# Patient Record
Sex: Female | Born: 1935
Health system: Southern US, Community
[De-identification: ages and names within clinical notes are randomized; demographics above are authoritative.]

## PROBLEM LIST (undated history)

## (undated) DIAGNOSIS — I639 Cerebral infarction, unspecified: Secondary | ICD-10-CM

## (undated) DIAGNOSIS — Z9989 Dependence on other enabling machines and devices: Secondary | ICD-10-CM

## (undated) DIAGNOSIS — G5602 Carpal tunnel syndrome, left upper limb: Secondary | ICD-10-CM

## (undated) DIAGNOSIS — F32A Depression, unspecified: Secondary | ICD-10-CM

## (undated) DIAGNOSIS — G4733 Obstructive sleep apnea (adult) (pediatric): Secondary | ICD-10-CM

## (undated) DIAGNOSIS — Z8781 Personal history of (healed) traumatic fracture: Secondary | ICD-10-CM

## (undated) DIAGNOSIS — F329 Major depressive disorder, single episode, unspecified: Secondary | ICD-10-CM

## (undated) DIAGNOSIS — F0390 Unspecified dementia without behavioral disturbance: Secondary | ICD-10-CM

## (undated) DIAGNOSIS — Z86018 Personal history of other benign neoplasm: Secondary | ICD-10-CM

## (undated) DIAGNOSIS — Z8719 Personal history of other diseases of the digestive system: Secondary | ICD-10-CM

## (undated) DIAGNOSIS — M5090 Cervical disc disorder, unspecified, unspecified cervical region: Secondary | ICD-10-CM

## (undated) DIAGNOSIS — D3A010 Benign carcinoid tumor of the duodenum: Secondary | ICD-10-CM

## (undated) DIAGNOSIS — Z0000 Encounter for general adult medical examination without abnormal findings: Secondary | ICD-10-CM

## (undated) DIAGNOSIS — K5790 Diverticulosis of intestine, part unspecified, without perforation or abscess without bleeding: Secondary | ICD-10-CM

## (undated) DIAGNOSIS — N183 Chronic kidney disease, stage 3 unspecified: Secondary | ICD-10-CM

## (undated) DIAGNOSIS — E1140 Type 2 diabetes mellitus with diabetic neuropathy, unspecified: Secondary | ICD-10-CM

## (undated) DIAGNOSIS — Z973 Presence of spectacles and contact lenses: Secondary | ICD-10-CM

## (undated) DIAGNOSIS — E119 Type 2 diabetes mellitus without complications: Secondary | ICD-10-CM

## (undated) DIAGNOSIS — E785 Hyperlipidemia, unspecified: Secondary | ICD-10-CM

## (undated) DIAGNOSIS — R413 Other amnesia: Secondary | ICD-10-CM

## (undated) DIAGNOSIS — K219 Gastro-esophageal reflux disease without esophagitis: Secondary | ICD-10-CM

## (undated) DIAGNOSIS — F419 Anxiety disorder, unspecified: Secondary | ICD-10-CM

## (undated) DIAGNOSIS — I10 Essential (primary) hypertension: Secondary | ICD-10-CM

## (undated) DIAGNOSIS — M179 Osteoarthritis of knee, unspecified: Secondary | ICD-10-CM

## (undated) DIAGNOSIS — M1710 Unilateral primary osteoarthritis, unspecified knee: Secondary | ICD-10-CM

## (undated) DIAGNOSIS — M810 Age-related osteoporosis without current pathological fracture: Secondary | ICD-10-CM

## (undated) HISTORY — DX: Major depressive disorder, single episode, unspecified: F32.9

## (undated) HISTORY — DX: Benign carcinoid tumor of the duodenum: D3A.010

## (undated) HISTORY — DX: Other amnesia: R41.3

## (undated) HISTORY — DX: Gastro-esophageal reflux disease without esophagitis: K21.9

## (undated) HISTORY — DX: Anxiety disorder, unspecified: F41.9

## (undated) HISTORY — PX: OTHER SURGICAL HISTORY: SHX169

## (undated) HISTORY — DX: Depression, unspecified: F32.A

## (undated) HISTORY — PX: TOTAL KNEE ARTHROPLASTY: SHX125

## (undated) HISTORY — PX: REDUCTION MAMMAPLASTY: SUR839

## (undated) HISTORY — PX: ABDOMINAL HYSTERECTOMY: SHX81

## (undated) HISTORY — PX: TONSILLECTOMY: SUR1361

## (undated) HISTORY — DX: Essential (primary) hypertension: I10

## (undated) HISTORY — DX: Type 2 diabetes mellitus with diabetic neuropathy, unspecified: E11.40

## (undated) HISTORY — DX: Chronic kidney disease, stage 3 unspecified: N18.30

## (undated) HISTORY — DX: Unilateral primary osteoarthritis, unspecified knee: M17.10

## (undated) HISTORY — DX: Diverticulosis of intestine, part unspecified, without perforation or abscess without bleeding: K57.90

## (undated) HISTORY — DX: Cervical disc disorder, unspecified, unspecified cervical region: M50.90

## (undated) HISTORY — DX: Hyperlipidemia, unspecified: E78.5

## (undated) HISTORY — DX: Age-related osteoporosis without current pathological fracture: M81.0

## (undated) HISTORY — DX: Cerebral infarction, unspecified: I63.9

## (undated) HISTORY — DX: Encounter for general adult medical examination without abnormal findings: Z00.00

## (undated) HISTORY — DX: Osteoarthritis of knee, unspecified: M17.9

---
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
## 1988-12-19 HISTORY — PX: NASAL SINUS SURGERY: SHX719

## 1993-12-19 HISTORY — PX: BREAST REDUCTION SURGERY: SHX8

## 1998-04-09 ENCOUNTER — Ambulatory Visit: Admission: RE | Admit: 1998-04-09 | Discharge: 1998-04-09 | Payer: Self-pay | Admitting: Family Medicine

## 1998-10-20 ENCOUNTER — Ambulatory Visit (HOSPITAL_COMMUNITY): Admission: RE | Admit: 1998-10-20 | Discharge: 1998-10-20 | Payer: Self-pay | Admitting: Family Medicine

## 1998-10-20 ENCOUNTER — Encounter: Payer: Self-pay | Admitting: Family Medicine

## 1999-02-13 ENCOUNTER — Emergency Department (HOSPITAL_COMMUNITY): Admission: EM | Admit: 1999-02-13 | Discharge: 1999-02-13 | Payer: Self-pay | Admitting: Emergency Medicine

## 2000-05-02 ENCOUNTER — Encounter: Payer: Self-pay | Admitting: *Deleted

## 2000-05-02 ENCOUNTER — Encounter: Admission: RE | Admit: 2000-05-02 | Discharge: 2000-05-02 | Payer: Self-pay | Admitting: *Deleted

## 2000-10-12 ENCOUNTER — Encounter: Payer: Self-pay | Admitting: Family Medicine

## 2000-10-12 ENCOUNTER — Ambulatory Visit (HOSPITAL_COMMUNITY): Admission: RE | Admit: 2000-10-12 | Discharge: 2000-10-12 | Payer: Self-pay | Admitting: Family Medicine

## 2000-10-30 ENCOUNTER — Encounter: Admission: RE | Admit: 2000-10-30 | Discharge: 2000-10-30 | Payer: Self-pay | Admitting: Family Medicine

## 2000-10-30 ENCOUNTER — Encounter: Payer: Self-pay | Admitting: Family Medicine

## 2001-01-25 ENCOUNTER — Encounter: Payer: Self-pay | Admitting: Family Medicine

## 2001-05-04 ENCOUNTER — Encounter: Admission: RE | Admit: 2001-05-04 | Discharge: 2001-05-04 | Payer: Self-pay | Admitting: *Deleted

## 2001-05-04 ENCOUNTER — Encounter: Payer: Self-pay | Admitting: *Deleted

## 2001-06-04 ENCOUNTER — Other Ambulatory Visit: Admission: RE | Admit: 2001-06-04 | Discharge: 2001-06-04 | Payer: Self-pay

## 2002-02-01 ENCOUNTER — Encounter: Payer: Self-pay | Admitting: Internal Medicine

## 2002-05-10 ENCOUNTER — Encounter: Payer: Self-pay | Admitting: *Deleted

## 2002-05-10 ENCOUNTER — Encounter: Admission: RE | Admit: 2002-05-10 | Discharge: 2002-05-10 | Payer: Self-pay | Admitting: *Deleted

## 2003-05-26 ENCOUNTER — Encounter: Admission: RE | Admit: 2003-05-26 | Discharge: 2003-05-26 | Payer: Self-pay | Admitting: *Deleted

## 2003-05-26 ENCOUNTER — Encounter: Payer: Self-pay | Admitting: *Deleted

## 2003-06-10 ENCOUNTER — Encounter: Admission: RE | Admit: 2003-06-10 | Discharge: 2003-09-08 | Payer: Self-pay | Admitting: Family Medicine

## 2004-06-08 ENCOUNTER — Encounter: Admission: RE | Admit: 2004-06-08 | Discharge: 2004-06-08 | Payer: Self-pay | Admitting: Family Medicine

## 2004-10-19 ENCOUNTER — Ambulatory Visit: Payer: Self-pay | Admitting: Physical Medicine & Rehabilitation

## 2004-10-19 ENCOUNTER — Inpatient Hospital Stay (HOSPITAL_COMMUNITY): Admission: RE | Admit: 2004-10-19 | Discharge: 2004-10-22 | Payer: Self-pay | Admitting: Orthopaedic Surgery

## 2004-10-22 ENCOUNTER — Inpatient Hospital Stay
Admission: RE | Admit: 2004-10-22 | Discharge: 2004-10-27 | Payer: Self-pay | Admitting: Physical Medicine & Rehabilitation

## 2005-07-22 ENCOUNTER — Encounter: Admission: RE | Admit: 2005-07-22 | Discharge: 2005-07-22 | Payer: Self-pay | Admitting: Family Medicine

## 2005-08-05 ENCOUNTER — Encounter: Admission: RE | Admit: 2005-08-05 | Discharge: 2005-08-05 | Payer: Self-pay | Admitting: Family Medicine

## 2005-12-21 ENCOUNTER — Encounter: Admission: RE | Admit: 2005-12-21 | Discharge: 2005-12-21 | Payer: Self-pay | Admitting: Family Medicine

## 2006-01-03 ENCOUNTER — Ambulatory Visit (HOSPITAL_BASED_OUTPATIENT_CLINIC_OR_DEPARTMENT_OTHER): Admission: RE | Admit: 2006-01-03 | Discharge: 2006-01-03 | Payer: Self-pay | Admitting: Orthopaedic Surgery

## 2006-01-03 HISTORY — PX: KNEE ARTHROSCOPY W/ MENISCECTOMY: SHX1879

## 2006-03-30 ENCOUNTER — Encounter: Admission: RE | Admit: 2006-03-30 | Discharge: 2006-03-30 | Payer: Self-pay | Admitting: Family Medicine

## 2006-09-12 ENCOUNTER — Encounter: Admission: RE | Admit: 2006-09-12 | Discharge: 2007-06-25 | Payer: Self-pay | Admitting: Family Medicine

## 2006-11-29 ENCOUNTER — Encounter: Payer: Self-pay | Admitting: Family Medicine

## 2006-11-29 LAB — CONVERTED CEMR LAB: TSH: 1.54 microintl units/mL

## 2007-02-23 ENCOUNTER — Encounter: Payer: Self-pay | Admitting: Family Medicine

## 2007-02-23 LAB — CONVERTED CEMR LAB
AST: 24 units/L
Alkaline Phosphatase: 79 units/L
CO2: 33 meq/L
Chloride: 101 meq/L
Cholesterol: 163 mg/dL
Creatinine, Ser: 1 mg/dL
Glucose, Bld: 96 mg/dL
Microalb, Ur: NEGATIVE mg/dL

## 2007-06-25 ENCOUNTER — Emergency Department (HOSPITAL_COMMUNITY): Admission: EM | Admit: 2007-06-25 | Discharge: 2007-06-25 | Payer: Self-pay | Admitting: Emergency Medicine

## 2007-07-04 ENCOUNTER — Encounter: Payer: Self-pay | Admitting: Family Medicine

## 2007-07-05 ENCOUNTER — Ambulatory Visit: Payer: Self-pay | Admitting: Family Medicine

## 2007-07-05 DIAGNOSIS — I10 Essential (primary) hypertension: Secondary | ICD-10-CM | POA: Insufficient documentation

## 2007-07-06 ENCOUNTER — Encounter: Payer: Self-pay | Admitting: Family Medicine

## 2007-07-10 ENCOUNTER — Telehealth: Payer: Self-pay | Admitting: Family Medicine

## 2007-07-11 ENCOUNTER — Encounter: Payer: Self-pay | Admitting: Family Medicine

## 2007-07-11 DIAGNOSIS — G43009 Migraine without aura, not intractable, without status migrainosus: Secondary | ICD-10-CM | POA: Insufficient documentation

## 2007-07-16 ENCOUNTER — Ambulatory Visit: Payer: Self-pay | Admitting: Family Medicine

## 2007-07-16 DIAGNOSIS — F329 Major depressive disorder, single episode, unspecified: Secondary | ICD-10-CM | POA: Insufficient documentation

## 2007-08-06 ENCOUNTER — Ambulatory Visit: Payer: Self-pay | Admitting: Family Medicine

## 2007-08-07 ENCOUNTER — Telehealth: Payer: Self-pay | Admitting: Family Medicine

## 2007-08-07 ENCOUNTER — Telehealth (INDEPENDENT_AMBULATORY_CARE_PROVIDER_SITE_OTHER): Payer: Self-pay | Admitting: *Deleted

## 2007-08-07 ENCOUNTER — Encounter: Payer: Self-pay | Admitting: Family Medicine

## 2007-08-07 LAB — CONVERTED CEMR LAB: Vitamin B-12: 720 pg/mL (ref 211–911)

## 2007-08-16 ENCOUNTER — Ambulatory Visit: Payer: Self-pay | Admitting: Internal Medicine

## 2007-09-17 ENCOUNTER — Encounter: Payer: Self-pay | Admitting: Family Medicine

## 2007-09-17 ENCOUNTER — Ambulatory Visit: Payer: Self-pay | Admitting: Internal Medicine

## 2007-09-17 ENCOUNTER — Encounter: Payer: Self-pay | Admitting: Internal Medicine

## 2007-09-24 ENCOUNTER — Ambulatory Visit: Payer: Self-pay | Admitting: Family Medicine

## 2007-09-24 DIAGNOSIS — M5030 Other cervical disc degeneration, unspecified cervical region: Secondary | ICD-10-CM | POA: Insufficient documentation

## 2007-09-24 DIAGNOSIS — K219 Gastro-esophageal reflux disease without esophagitis: Secondary | ICD-10-CM | POA: Insufficient documentation

## 2007-09-24 DIAGNOSIS — Z96659 Presence of unspecified artificial knee joint: Secondary | ICD-10-CM | POA: Insufficient documentation

## 2007-09-26 ENCOUNTER — Encounter: Payer: Self-pay | Admitting: Family Medicine

## 2007-10-01 ENCOUNTER — Encounter: Admission: RE | Admit: 2007-10-01 | Discharge: 2007-11-03 | Payer: Self-pay | Admitting: Family Medicine

## 2007-10-09 ENCOUNTER — Telehealth: Payer: Self-pay | Admitting: Family Medicine

## 2007-10-15 ENCOUNTER — Telehealth: Payer: Self-pay | Admitting: Family Medicine

## 2007-10-24 ENCOUNTER — Encounter: Payer: Self-pay | Admitting: Family Medicine

## 2007-10-26 ENCOUNTER — Ambulatory Visit: Payer: Self-pay | Admitting: Family Medicine

## 2007-10-31 ENCOUNTER — Inpatient Hospital Stay (HOSPITAL_COMMUNITY): Admission: RE | Admit: 2007-10-31 | Discharge: 2007-11-05 | Payer: Self-pay | Admitting: Orthopedic Surgery

## 2007-11-03 ENCOUNTER — Encounter: Payer: Self-pay | Admitting: Family Medicine

## 2007-11-06 ENCOUNTER — Telehealth: Payer: Self-pay | Admitting: Family Medicine

## 2007-11-16 ENCOUNTER — Ambulatory Visit: Payer: Self-pay | Admitting: Family Medicine

## 2007-11-17 LAB — CONVERTED CEMR LAB: TSH: 1.668 microintl units/mL (ref 0.350–5.50)

## 2007-11-19 ENCOUNTER — Telehealth: Payer: Self-pay | Admitting: Family Medicine

## 2007-11-20 ENCOUNTER — Encounter: Admission: RE | Admit: 2007-11-20 | Discharge: 2007-11-20 | Payer: Self-pay | Admitting: Family Medicine

## 2007-11-22 DIAGNOSIS — E041 Nontoxic single thyroid nodule: Secondary | ICD-10-CM | POA: Insufficient documentation

## 2007-11-27 ENCOUNTER — Ambulatory Visit: Payer: Self-pay | Admitting: Family Medicine

## 2007-11-29 LAB — CONVERTED CEMR LAB
BUN: 24 mg/dL — ABNORMAL HIGH (ref 6–23)
Creatinine, Ser: 1.08 mg/dL (ref 0.40–1.20)
MCV: 88.8 fL (ref 78.0–100.0)
Platelets: 334 10*3/uL (ref 150–400)
Potassium: 4.5 meq/L (ref 3.5–5.3)
RDW: 14.5 % (ref 11.5–15.5)
Sodium: 144 meq/L (ref 135–145)

## 2007-12-07 ENCOUNTER — Ambulatory Visit: Payer: Self-pay | Admitting: Cardiology

## 2007-12-20 ENCOUNTER — Ambulatory Visit: Payer: Self-pay | Admitting: Cardiology

## 2007-12-21 ENCOUNTER — Ambulatory Visit: Payer: Self-pay

## 2007-12-21 ENCOUNTER — Encounter: Payer: Self-pay | Admitting: Cardiology

## 2007-12-21 ENCOUNTER — Encounter: Payer: Self-pay | Admitting: Family Medicine

## 2007-12-21 HISTORY — PX: TRANSTHORACIC ECHOCARDIOGRAM: SHX275

## 2007-12-21 HISTORY — PX: CARDIOVASCULAR STRESS TEST: SHX262

## 2008-01-07 ENCOUNTER — Telehealth (INDEPENDENT_AMBULATORY_CARE_PROVIDER_SITE_OTHER): Payer: Self-pay | Admitting: *Deleted

## 2008-01-09 ENCOUNTER — Ambulatory Visit: Payer: Self-pay | Admitting: Cardiology

## 2008-01-10 ENCOUNTER — Encounter: Payer: Self-pay | Admitting: Family Medicine

## 2008-01-17 ENCOUNTER — Ambulatory Visit: Payer: Self-pay | Admitting: Family Medicine

## 2008-01-17 DIAGNOSIS — E785 Hyperlipidemia, unspecified: Secondary | ICD-10-CM | POA: Insufficient documentation

## 2008-02-17 ENCOUNTER — Encounter: Payer: Self-pay | Admitting: Family Medicine

## 2008-03-05 ENCOUNTER — Encounter: Payer: Self-pay | Admitting: Family Medicine

## 2008-03-05 LAB — CONVERTED CEMR LAB
AST: 19 units/L (ref 0–37)
Albumin: 4.8 g/dL (ref 3.5–5.2)
BUN: 18 mg/dL (ref 6–23)
CO2: 25 meq/L (ref 19–32)
Creatinine, Ser: 0.87 mg/dL (ref 0.40–1.20)
Glucose, Bld: 106 mg/dL — ABNORMAL HIGH (ref 70–99)
LDL Cholesterol: 99 mg/dL (ref 0–99)
Total Bilirubin: 0.8 mg/dL (ref 0.3–1.2)
Total Protein: 7 g/dL (ref 6.0–8.3)

## 2008-03-06 ENCOUNTER — Encounter: Payer: Self-pay | Admitting: Family Medicine

## 2008-03-18 DIAGNOSIS — D131 Benign neoplasm of stomach: Secondary | ICD-10-CM | POA: Insufficient documentation

## 2008-04-08 ENCOUNTER — Ambulatory Visit: Payer: Self-pay | Admitting: Family Medicine

## 2008-06-03 ENCOUNTER — Ambulatory Visit: Payer: Self-pay | Admitting: Family Medicine

## 2008-06-05 ENCOUNTER — Ambulatory Visit: Payer: Self-pay | Admitting: Family Medicine

## 2008-09-04 ENCOUNTER — Ambulatory Visit: Payer: Self-pay | Admitting: Family Medicine

## 2008-09-15 ENCOUNTER — Encounter: Admission: RE | Admit: 2008-09-15 | Discharge: 2008-09-15 | Payer: Self-pay | Admitting: Family Medicine

## 2008-09-16 ENCOUNTER — Ambulatory Visit: Payer: Self-pay | Admitting: Family Medicine

## 2008-09-16 LAB — CONVERTED CEMR LAB
Bilirubin Urine: NEGATIVE
Glucose, Urine, Semiquant: NEGATIVE
Nitrite: NEGATIVE
WBC Urine, dipstick: NEGATIVE
pH: 5

## 2008-09-17 ENCOUNTER — Encounter: Payer: Self-pay | Admitting: Family Medicine

## 2009-01-22 ENCOUNTER — Ambulatory Visit: Payer: Self-pay | Admitting: Family Medicine

## 2009-01-22 LAB — CONVERTED CEMR LAB: Hgb A1c MFr Bld: 5.9 %

## 2009-03-05 ENCOUNTER — Ambulatory Visit: Payer: Self-pay | Admitting: Family Medicine

## 2009-03-05 LAB — CONVERTED CEMR LAB
Blood Glucose, Fasting: 142 mg/dL
Hgb A1c MFr Bld: 6 %

## 2009-03-06 ENCOUNTER — Encounter: Payer: Self-pay | Admitting: Family Medicine

## 2009-03-09 LAB — CONVERTED CEMR LAB
AST: 24 units/L (ref 0–37)
BUN: 20 mg/dL (ref 6–23)
CO2: 26 meq/L (ref 19–32)
Cholesterol: 166 mg/dL (ref 0–200)
Creatinine, Ser: 0.91 mg/dL (ref 0.40–1.20)
Glucose, Bld: 113 mg/dL — ABNORMAL HIGH (ref 70–99)
LDL Cholesterol: 94 mg/dL (ref 0–99)
MCV: 87.7 fL (ref 78.0–100.0)
Sodium: 144 meq/L (ref 135–145)
Total CHOL/HDL Ratio: 3
Total Protein: 6.7 g/dL (ref 6.0–8.3)
Triglycerides: 83 mg/dL (ref <150)

## 2009-04-22 ENCOUNTER — Ambulatory Visit: Payer: Self-pay | Admitting: Family Medicine

## 2009-04-22 DIAGNOSIS — M5430 Sciatica, unspecified side: Secondary | ICD-10-CM | POA: Insufficient documentation

## 2009-04-22 LAB — CONVERTED CEMR LAB: Blood Glucose, AC Bkfst: 136 mg/dL

## 2009-04-23 ENCOUNTER — Encounter: Admission: RE | Admit: 2009-04-23 | Discharge: 2009-04-23 | Payer: Self-pay | Admitting: Sports Medicine

## 2009-04-28 ENCOUNTER — Telehealth (INDEPENDENT_AMBULATORY_CARE_PROVIDER_SITE_OTHER): Payer: Self-pay | Admitting: *Deleted

## 2009-05-27 ENCOUNTER — Ambulatory Visit: Payer: Self-pay | Admitting: Family Medicine

## 2009-06-02 ENCOUNTER — Ambulatory Visit: Payer: Self-pay | Admitting: Family Medicine

## 2009-06-02 DIAGNOSIS — E119 Type 2 diabetes mellitus without complications: Secondary | ICD-10-CM | POA: Insufficient documentation

## 2009-06-10 ENCOUNTER — Telehealth (INDEPENDENT_AMBULATORY_CARE_PROVIDER_SITE_OTHER): Payer: Self-pay | Admitting: *Deleted

## 2009-06-24 ENCOUNTER — Telehealth (INDEPENDENT_AMBULATORY_CARE_PROVIDER_SITE_OTHER): Payer: Self-pay | Admitting: *Deleted

## 2009-06-25 ENCOUNTER — Encounter: Admission: RE | Admit: 2009-06-25 | Discharge: 2009-06-25 | Payer: Self-pay | Admitting: Sports Medicine

## 2009-07-09 ENCOUNTER — Encounter: Admission: RE | Admit: 2009-07-09 | Discharge: 2009-07-09 | Payer: Self-pay | Admitting: Sports Medicine

## 2009-07-20 ENCOUNTER — Telehealth: Payer: Self-pay | Admitting: Family Medicine

## 2009-08-03 ENCOUNTER — Telehealth (INDEPENDENT_AMBULATORY_CARE_PROVIDER_SITE_OTHER): Payer: Self-pay | Admitting: *Deleted

## 2009-08-04 ENCOUNTER — Telehealth (INDEPENDENT_AMBULATORY_CARE_PROVIDER_SITE_OTHER): Payer: Self-pay | Admitting: *Deleted

## 2009-08-12 ENCOUNTER — Encounter: Admission: RE | Admit: 2009-08-12 | Discharge: 2009-09-25 | Payer: Self-pay | Admitting: Sports Medicine

## 2009-08-26 ENCOUNTER — Encounter: Payer: Self-pay | Admitting: Family Medicine

## 2009-09-04 ENCOUNTER — Ambulatory Visit: Payer: Self-pay | Admitting: Family Medicine

## 2009-09-05 ENCOUNTER — Encounter: Payer: Self-pay | Admitting: Family Medicine

## 2009-09-07 LAB — CONVERTED CEMR LAB
Creatinine, Urine: 180.1 mg/dL
Ferritin: 112 ng/mL (ref 10–291)
Folate: >20 ng/mL
Hgb A1c MFr Bld: 6.5 % — ABNORMAL HIGH (ref 4.6–6.1)
Microalb, Ur: 1.06 mg/dL (ref 0.00–1.89)
RDW: 15.1 % (ref 11.5–15.5)

## 2009-10-06 ENCOUNTER — Telehealth: Payer: Self-pay | Admitting: Family Medicine

## 2009-10-08 ENCOUNTER — Ambulatory Visit: Payer: Self-pay | Admitting: Family Medicine

## 2009-10-27 ENCOUNTER — Encounter: Admission: RE | Admit: 2009-10-27 | Discharge: 2009-10-27 | Payer: Self-pay | Admitting: Family Medicine

## 2009-10-28 LAB — HM MAMMOGRAPHY: HM Mammogram: NORMAL

## 2009-11-06 ENCOUNTER — Ambulatory Visit: Payer: Self-pay | Admitting: Family Medicine

## 2010-01-06 ENCOUNTER — Ambulatory Visit: Payer: Self-pay | Admitting: Family Medicine

## 2010-01-06 LAB — CONVERTED CEMR LAB: Hgb A1c MFr Bld: 6.1 %

## 2010-01-13 ENCOUNTER — Ambulatory Visit (HOSPITAL_COMMUNITY): Payer: Self-pay | Admitting: Psychiatry

## 2010-04-12 ENCOUNTER — Ambulatory Visit: Payer: Self-pay | Admitting: Family Medicine

## 2010-04-12 DIAGNOSIS — R5383 Other fatigue: Secondary | ICD-10-CM

## 2010-04-12 DIAGNOSIS — R5381 Other malaise: Secondary | ICD-10-CM | POA: Insufficient documentation

## 2010-04-13 LAB — CONVERTED CEMR LAB
AST: 22 units/L (ref 0–37)
Alkaline Phosphatase: 85 units/L (ref 39–117)
Basophils Relative: 0 % (ref 0–1)
CO2: 27 meq/L (ref 19–32)
Calcium: 9.9 mg/dL (ref 8.4–10.5)
Chloride: 104 meq/L (ref 96–112)
Cholesterol: 140 mg/dL (ref 0–200)
Eosinophils Absolute: 0.3 10*3/uL (ref 0.0–0.7)
Eosinophils Relative: 3 % (ref 0–5)
Glucose, Bld: 118 mg/dL — ABNORMAL HIGH (ref 70–99)
HCT: 41.2 % (ref 36.0–46.0)
HDL: 54 mg/dL (ref >39)
Hemoglobin: 13.1 g/dL (ref 12.0–15.0)
LDL Cholesterol: 72 mg/dL (ref 0–99)
MCV: 89 fL (ref 78.0–100.0)
Monocytes Absolute: 0.6 10*3/uL (ref 0.1–1.0)
Platelets: 243 10*3/uL (ref 150–400)
Potassium: 3.9 meq/L (ref 3.5–5.3)
RBC: 4.63 M/uL (ref 3.87–5.11)
RDW: 14.2 % (ref 11.5–15.5)
Total Bilirubin: 0.8 mg/dL (ref 0.3–1.2)
Total CHOL/HDL Ratio: 2.6
Triglycerides: 72 mg/dL (ref <150)
VLDL: 14 mg/dL (ref 0–40)
WBC: 7.9 10*3/uL (ref 4.0–10.5)

## 2010-08-10 ENCOUNTER — Ambulatory Visit: Payer: Self-pay | Admitting: Family Medicine

## 2010-08-24 LAB — HM DIABETES EYE EXAM

## 2010-09-08 ENCOUNTER — Encounter: Payer: Self-pay | Admitting: Family Medicine

## 2010-10-13 ENCOUNTER — Ambulatory Visit: Payer: Self-pay | Admitting: Family Medicine

## 2010-12-03 ENCOUNTER — Ambulatory Visit: Payer: Self-pay | Admitting: Family Medicine

## 2010-12-17 LAB — CONVERTED CEMR LAB: Albumin/Creatinine Ratio, Urine, POC: <30

## 2011-01-09 ENCOUNTER — Encounter: Payer: Self-pay | Admitting: General Surgery

## 2011-01-09 ENCOUNTER — Encounter: Payer: Self-pay | Admitting: Family Medicine

## 2011-01-10 ENCOUNTER — Encounter: Payer: Self-pay | Admitting: Family Medicine

## 2011-01-10 ENCOUNTER — Encounter: Payer: Self-pay | Admitting: Sports Medicine

## 2011-01-18 NOTE — Assessment & Plan Note (Signed)
Summary: f/u diabetes   Vital Signs:  Patient profile:   75 year old female Height:      65.25 inches Weight:      184 pounds BMI:     30.49 O2 Sat:      96 % on Room air Temp:     98.3 degrees F oral Pulse rate:   102 / minute BP sitting:   128 / 77  (right arm) Cuff size:   large  Vitals Entered By: Payton Spark CMA (January 06, 2010 11:23 AM)  O2 Flow:  Room air CC: F/U DM   Primary Care Provider:  Seymour Bars DO  CC:  F/U DM.  History of Present Illness: 75 yo WF presents for T2DM diagnosed 05-2009.  She is on Metformin 500 mg in the AM.  Her fasting sugars are running in the 90s.  Her diet has improved and she lost 8 lbs (due to added stress w/ her sister's failing health).  Her mood has improved with higher dose of Wellbutrin.  She has been getting out of the house more and is doing less emotional eating.     Allergies: 1)  Ace Inhibitors 2)  Actos (Pioglitazone Hcl)  Past History:  Past Medical History: HTN DM 05-2009 high cholestrol L knee OA- DR Yisroel Ramming EKG 2/05 DXA 10/01 G2P2 depression GERD (Dr Juanda Chance), hiatal hernia Mitral valve regurg Diverticulosisis asthma cervical disc dz pernicious anemia OSA-- non compliant with CPAP diabetic neuropathy optho: Dr Jen Mow ([facility]) cspine radiculopathy IBS  Past Surgical History: Reviewed history from 03/18/2008 and no changes required. Hysterectomy for fibroids breast reduction G2P2 R knee replacement 11/05- Dr Yisroel Ramming L TKR Tonsillectomy Appendectomy Sinus surgery  Social History: Reviewed history from 07/05/2007 and no changes required. Retired from [employer].  Her younger sister with MS lives with her.   2 sons, one in [place], 1 in Paediatric nurse.  Never smoked.  Denies ETOH. no exercise due to knee pain.  HS diploma.  Divorced, no relationship.  Review of Systems      See HPI  Physical Exam  General:  alert, well-developed, well-nourished, well-hydrated, and overweight-appearing.     Head:  normocephalic and atraumatic.   Eyes:  pupils equal, pupils round, and pupils reactive to light.   Nose:  no nasal discharge.   Mouth:  pharynx pink and moist.   Neck:  no masses.   Lungs:  Normal respiratory effort, chest expands symmetrically. Lungs are clear to auscultation, no crackles or wheezes. Heart:  2/6 systolic murmurnormal rate and regular rhythm.   Extremities:  trace LE edema Skin:  color normal.   Cervical Nodes:  No lymphadenopathy noted Psych:  in better spirits  Diabetes Management Exam:    Foot Exam (with socks and/or shoes not present):       Sensory-Pinprick/Light touch:          Left medial foot (L-4): absent          Left dorsal foot (L-5): absent          Left lateral foot (S-1): absent          Right medial foot (L-4): absent          Right dorsal foot (L-5): absent          Right lateral foot (S-1): absent       Sensory-Monofilament:          Left foot: absent          Right foot: absent  Impression & Recommendations:  Problem # 1:  DIABETES MELLITUS, TYPE II, CONTROLLED (ICD-250.00) A1C perfect.  Continue current meds, working on diabetic diet, exercise, wt loss.  Stay on metformin.  FLP, CMP due after March. Her updated medication list for this problem includes:    Benicar Hct 40-12.5 Mg Tabs (Olmesartan medoxomil-hctz) .Marland Kitchen... 1 tab by mouth daily    Adult Aspirin Low Strength 81 Mg Tbdp (Aspirin) .Marland Kitchen... Take 1 tablet by mouth once a day    Metformin Hcl 500 Mg Tabs (Metformin hcl) .Marland Kitchen... Take 1 tablet by mouth once a day in the am.  Orders: Fingerstick (04540) Hemoglobin A1C (83036)  Labs Reviewed: Creat: 0.91 (03/06/2009)     Last Eye Exam: no retinopathy Dr Weston Anna (08/19/2009) Reviewed HgBA1c results: 6.1 (01/06/2010)  6.5 (09/05/2009)  Problem # 2:  DEPRESSION, RECURRENT (ICD-311) Assessment: Improved Symptomatically improved, losing wt and being more active despite home stressors w/ her sister. Continue current treatment  plan. Her updated medication list for this problem includes:    Cymbalta 60 Mg Cpep (Duloxetine hcl) .Marland Kitchen... 1 tab by mouth daily    Alprazolam 0.25 Mg Tabs (Alprazolam) .Marland Kitchen... 1 tab by mouth two times a day as needed anxiety    Wellbutrin Xl 300 Mg Xr24h-tab (Bupropion hcl) .Marland Kitchen... 1 tab by mouth qam  Orders: Psychology Referral (Psychology)  Problem # 3:  HYPERTENSION, BENIGN ESSENTIAL (ICD-401.1) BP at goal.  Continue current meds.  Labs due next visit. Her updated medication list for this problem includes:    Benicar Hct 40-12.5 Mg Tabs (Olmesartan medoxomil-hctz) .Marland Kitchen... 1 tab by mouth daily    Norvasc 5 Mg Tabs (Amlodipine besylate) .Marland Kitchen... 1 tab by mouth daily  BP today: 128/77 Prior BP: 127/73 (11/06/2009)  Prior 10 Yr Risk Heart Disease: 8 % (09/04/2008)  Labs Reviewed: K+: 4.8 (03/06/2009) Creat: : 0.91 (03/06/2009)   Chol: 166 (03/06/2009)   HDL: 55 (03/06/2009)   LDL: 94 (03/06/2009)   TG: 83 (03/06/2009)  Complete Medication List: 1)  Lipitor 20 Mg Tabs (Atorvastatin calcium) .... Take 1 tablet by mouth once a day 2)  Seroquel 100 Mg Tabs (Quetiapine fumarate) .... 0.5 tab by mouth qhs 3)  Cymbalta 60 Mg Cpep (Duloxetine hcl) .Marland Kitchen.. 1 tab by mouth daily 4)  Alprazolam 0.25 Mg Tabs (Alprazolam) .Marland Kitchen.. 1 tab by mouth two times a day as needed anxiety 5)  Benicar Hct 40-12.5 Mg Tabs (Olmesartan medoxomil-hctz) .Marland Kitchen.. 1 tab by mouth daily 6)  Adult Aspirin Low Strength 81 Mg Tbdp (Aspirin) .... Take 1 tablet by mouth once a day 7)  Celebrex 200 Mg Caps (Celecoxib) .Marland Kitchen.. 1 tab by mouth once daily with food 8)  Norvasc 5 Mg Tabs (Amlodipine besylate) .Marland Kitchen.. 1 tab by mouth daily 9)  Metanx 2.8-25-2 Mg Tabs (L-methylfolate-b6-b12) .... 2 tabs by mouth daily 10)  Metformin Hcl 500 Mg Tabs (Metformin hcl) .... Take 1 tablet by mouth once a day in the am. 11)  Prilosec Otc 20 Mg Tbec (Omeprazole magnesium) .Marland Kitchen.. 1 tab by mouth daily 12)  Wellbutrin Xl 300 Mg Xr24h-tab (Bupropion hcl) .Marland Kitchen.. 1 tab by  mouth qam  Patient Instructions: 1)  Counseling referral added for downstairs. 2)  Stay on current meds. 3)  Sugars look great!  Keep working on diabetic diet, adding exercise.   4)  Recheck in 3 months. Prescriptions: NORVASC 5 MG  TABS (AMLODIPINE BESYLATE) 1 tab by mouth daily  #30 x 6   Entered and Authorized by:   Seymour Bars DO  Signed by:   Seymour Bars DO on 01/06/2010   Method used:   [facility] to        [id number]       [address]       [place], [place]  [address]       Ph: [phone] or [phone]       Fax: [phone]   RxID:   [id number] CYMBALTA 60 MG  CPEP (DULOXETINE HCL) 1 tab by mouth daily  #30 x 6   Entered and Authorized by:   Seymour Bars DO   Signed by:   Seymour Bars DO on 01/06/2010   Method used:   [facility] to        [id number]       [address] [place], [place]  [address]       Ph: [phone] or [phone]       Fax: [phone]   RxID:   [id number] METFORMIN HCL 500 MG TABS (METFORMIN HCL) Take 1 tablet by mouth once a day in the AM.  #30 x 6   Entered and Authorized by:   Seymour Bars DO   Signed by:   Seymour Bars DO on 01/06/2010   Method used:   [facility]        [id number]       [address] [place], [place]  [address]       Ph: [phone] or [phone]       Fax: [phone]   RxID:   [id number] WELLBUTRIN XL 300 MG XR24H-TAB (BUPROPION HCL) 1 tab by mouth qAM  #30 x 3   Entered and Authorized by:   Seymour Bars DO   Signed by:   Seymour Bars DO on 01/06/2010   Method used:   [facility]        [id number]       [address] [place], [place]  [address]       Ph: [phone] or [phone]       Fax: [phone]   RxID:   [id number]   Laboratory Results   Blood Tests     HGBA1C: 6.1%   (Normal Range: Non-Diabetic - 3-6%   Control Diabetic  - 6-8%)

## 2011-01-18 NOTE — Assessment & Plan Note (Signed)
Summary: flu shot - jr  Nurse Visit   Allergies: 1)  ! Wellbutrin 2)  Ace Inhibitors 3)  Actos (Pioglitazone Hcl)  Immunizations Administered:  Influenza Vaccine # 1:    Vaccine Type: Fluvax MCR    Site: right deltoid    Mfr: GlaxoSmithKline    Dose: 0.5 ml    Route: IM    Given by: Sue Lush McCrimmon CMA, (AAMA)    Exp. Date: 06/18/2011    Lot #: [id number]    VIS given: 07/13/10 version given October 13, 2010.  Flu Vaccine Consent Questions:    Do you have a history of severe allergic reactions to this vaccine? no    Any prior history of allergic reactions to egg and/or gelatin? no    Do you have a sensitivity to the preservative Thimersol? no    Do you have a past history of Guillan-Barre Syndrome? no    Do you currently have an acute febrile illness? no    Have you ever had a severe reaction to latex? no    Vaccine information given and explained to patient? no    Are you currently pregnant? no  Orders Added: 1)  Influenza Vaccine MCR [00025] 2)  Admin 1st Vaccine [[address]]

## 2011-01-18 NOTE — Assessment & Plan Note (Signed)
Summary: f/u DM   Vital Signs:  Patient profile:   75 year old female Height:      65.25 inches Weight:      183 pounds BMI:     30.33 O2 Sat:      96 % on Room air Pulse rate:   98 / minute BP sitting:   120 / 65  (left arm) Cuff size:   large  Vitals Entered By: Payton Spark CMA (August 10, 2010 10:19 AM)  O2 Flow:  Room air CC: 4 mo f/u DM   Primary Care Provider:  Seymour Bars DO  CC:  4 mo f/u DM.  History of Present Illness: 75 yo WF presents for f/u T2DM.  She was increased on her Metformin to 500 mg two times a day from once a day.  She is feeling better.  Her mood has improved with adding Vit D.  She is taking all of her anti depressants and feels well on them. She took a vacation and says that this boosted her mood.  Her A1C has improved from 6.4--> 6.2.  Her eye exam is due in Sept.  Her AM sugars are in the low 110s.  Denies any low sugars.  She has fair control of her diet.  Denies paresthesias but has a hx of neuropathy.  She denies need for RF on meds or any problems iwth chest pain or SOB.      Current Medications (verified): 1)  Lipitor 20 Mg  Tabs (Atorvastatin Calcium) .... Take 1 Tablet By Mouth Once A Day 2)  Seroquel 50 Mg Tabs (Quetiapine Fumarate) .Marland Kitchen.. 1 Tab By Mouth Qpm 3)  Cymbalta 60 Mg  Cpep (Duloxetine Hcl) .Marland Kitchen.. 1 Tab By Mouth Daily 4)  Alprazolam 0.25 Mg  Tabs (Alprazolam) .Marland Kitchen.. 1 Tab By Mouth Two Times A Day As Needed Anxiety 5)  Benicar Hct 40-12.5 Mg Tabs (Olmesartan Medoxomil-Hctz) .Marland Kitchen.. 1 Tab By Mouth Daily 6)  Adult Aspirin Low Strength 81 Mg  Tbdp (Aspirin) .... Take 1 Tablet By Mouth Once A Day 7)  Norvasc 5 Mg  Tabs (Amlodipine Besylate) .Marland Kitchen.. 1 Tab By Mouth Daily 8)  Metanx 2.8-25-2 Mg Tabs (L-Methylfolate-B6-B12) .... 2 Tabs By Mouth Daily 9)  Prilosec Otc 20 Mg Tbec (Omeprazole Magnesium) .Marland Kitchen.. 1 Tab By Mouth Daily 10)  Vitamin D  Allergies (verified): 1)  ! Wellbutrin 2)  Ace Inhibitors 3)  Actos (Pioglitazone Hcl)  Past  History:  Past Medical History: Reviewed history from 01/06/2010 and no changes required. HTN DM 05-2009 high cholestrol L knee OA- DR Yisroel Ramming EKG 2/05 DXA 10/01 G2P2 depression GERD (Dr Juanda Chance), hiatal hernia Mitral valve regurg Diverticulosisis asthma cervical disc dz pernicious anemia OSA-- non compliant with CPAP diabetic neuropathy optho: Dr Jen Mow ([facility]) cspine radiculopathy IBS  Past Surgical History: Reviewed history from 03/18/2008 and no changes required. Hysterectomy for fibroids breast reduction G2P2 R knee replacement 11/05- Dr Yisroel Ramming L TKR Tonsillectomy Appendectomy Sinus surgery  Social History: Reviewed history from 07/05/2007 and no changes required. Retired from [employer].  Her younger sister with MS lives with her.   2 sons, one in [place], 1 in Paediatric nurse.  Never smoked.  Denies ETOH. no exercise due to knee pain.  HS diploma.  Divorced, no relationship.  Review of Systems      See HPI  Physical Exam  General:  alert, well-developed, well-nourished, and well-hydrated.   Head:  normocephalic and atraumatic.   Eyes:  pupils equal, pupils round, and pupils reactive  to light.   Mouth:  pharynx pink and moist.   Neck:  no masses.   Lungs:  Normal respiratory effort, chest expands symmetrically. Lungs are clear to auscultation, no crackles or wheezes. Heart:  2/6 systolic murmurnormal rate and regular rhythm.   Extremities:  no LE edema Skin:  color normal.   Cervical Nodes:  No lymphadenopathy noted Psych:  good eye contact, not anxious appearing, and not depressed appearing.     Impression & Recommendations:  Problem # 1:  DIABETES MELLITUS, TYPE II, CONTROLLED (ICD-250.00) Well controlled with A1C of 6.2.  Continue Metformin at 500 mg two times a day.  Labs updated in April 2011.  Umicro, monofilament and eye exam all UTD.  Will RTC for a flu shot in October and f/u with me in 4 mos.l The following medications were removed from  the medication list:    Metformin Hcl 500 Mg Tabs (Metformin hcl) .Marland Kitchen... 1 tab by mouth bid Her updated medication list for this problem includes:    Benicar Hct 40-12.5 Mg Tabs (Olmesartan medoxomil-hctz) .Marland Kitchen... 1 tab by mouth daily    Adult Aspirin Low Strength 81 Mg Tbdp (Aspirin) .Marland Kitchen... Take 1 tablet by mouth once a day  Orders: Fingerstick (36416) Hgb A1C (04540JW)  Problem # 2:  DEPRESSION, RECURRENT (ICD-311) Assessment: Improved She seems much brighter today and reports the best mood she's had in a while.  Continue current meds.   Her updated medication list for this problem includes:    Cymbalta 60 Mg Cpep (Duloxetine hcl) .Marland Kitchen... 1 tab by mouth daily    Alprazolam 0.25 Mg Tabs (Alprazolam) .Marland Kitchen... 1 tab by mouth two times a day as needed anxiety  Complete Medication List: 1)  Lipitor 20 Mg Tabs (Atorvastatin calcium) .... Take 1 tablet by mouth once a day 2)  Seroquel 50 Mg Tabs (Quetiapine fumarate) .Marland Kitchen.. 1 tab by mouth qpm 3)  Cymbalta 60 Mg Cpep (Duloxetine hcl) .Marland Kitchen.. 1 tab by mouth daily 4)  Alprazolam 0.25 Mg Tabs (Alprazolam) .Marland Kitchen.. 1 tab by mouth two times a day as needed anxiety 5)  Benicar Hct 40-12.5 Mg Tabs (Olmesartan medoxomil-hctz) .Marland Kitchen.. 1 tab by mouth daily 6)  Adult Aspirin Low Strength 81 Mg Tbdp (Aspirin) .... Take 1 tablet by mouth once a day 7)  Norvasc 5 Mg Tabs (Amlodipine besylate) .Marland Kitchen.. 1 tab by mouth daily 8)  Metanx 2.8-25-2 Mg Tabs (L-methylfolate-b6-b12) .... 2 tabs by mouth daily 9)  Prilosec Otc 20 Mg Tbec (Omeprazole magnesium) .Marland Kitchen.. 1 tab by mouth daily 10)  Vitamin D   Patient Instructions: 1)  A1C 6.2= great.   2)  Continue current meds. 3)  Return for a flu shot in 2 mos (nursse visit) and f/u diabetes in 4 mos.

## 2011-01-18 NOTE — Assessment & Plan Note (Signed)
Summary: f/u DM/ HTN/ mood   Vital Signs:  Patient profile:   75 year old female Height:      65.25 inches Weight:      182 pounds BMI:     30.16 O2 Sat:      95 % on Room air Pulse rate:   94 / minute BP sitting:   132 / 75  (left arm) Cuff size:   large  Vitals Entered By: Payton Spark CMA (April 12, 2010 10:00 AM)  O2 Flow:  Room air CC: F/u DM.  Unable to take Wellbutrin due to nightmares. Discuss 90 day refills. , Hypertension Management   Primary Care Provider:  Seymour Bars DO  CC:  F/u DM.  Unable to take Wellbutrin due to nightmares. Discuss 90 day refills.  and Hypertension Management.  History of Present Illness: 75 yo WF presents for f/u mood/ bloodwork and sugar.  She had to stop Wellubtrin due to hallucinations.  Still under stress at home.  C/o fatigue and wanting to sleep all the time.  She has hx of OSA but has refused to wear her CPAP.    She is checking an AM fastings from time to time.  She has been running 120s.  She has only fair diet control and she is not exercising.  She on Metformin once a day.  She is planning to have podiatric surgery soon.      Hypertension History:      She denies headache, chest pain, palpitations, dyspnea with exertion, peripheral edema, visual symptoms, neurologic problems, and side effects from treatment.  She notes no problems with any antihypertensive medication side effects.        Positive major cardiovascular risk factors include female age 57 years old or older, diabetes, hyperlipidemia, and hypertension.  Negative major cardiovascular risk factors include negative family history for ischemic heart disease and non-tobacco-user status.        Further assessment for target organ damage reveals no history of ASHD, cardiac end-organ damage (CHF/LVH), stroke/TIA, peripheral vascular disease, renal insufficiency, or hypertensive retinopathy.     Habits & Providers  Alcohol-Tobacco-Diet     Tobacco Status: never  Current  Medications (verified): 1)  Lipitor 20 Mg  Tabs (Atorvastatin Calcium) .... Take 1 Tablet By Mouth Once A Day 2)  Seroquel 100 Mg Tabs (Quetiapine Fumarate) .... 0.5 Tab By Mouth Qhs 3)  Cymbalta 60 Mg  Cpep (Duloxetine Hcl) .Marland Kitchen.. 1 Tab By Mouth Daily 4)  Alprazolam 0.25 Mg  Tabs (Alprazolam) .Marland Kitchen.. 1 Tab By Mouth Two Times A Day As Needed Anxiety 5)  Benicar Hct 40-12.5 Mg Tabs (Olmesartan Medoxomil-Hctz) .Marland Kitchen.. 1 Tab By Mouth Daily 6)  Adult Aspirin Low Strength 81 Mg  Tbdp (Aspirin) .... Take 1 Tablet By Mouth Once A Day 7)  Norvasc 5 Mg  Tabs (Amlodipine Besylate) .Marland Kitchen.. 1 Tab By Mouth Daily 8)  Metanx 2.8-25-2 Mg Tabs (L-Methylfolate-B6-B12) .... 2 Tabs By Mouth Daily 9)  Metformin Hcl 500 Mg Tabs (Metformin Hcl) .... Take 1 Tablet By Mouth Once A Day in The Am. 10)  Prilosec Otc 20 Mg Tbec (Omeprazole Magnesium) .Marland Kitchen.. 1 Tab By Mouth Daily  Allergies (verified): 1)  ! Wellbutrin 2)  Ace Inhibitors 3)  Actos (Pioglitazone Hcl)  Social History: Smoking Status:  never  Review of Systems      See HPI  Physical Exam  General:  alert, well-developed, well-nourished, well-hydrated, and overweight-appearing.   Head:  normocephalic and atraumatic.   Eyes:  pupils  equal, pupils round, and pupils reactive to light.   Nose:  no nasal discharge.   Mouth:  pharynx pink and moist.   Neck:  no masses.   Lungs:  Normal respiratory effort, chest expands symmetrically. Lungs are clear to auscultation, no crackles or wheezes. Heart:  2/6 systolic murmurnormal rate and regular rhythm.   Pulses:  2+ L and R pedal pulses Extremities:  trace LE edema Skin:  color normal.   Cervical Nodes:  No lymphadenopathy noted Psych:  good eye contact, not anxious appearing, and flat affect.     Impression & Recommendations:  Problem # 1:  DIABETES MELLITUS, TYPE II, CONTROLLED (ICD-250.00) A1C is 6.4, up from 6.1 with no change to diet or exercise.  She will call for her eye exam.  Will up her metformin to 2 x a  day from once a day.   Her updated medication list for this problem includes:    Benicar Hct 40-12.5 Mg Tabs (Olmesartan medoxomil-hctz) .Marland Kitchen... 1 tab by mouth daily    Adult Aspirin Low Strength 81 Mg Tbdp (Aspirin) .Marland Kitchen... Take 1 tablet by mouth once a day    Metformin Hcl 500 Mg Tabs (Metformin hcl) .Marland Kitchen... 1 tab by mouth bid  Orders: Fingerstick (73220) Hemoglobin A1C (25427) T-Comprehensive Metabolic Panel [phone]) Prescription Created Electronically [phone])  Labs Reviewed: Creat: 0.91 (03/06/2009)     Last Eye Exam: no retinopathy Dr Weston Anna (08/19/2009) Reviewed HgBA1c results: 6.4 (04/12/2010)  6.1 (01/06/2010)  Problem # 2:  HYPERLIPIDEMIA (ICD-272.4) Update fasting labs.   Her updated medication list for this problem includes:    Lipitor 20 Mg Tabs (Atorvastatin calcium) .Marland Kitchen... Take 1 tablet by mouth once a day  Orders: T-Lipid Profile [phone])  Labs Reviewed: SGOT: 24 (03/06/2009)   SGPT: 12 (03/06/2009)  10 Yr Risk Heart Disease: 13 % Prior 10 Yr Risk Heart Disease: 8 % (09/04/2008)   HDL:55 (03/06/2009), 49 (03/05/2008)  LDL:94 (03/06/2009), 99 (03/05/2008)  Chol:166 (03/06/2009), 170 (03/05/2008)  Trig:83 (03/06/2009), 110 (03/05/2008)  Problem # 3:  DEPRESSION, RECURRENT (ICD-311) Mood OK with Cymbalta + Alprazolam.  Continue.  Stopped Wellbutrin due to hallucinations. Stress level at home unchanged.  I would like to see her get out more with respit care from hospice for her sister.  The following medications were removed from the medication list:    Wellbutrin Xl 300 Mg Xr24h-tab (Bupropion hcl) .Marland Kitchen... 1 tab by mouth qam Her updated medication list for this problem includes:    Cymbalta 60 Mg Cpep (Duloxetine hcl) .Marland Kitchen... 1 tab by mouth daily    Alprazolam 0.25 Mg Tabs (Alprazolam) .Marland Kitchen... 1 tab by mouth two times a day as needed anxiety  Problem # 4:  HYPERTENSION, BENIGN ESSENTIAL (ICD-401.1) SBP above goal of <130.  Will recheck at f/u visit. Her  updated medication list for this problem includes:    Benicar Hct 40-12.5 Mg Tabs (Olmesartan medoxomil-hctz) .Marland Kitchen... 1 tab by mouth daily    Norvasc 5 Mg Tabs (Amlodipine besylate) .Marland Kitchen... 1 tab by mouth daily  BP today: 132/75 Prior BP: 128/77 (01/06/2010)  10 Yr Risk Heart Disease: 13 % Prior 10 Yr Risk Heart Disease: 8 % (09/04/2008)  Labs Reviewed: K+: 4.8 (03/06/2009) Creat: : 0.91 (03/06/2009)   Chol: 166 (03/06/2009)   HDL: 55 (03/06/2009)   LDL: 94 (03/06/2009)   TG: 83 (03/06/2009)  Complete Medication List: 1)  Lipitor 20 Mg Tabs (Atorvastatin calcium) .... Take 1 tablet by mouth once a day 2)  Seroquel 50 Mg Tabs (  Quetiapine fumarate) .Marland Kitchen.. 1 tab by mouth qpm 3)  Cymbalta 60 Mg Cpep (Duloxetine hcl) .Marland Kitchen.. 1 tab by mouth daily 4)  Alprazolam 0.25 Mg Tabs (Alprazolam) .Marland Kitchen.. 1 tab by mouth two times a day as needed anxiety 5)  Benicar Hct 40-12.5 Mg Tabs (Olmesartan medoxomil-hctz) .Marland Kitchen.. 1 tab by mouth daily 6)  Adult Aspirin Low Strength 81 Mg Tbdp (Aspirin) .... Take 1 tablet by mouth once a day 7)  Norvasc 5 Mg Tabs (Amlodipine besylate) .Marland Kitchen.. 1 tab by mouth daily 8)  Metanx 2.8-25-2 Mg Tabs (L-methylfolate-b6-b12) .... 2 tabs by mouth daily 9)  Metformin Hcl 500 Mg Tabs (Metformin hcl) .Marland Kitchen.. 1 tab by mouth bid 10)  Prilosec Otc 20 Mg Tbec (Omeprazole magnesium) .Marland Kitchen.. 1 tab by mouth daily  Other Orders: T-TSH ([id number]) T-CBC w/Diff [phone])  Hypertension Assessment/Plan:      The patient's hypertensive risk group is category C: Target organ damage and/or diabetes.  Her calculated 10 year risk of coronary heart disease is 13 %.  Today's blood pressure is 132/75.  Her blood pressure goal is < 130/80.  Patient Instructions: 1)  Fasting labs today. 2)  Will call you w/ results tomorrow. 3)  Increase Metformin to 500 mg TWICE a day (with Breakfast and dinner). 4)  Work on healthy (diabetic ) diet and increased exercise. 5)  Meds RFd. 6)  Return for f/u in 4  mos. Prescriptions: BENICAR HCT 40-12.5 MG TABS (OLMESARTAN MEDOXOMIL-HCTZ) 1 tab by mouth daily  #90 x 1   Entered and Authorized by:   Seymour Bars DO   Signed by:   Seymour Bars DO on 04/12/2010   Method used:   [facility]        [id number]       [address] [place], [place]  [address]       Ph: [phone] or [phone]       Fax: [phone]   RxID:   [id number] METFORMIN HCL 500 MG TABS (METFORMIN HCL) 1 tab by mouth bid  #180 x 1   Entered and Authorized by:   Seymour Bars DO   Signed by:   Seymour Bars DO on 04/12/2010   Method used:   [facility]        [id number]       [address] [place], [place]  [address]       Ph: [phone] or [phone]       Fax: [phone]   RxID:   [id number] SEROQUEL 50 MG TABS (QUETIAPINE FUMARATE) 1 tab by mouth qPM  #90 x 1   Entered and Authorized by:   Seymour Bars DO   Signed by:   Seymour Bars DO on 04/12/2010   Method used:   [facility]        [id number]       [address] [place], [place]  [address]       Ph: [phone] or [phone]       Fax: [phone]   RxID:   [id number]   Laboratory Results   Blood Tests     HGBA1C: 6.4%   (Normal Range: Non-Diabetic - 3-6%   Control Diabetic -  6-8%)     

## 2011-01-18 NOTE — Miscellaneous (Signed)
Summary: no retinopathy  Clinical Lists Changes  Observations: Added new observation of DIAB EYE EX: no retinopathy (Dr Luberta Robertson) (08/24/2010 12:05)

## 2011-01-20 NOTE — Assessment & Plan Note (Signed)
Summary: f/u DM   Vital Signs:  Patient profile:   75 year old female Height:      65.25 inches Weight:      181 pounds BMI:     30.00 O2 Sat:      96 % on Room air Pulse rate:   92 / minute BP sitting:   133 / 77  (left arm) Cuff size:   large  Vitals Entered By: Payton Spark CMA (December 17, 2010 10:40 AM)  O2 Flow:  Room air CC: F/U DM. States she has not been taking Metformin bc Rx was misplaced and she forgot about it.    Primary Care Provider:  Seymour Bars DO  CC:  F/U DM. States she has not been taking Metformin bc Rx was misplaced and she forgot about it. Marland Kitchen  History of Present Illness:  Type 2 diabetes mellitus follow-up      This is a 75 year old woman who presents with Type 2 diabetes mellitus follow-up.  She lost her RX for metformin.  She has not been checking her sugars at home.  The patient complains of numbness of extremities, but denies polyuria, blurred vision, and hypoglycemia requiring help.  The patient denies the following symptoms: chest pain, poor wound healing, vision loss, and foot ulcer.  Since the last visit the patient reports poor dietary compliance, noncompliance with medications, and not exercising regularly.  Since the last visit, the patient reports having had eye care by an ophthalmologist.  Complications from diabetes include peripheral neuropathy.     Allergies (verified): 1)  ! Wellbutrin 2)  Ace Inhibitors 3)  Actos (Pioglitazone Hcl)  Past History:  Past Medical History: Reviewed history from 01/06/2010 and no changes required. HTN DM 05-2009 high cholestrol L knee OA- DR Yisroel Ramming EKG 2/05 DXA 10/01 G2P2 depression GERD (Dr Juanda Chance), hiatal hernia Mitral valve regurg Diverticulosisis asthma cervical disc dz pernicious anemia OSA-- non compliant with CPAP diabetic neuropathy optho: Dr Jen Mow ([facility]) cspine radiculopathy IBS  Past Surgical History: Reviewed history from 03/18/2008 and no changes required. Hysterectomy  for fibroids breast reduction G2P2 R knee replacement 11/05- Dr Yisroel Ramming L TKR Tonsillectomy Appendectomy Sinus surgery  Social History: Reviewed history from 07/05/2007 and no changes required. Retired from [employer].  Her younger sister with MS lives with her.   2 sons, one in [place], 1 in Paediatric nurse.  Never smoked.  Denies ETOH. no exercise due to knee pain.  HS diploma.  Divorced, no relationship.  Review of Systems      See HPI  Physical Exam  General:  alert, well-developed, well-nourished, well-hydrated, and overweight-appearing.   Head:  normocephalic and atraumatic.   Mouth:  pharynx pink and moist.   Neck:  no masses.   Lungs:  Normal respiratory effort, chest expands symmetrically. Lungs are clear to auscultation, no crackles or wheezes. Heart:  2/6 systolic murmurnormal rate and regular rhythm.   Extremities:  no leg edema Skin:  color normal.   Cervical Nodes:  No lymphadenopathy noted Psych:  good eye contact, not anxious appearing, and not depressed appearing.     Impression & Recommendations:  Problem # 1:  DIABETES MELLITUS, TYPE II, CONTROLLED (ICD-250.00) A1C looks good at 6.3.  Will keep her off Meformin.  She is to work on diabetic diet, regular exercise and wt loss. BP OK.  Umicro normal.  Had her flu shot.  RTC in 4 mos for f/u. The following medications were removed from the medication list:  Metformin Hcl 500 Mg Tabs (Metformin hcl) .Marland Kitchen... Take one tablet by mouth twice a day Her updated medication list for this problem includes:    Benicar Hct 40-12.5 Mg Tabs (Olmesartan medoxomil-hctz) .Marland Kitchen... 1 tab by mouth daily    Adult Aspirin Low Strength 81 Mg Tbdp (Aspirin) .Marland Kitchen... Take 1 tablet by mouth once a day  Orders: Fingerstick (40981) Urine Microalbumin (19147) Hgb A1C (82956OZ)  Labs Reviewed: Creat: 0.96 (04/12/2010)   Microalbumin: 30 (12/17/2010)  Last Eye Exam: no retinopathy (Dr Luberta Robertson) (08/24/2010) Reviewed HgBA1c results: 6.3  (12/17/2010)  6.4 (04/12/2010)  Complete Medication List: 1)  Lipitor 20 Mg Tabs (Atorvastatin calcium) .... Take 1 tablet by mouth once a day 2)  Seroquel 50 Mg Tabs (Quetiapine fumarate) .Marland Kitchen.. 1 tab by mouth qpm 3)  Cymbalta 60 Mg Cpep (Duloxetine hcl) .Marland Kitchen.. 1 tab by mouth daily 4)  Alprazolam 0.25 Mg Tabs (Alprazolam) .Marland Kitchen.. 1 tab by mouth two times a day as needed anxiety 5)  Benicar Hct 40-12.5 Mg Tabs (Olmesartan medoxomil-hctz) .Marland Kitchen.. 1 tab by mouth daily 6)  Adult Aspirin Low Strength 81 Mg Tbdp (Aspirin) .... Take 1 tablet by mouth once a day 7)  Norvasc 5 Mg Tabs (Amlodipine besylate) .Marland Kitchen.. 1 tab by mouth daily 8)  Metanx 2.8-25-2 Mg Tabs (L-methylfolate-b6-b12) .... 2 tabs by mouth daily 9)  Prilosec Otc 20 Mg Tbec (Omeprazole magnesium) .Marland Kitchen.. 1 tab by mouth daily  Patient Instructions: 1)  Will keep you OFF Metformin. 2)  A1C looks good at 6.3. 3)  Continue current meds. 4)  Return for f/u diabetes/ BP/ fasting labs in 4 mos.   Orders Added: 1)  Fingerstick [36416] 2)  Urine Microalbumin [82044] 3)  Hgb A1C [83036QW] 4)  Est. Patient Level III [30865]    Laboratory Results   Urine Tests    Microalbumin (urine): 30 mg/L Creatinine: 200mg /dL  A:C Ratio <78  Blood Tests     HGBA1C: 6.3%   (Normal Range: Non-Diabetic - 3-6%   Control Diabetic - 6-8%)

## 2011-03-24 ENCOUNTER — Other Ambulatory Visit: Payer: Self-pay | Admitting: Family Medicine

## 2011-04-01 ENCOUNTER — Encounter: Payer: Self-pay | Admitting: Family Medicine

## 2011-04-08 ENCOUNTER — Other Ambulatory Visit: Payer: Self-pay | Admitting: Family Medicine

## 2011-04-14 ENCOUNTER — Encounter: Payer: Self-pay | Admitting: Family Medicine

## 2011-04-14 ENCOUNTER — Ambulatory Visit (INDEPENDENT_AMBULATORY_CARE_PROVIDER_SITE_OTHER): Payer: Medicare Other | Admitting: Family Medicine

## 2011-04-14 VITALS — BP 121/75 | HR 88 | Ht 65.25 in | Wt 183.0 lb

## 2011-04-14 DIAGNOSIS — R5383 Other fatigue: Secondary | ICD-10-CM

## 2011-04-14 DIAGNOSIS — E119 Type 2 diabetes mellitus without complications: Secondary | ICD-10-CM

## 2011-04-14 DIAGNOSIS — E785 Hyperlipidemia, unspecified: Secondary | ICD-10-CM

## 2011-04-14 DIAGNOSIS — F329 Major depressive disorder, single episode, unspecified: Secondary | ICD-10-CM

## 2011-04-14 DIAGNOSIS — E041 Nontoxic single thyroid nodule: Secondary | ICD-10-CM

## 2011-04-14 DIAGNOSIS — F3289 Other specified depressive episodes: Secondary | ICD-10-CM

## 2011-04-14 DIAGNOSIS — I10 Essential (primary) hypertension: Secondary | ICD-10-CM

## 2011-04-14 LAB — CBC WITH DIFFERENTIAL/PLATELET
Eosinophils Absolute: 0.2 10*3/uL (ref 0.0–0.7)
Eosinophils Relative: 3 % (ref 0–5)
HCT: 43.2 % (ref 36.0–46.0)
Lymphs Abs: 2.1 10*3/uL (ref 0.7–4.0)
MCH: 28.3 pg (ref 26.0–34.0)
MCV: 87.4 fL (ref 78.0–100.0)
Monocytes Absolute: 0.6 10*3/uL (ref 0.1–1.0)
Platelets: 236 10*3/uL (ref 150–400)
RBC: 4.94 MIL/uL (ref 3.87–5.11)

## 2011-04-14 LAB — LIPID PANEL
Cholesterol: 164 mg/dL (ref 0–200)
Total CHOL/HDL Ratio: 3 Ratio
VLDL: 25 mg/dL (ref 0–40)

## 2011-04-14 MED ORDER — ALPRAZOLAM 0.25 MG PO TABS: 0.2500 mg | ORAL_TABLET | Freq: Two times a day (BID) | ORAL | Status: DC | PRN

## 2011-04-14 NOTE — Assessment & Plan Note (Signed)
BP at goal on current meds.  Update labs.

## 2011-04-14 NOTE — Assessment & Plan Note (Signed)
A1c good at 6.5 w/o meds.  Will continue diabetic diet, work on regular physical activity and monitor AM fasting sugars 2 days/ wk.

## 2011-04-14 NOTE — Assessment & Plan Note (Signed)
Though she is dealing with some major stressors - recent loss of one sister and the impending loss of her other sister for whom she has been a caretaker for for the past 25 yrs, she seems to be dealing with this much better.  She did not cry this visit and admits to leaning on friends and family for support.  I will continue her current meds.  She has been able to get respit care and counseling thru hospice.

## 2011-04-14 NOTE — Patient Instructions (Signed)
Update fasting labs today. Will call you w/ results tomorrow.  Form completed for diabetic shoes/ podiatry note signed off on.  BP looks great.  Add a B complex vitamin daily.  Return for f/u in 4 mos.

## 2011-04-14 NOTE — Progress Notes (Signed)
  Subjective:    Patient ID: Christine Washington, female    DOB: Nov 10, 1936, 75 y.o.   MRN: [id number]  HPI 75 yo WF presents for f/u T2DM complicated by neuropathy.  Her A1C is 6.5 today and she remains diet controlled.  She has been under a lot of stress this year.  Her younger sister died in 2023/01/25 and her other sister has  Progressing MS, under home hospice.  She is tired all the time.  Taking her psych meds and has a good support system.  Seeing podiatry (Dr Estill Cotta) for calluses on her feet and qualifies for diabetic shoes.  Due for fasting labs.  Denies chest pain or DOE.  Doing well on her meds.    BP 121/75  Pulse 88  Ht 5' 5.25" (1.657 m)  Wt 183 lb (83.008 kg)  BMI 30.22 kg/m2  SpO2 96%   Review of Systems  Constitutional: Positive for fatigue. Negative for fever and unexpected weight change.  Respiratory: Negative for cough and shortness of breath.   Cardiovascular: Negative for chest pain, palpitations and leg swelling.  Genitourinary: Negative for frequency and difficulty urinating.  Neurological: Negative for headaches.  Psychiatric/Behavioral: Negative for sleep disturbance and dysphoric mood. The patient is not nervous/anxious.        Objective:   Physical Exam  Constitutional: She is oriented to person, place, and time. She appears well-developed and well-nourished. No distress.  HENT:  Head: Normocephalic and atraumatic.  Eyes: Conjunctivae are normal. No scleral icterus.  Neck: Neck supple.  Cardiovascular: Normal rate and regular rhythm.   Murmur heard. Pulmonary/Chest: Effort normal and breath sounds normal. No respiratory distress. She has no wheezes.  Musculoskeletal: She exhibits no edema.  Lymphadenopathy:    She has no cervical adenopathy.  Neurological: She is alert and oriented to person, place, and time.  Skin: Skin is warm and dry.  Psychiatric: She has a normal mood and affect.       Mood seems much improved          Assessment & Plan:

## 2011-04-15 ENCOUNTER — Telehealth: Payer: Self-pay | Admitting: Family Medicine

## 2011-04-15 LAB — COMPLETE METABOLIC PANEL WITH GFR
ALT: 14 U/L (ref 0–35)
AST: 26 U/L (ref 0–37)
Albumin: 5 g/dL (ref 3.5–5.2)
Alkaline Phosphatase: 89 U/L (ref 39–117)
Potassium: 3.9 mEq/L (ref 3.5–5.3)
Sodium: 142 mEq/L (ref 135–145)
Total Protein: 7.4 g/dL (ref 6.0–8.3)

## 2011-04-15 NOTE — Telephone Encounter (Signed)
Pls let pt know that her blood counts, thyroid function and cholesterol look great.  She has some mild, unchanged chronic kidney disease.  Continue current meds.  Avoid anti inflammatories like ibuprofen, aleve.  Ok to take tylenol for aches and pains.  Recheck kidney function every 6 mos.

## 2011-04-15 NOTE — Telephone Encounter (Signed)
Pt aware of the above  

## 2011-04-16 ENCOUNTER — Other Ambulatory Visit: Payer: Self-pay | Admitting: Family Medicine

## 2011-04-17 ENCOUNTER — Other Ambulatory Visit: Payer: Self-pay | Admitting: Family Medicine

## 2011-05-03 NOTE — H&P (Signed)
Christine Washington, Christine Washington               ACCOUNT NO.:  [id number]   MEDICAL RECORD NO.:  [id number]         PATIENT TYPE:  LINP   LOCATION:                               FACILITY:  [facility]   PHYSICIAN:  Ollen Gross, M.D.    DATE OF BIRTH:  1936-02-16   DATE OF ADMISSION:  10/31/2007  DATE OF DISCHARGE:                              HISTORY & PHYSICAL   CHIEF COMPLAINT:  Left-knee pain.   HISTORY OF PRESENT ILLNESS:  Patient is a 75 year old female, who has  been seen by Dr. Despina Hick for ongoing left-knee pain.  She has had a  previous right total knee and has done quite well with that.  She has  known end-stage arthritis of the left knee and is ready to have that  done.  She has been seen in consultation by Dr. Despina Hick.  X-rays show  significant end-stage median patellofemoral arthritis.  Right-knee x-  rays show previous replacement in good position.  She was felt to be a  candidate, subsequently admitted to the hospital.   ALLERGIES:  No known allergies.   CURRENT MEDICATIONS:  Omeprazole, Benicar/HCT, Buspirone-SR, Lipitor, B  complex, clonazepam.   PAST MEDICAL HISTORY:  Hypertension, anxiety, depression, asthma,  history of sleep apnea, but she is off her CPAP for three or four years  after considerable weight-loss; hiatal hernia, noninsulin-dependent  diabetes mellitus, hyperlipidemia.   PAST SURGICAL HISTORY:  Hysterectomy in 1980.  Sinus surgery in 1990.  Breast reduction in 1990.  Right total knee replacement approximately  three years ago.   SOCIAL HISTORY:  Divorced, retired, nonsmoker, no alcohol.  Two sons.  She may need inpatient rehab or skilled nursing facility, depending on  her progress.   FAMILY HISTORY:  Both parents with heart disease, hypertension, stroke  and arthritis.  Mother with diabetes.  She has a sister with kidney  cancer and a son with a myocardial infarction.   REVIEW OF SYSTEMS:  GENERAL:  Denies fevers, chills or night sweats.  NEUROLOGIC:  Denies  seizures, syncope, paralysis.  RESPIRATORY:  No  shortness of breath, productive cough or hemoptysis.  CARDIOVASCULAR:  No chest pain, angina or orthopnea.  GI:  No nausea, vomiting, diarrhea  or constipation.  GU:  No dysuria, hematuria or discharge.  MUSCULOSKELETAL:  Left knee.   PHYSICAL EXAM:  VITAL SIGNS:  Pulse 80, respirations 14, blood pressure  128/70.  GENERAL:  A 75 year old white female, well-nourished, well-developed, in  no acute distress.  She is anxious, somewhat teary-eyed at her visit.  HEENT:  Normocephalic, atraumatic.  Pupils round and reactive.  Oropharynx clear.  EOM intact.  NECK:  Supple, no bruits.  CHEST:  Clear anterior and posterior chest.  No rhonchi, rales or  wheezing.  HEART:  Regular rate and rhythm, no murmur.  ABDOMEN:  Soft, nontender, bowel sounds present.  RECTAL/BREASTS/GENITALIA:  Not done, not pertinent to present illness.  LEFT KNEE:  Range of motion 5-150, marked crepitus, tender, more medial  than lateral.   IMPRESSION:  1. Osteoarthritis, left knee.  2. Hypertension.  3. Anxiety.  4. Depression.  5. Asthma.  6. History of sleep apnea (no longer uses CPAP).  7. Hiatal hernia.  8. Noninsulin-dependent diabetes mellitus.  9. Hyperlipidemia.   PLAN:  Patient will be admitted to [facility] to undergo a  left total knee replacement arthroplasty.  Surgery will be performed by  Dr. Trudee Grip.      Christine Washington, P.A.C.      Ollen Gross, M.D.  Electronically Signed    ALP/MEDQ  D:  10/24/2007  T:  10/24/2007  Job:  [id number]   cc:   Seymour Bars, D.O.  [facility].  [address], [address]  [place], [place] [address]

## 2011-05-03 NOTE — Assessment & Plan Note (Signed)
[facility]                            CARDIOLOGY OFFICE NOTE   [name], [name]                      MRN:          [id number]  DATE:12/07/2007                            DOB:          October 09, 1936    [name] is a 75 year old female with past medical history of diabetes  mellitus, hypertension, hyperlipidemia who were asked to evaluate for  syncope.  The patient does say that she wore a monitor in the past years  ago but she does not recall the reason.  There is also question of  mitral regurgitation and of cardiac history.  However, she recently has  been having near syncopal episodes.  This began in November prior to  knee replacement surgery.  She states that she was walking across the  room and suddenly felt lightheaded and the room went black for 1-2  seconds and then resolved.  There was no associated chest pain,  shortness of breath, nausea, vomiting, incontinence, seizure activity,  loss of strength or sensation in her extremities and she did not fall.  She had multiple episodes of this during that one week.  Since then she  has had 2 subsequent episodes but these apparently are improving.  She  was seen by Dr. Cathey Endow on November 27, 2007.  At that time it was noted on  one episode was associated with low blood pressure but others have not  been.  Dr. Cathey Endow has decreased her Benicar HCT.  She does think this  feels somewhat better.  Note, she does have some dyspnea on exertion but  attributes this to decreased conditioning following her knee surgery.  There is no exertional chest pain.  There is no orthopnea, PND or pedal  edema.  Because of her syncope we were asked to further evaluate.   MEDICATIONS:  1. Lipitor 20 mg p.o. daily.  2. Vitamin B complex.  3. Multivitamin.  4. Benicar 20 mg p.o. daily.  5. Prilosec.  6. Seroquel 25 mg p.o. daily.  7. Cymbalta 30 mg p.o. b.i.d.   SHE HAS AN ALLERGY TO ACE INHIBITORS (COUGH).   SOCIAL  HISTORY:  She does not smoke nor does she consume alcohol.   FAMILY HISTORY:  Significant for hypertension and diabetes.  There is  also MS in her sister.   PAST MEDICAL HISTORY:  Significant for hypertension, diabetes mellitus  as well as hyperlipidemia.  She is presently being evaluated for a  multinodular thyromegaly.  She has had prior gastroesophageal reflux  disease.  She has also had a prior tonsillectomy as well as breast  reduction surgery.  She has had a prior appendectomy and hysterectomy.  She has had bilateral knee replacement.  She has also had previous sinus  surgery.   REVIEW OF SYSTEMS:  She denies any headaches or fevers or chills.  There  is no productive cough or hemoptysis.  There is no dysphagia,  odynophagia, melena or hematochezia.  There is no dysuria or hematuria.  There is no rash or seizure activity.  There is no orthopnea, PND or  pedal edema.  There is  no claudication noted.  She does have some  residual pain in her left knee from her recent knee replacement.  The  remaining systems are negative.   PHYSICAL EXAMINATION:  Shows in the lying position her pulse is 81 with  a blood pressure of 143/80.  In the standing position after 5 minutes  she has a heart rate of 103 with a blood pressure of 145/89.  She is not  having symptoms with any of these.  Noting with sitting she was 84 heart  rate with a blood pressure of 122/83.  She is well-developed and well-  nourished, in no acute distress.  SKIN:  Warm and dry.  She does not appear to be depressed and there is no peripheral clubbing.  BACK:  Normal.  HEENT:  Normal with normal eyelids.  NECK:  Supple with no bruits bilaterally.  There is no jugular venous  distention.  CHEST:  Clear to auscultation with normal expansion.  CARDIOVASCULAR:  Reveals a regular rate and rhythm with normal S1 and  S2, I can not appreciate murmurs, rubs or gallops.  ABDOMINAL:  Shows no tenderness to palpation, there is no  bruit noted.  There is no hepatosplenomegaly, no masses are palpated.  There is no  tenderness or distention.  She has 2+ femoral pulses bilaterally.  There are bilateral femoral  bruits.  Extremities show no edema and I could palpate no cords.  She is  status post left knee replacement recently.  She has 2+ posterior tibial  pulses bilaterally.  NEUROLOGIC:  Exam was grossly intact.   I do have an electrocardiogram from Dr. Ovidio Kin office dated October 25, 2007.  She had a sinus rhythm at a rate of 78.  The axis was normal.  There were no significant ST changes.  Her QT interval was normal.   DIAGNOSES:  1. Syncope -- Etiology of this is unclear to me.  We will schedule her      to have a CardioNet monitor to exclude significant arrhythmia.  I      will also schedule her to have an echocardiogram to quantify her      left ventricular function and also to assess her mitral valve      (there is a question of mitral regurgitation in the past although I      do not appreciate this on exam).  We will also schedule her to have      an adenosine Myoview.  I will see her back in [place] in      approximately 4-6 weeks to review the above symptoms.  2. Hypertension -- She will continue on her present dose of Benicar      and her blood pressure appears to be adequately controlled at this      point.  3. Hyperlipidemia -- She will continue on her statin, this being      monitored by Dr. Cathey Endow.  4. Diabetes mellitus -- Per Dr. Cathey Endow.     Madolyn Frieze Jens Som, MD, [facility]  Electronically Signed    BSC/MedQ  DD: 12/07/2007  DT: 12/08/2007  Job #: [id number]   cc:   Seymour Bars, D.O.

## 2011-05-03 NOTE — Discharge Summary (Signed)
Christine Washington, Christine Washington               ACCOUNT NO.:  [id number]   MEDICAL RECORD NO.:  [id number]          PATIENT TYPE:  INP   LOCATION:  1616                         FACILITY[facility]  [facility]   PHYSICIAN:  Ollen Gross, M.D.    DATE OF BIRTH:  [birth year]   DATE OF ADMISSION:  10/31/2007  DATE OF DISCHARGE:  11/05/2007                               DISCHARGE SUMMARY   ADMITTING DIAGNOSES:  1. Osteoarthritis left knee.  2. Hypertension.  3. Anxiety.  4. Depression.  5. History of sleep apnea (no longer has used CPAP).  6. Hiatal hernia.  7. Non-insulin-dependent diabetes mellitus.  8. Hyperlipidemia.   DISCHARGE DIAGNOSES:  1. Osteoarthritis left knee, status post left total knee replacement      arthroplasty.  2. Mild post-op blood loss anemia, did not require transfusion.  3. Hypertension.  4. Anxiety.  5. Depression.  6. History of sleep apnea (no longer has used CPAP).  7. Hiatal hernia.  8. Non-insulin-dependent diabetes mellitus.  9. Hyperlipidemia.   PROCEDURE:  October 31, 2007, left total knee.   SURGEON:  Dr. Lequita Halt.   ANESTHESIA:  General.   CONSULTS:  None.   BRIEF HISTORY:  Christine Washington is a 71-year female with end-stage arthritis  left knee and progressive worsening pain dysfunction, previous right  total knee, now presents for left total knee.   LABORATORY DATA:  Pre-op CBC with hemoglobin 13.2, hematocrit 38.2, ,  white cell count 7.5, post-op hemoglobin 10.6, drifted down to 10.3,  last H&H was 9.8 and 28.1.  PT/PTT pre-op 13.5 and 32 respectively, INR  1.0.  Serial pro-times followed with last PT/INR 23.6 and 2.0.  Chem  panel on admission all within normal limits.  Serial BMETs were  followed.  Electrolytes remained within normal limits.  Glucose went up  to 102, back down to 126.  Pre-op UA negative.  Blood group type A+.  EKG October 25, 2007:  Normal sinus rhythm, normal EKG, normal tracing,  no significant change with last tracing, performed by Dr.  Viann Fish.  Chest x-ray October 25, 2007:  No active disease.   HOSPITAL COURSE:  The patient was admitted to [facility],  tolerated the procedure well, later transferred to the recovery room and  orthopedic floor, started on PCA and p.o. analgesics for pain control  following surgery.  No complaints on the morning of day one,  doing  pretty well.  Started getting up, placed on Coumadin for DVT  prophylaxis.  Did not tolerate the Vicodin well, so increased up to the  Percocet, started getting up with PT by day 2, had a pretty good day.  Dressing changed, incision looked good.  INR was already therapeutic at  0.2, so we discontinued the PCAs, the fluids and IVs, encouraged  mobility.  It was felt that she was going to need skilled nursing  facility, so we got discharge planning involved.  A bed was being  sought.  They were looking into a disposition.  As noted on post-op day #2 though, she was getting up walking about 80  feet.  By  day three, progressing well through the weekends. It is felt  she might be transferred out on the following Monday.  She was seen  again on Sunday doing well, no complaints.  Knee was healing well, and  that following Monday it was felt that she would be ready to go.  Although she progressed so well, she wanted to go home, so we stopped  the discharge planning and by November 05, 2007 she had progressed well  with her therapy, doing well enough.  She would then arrangements for  home health.   DISCHARGE PLAN:  1. Patient discharged home November 05, 2007.  2. Discharge diagnoses.  Please see above.  3. Discharge meds:  Coumadin, Percocet, Robaxin.   FOLLOWUP:  Follow up on December 2, call for an appointment.   DISPOSITION:  Home.   ACTIVITY:  Weightbearing as tolerated.  Home health PT, home health  nursing.   CONDITION ON DISCHARGE:  Improving.      Alexzandrew L. Perkins, P.A.C.      Ollen Gross, M.D.  Electronically  Signed    ALP/MEDQ  D:  12/06/2007  T:  12/06/2007  Job:  [id number]   cc:   Ollen Gross, M.D.  Fax: [phone]   Seymour Bars, D.O.  [facility].  [address], [address]  [place], [place] [address]

## 2011-05-03 NOTE — Assessment & Plan Note (Signed)
[facility]                         GASTROENTEROLOGY OFFICE NOTE   MARSHELL, RIEGER                      MRN:          [id number]  DATE:08/16/2007                            DOB:          [birth year]    [name] is a very nice 75 year old white female with history of  gastroesophageal reflux disease documented on previous upper endoscopies  and 24 hour interesophageal pH probe which was done in 1995.  She had  last colonoscopy in February 1999 with findings of spastic esophageal  sphincter, she was dilated with [facility] dilator.  Her CLO test was  negative and she was treated with Prilosec and Librax.  She now has  persistent subxyphoid and epigastric pain which has not resolved on  Nexium 40 mg daily.  We saw her subsequently in February 2003 for  colorectal screening because of Hemoccult positive stool and that exam  showed just diverticulosis of the left colon.  She has occasional solid  food dysphagia.  There has been no vomiting, no fever or hematemesis.  The patient does not drink any alcohol.  She has been under a great deal  of stress because one of her sisters has MS and another one was just  diagnosed with renal mass.  She drinks only 1 cup of coffee a day and  has taken Maalox intermittently to relieve the substernal pain.   MEDICATIONS:  1. Benicar HCT 40/25 mg p.o. daily.  2. Bupropion 300 mg p.o. daily.  3. Lipitor 40 mg 1/2 tablet p.o. daily.  4. Nexium 40 mg p.o. q.a.m.  5. Clonazepam 4.5 mg daily.  6. B complex.  7. Multiple vitamins.   PHYSICAL EXAMINATION:  Blood pressure 118/62, pulse 72 and weight 174  pounds.  She was in no distress, alert and oriented.  NECK:  Supple, no adenopathy.  LUNGS:  Clear to auscultation.  COR:  With normal S1, normal S2.  ABDOMEN:  Soft but very tender in subxyphoid area and the midline.  Tenderness radiated to the left upper quadrant along the left costal  margin.  There was no fullness,  pulsations or bruit.  No CVA tenderness.  EXTREMITIES:  No edema.   IMPRESSION:  A 75 year old white female with substernal, subxyphoid pain  consistent with hiatal hernia, esophageal stricture or possibly  esophagitis.  Her symptoms are suggestive of early esophageal stricture.  Nexium is only partly helpful.   PLAN:  1. Increase Nexium to 40 mg p.o. b.i.d. temporarily, samples given.  2. Upper endoscopy with possibly esophageal dilatation and appropriate      biopsies to rule out Barrett's esophagus.  3. Use Maalox or Gaviscon 1-2 tablets three times a day for immediate      coating of the esophagus.     Hedwig Morton. Juanda Chance, MD  Electronically Signed    DMB/MedQ  DD: 08/16/2007  DT: 08/17/2007  Job #: [id number]   cc:   Seymour Bars, D.O.

## 2011-05-03 NOTE — Op Note (Signed)
Christine Washington, Christine Washington               ACCOUNT NO.:  [id number]   MEDICAL RECORD NO.:  [id number]          PATIENT TYPE:  INP   LOCATION:  1616                         FACILITY:  [facility]   PHYSICIAN:  Ollen Gross, M.D.    DATE OF BIRTH:  August 29, 1936   DATE OF PROCEDURE:  10/31/2007  DATE OF DISCHARGE:                               OPERATIVE REPORT   PREOPERATIVE DIAGNOSIS:  Osteoarthritis, left knee.   POSTOPERATIVE DIAGNOSIS:  Osteoarthritis, left knee.   PROCEDURE:  Left total knee arthroplasty.   SURGEON:  Ollen Gross, M.D.   ASSISTANT:  Nurse.   ANESTHESIA:  General with postop Marcaine pain pump.   ESTIMATED BLOOD LOSS:  Minimal.   DRAINS:  None.   TOURNIQUET TIME:  34 minutes at 300 mmHg.   COMPLICATIONS:  None.   CONDITION:  Stable to recovery.   BRIEF CLINICAL NOTE:  [name] is a 75 year old female with end-stage  arthritis of the left knee with progressively worsening pain and  dysfunction.  She has had a previous right total knee arthroplasty which  was successful and presents now for a left total knee arthroplasty.   PROCEDURE IN DETAIL:  After the successful administration general  anesthetic, a tourniquet was placed high on the left thigh and the left  lower extremity prepped and draped in usual sterile fashion.  The  extremity was wrapped in Esmarch, knee flexed, and tourniquet inflated  to 300 mmHg.   A midline incision was made with a 10 blade through subcutaneous tissue  to the level of the extensor mechanism.  A fresh blade was used to make  a medial parapatellar arthrotomy.  Soft tissue of proximal medial tibia  was subperiosteally elevated to the joint line with the knife and into  the semimembranosus bursa with a Cobb elevator.  Soft tissue laterally  was elevated, with attention being paid to avoiding the patellar tendon  on the tibial tubercle.  The patella was subluxed laterally and the knee  flexed 90 degrees.  ACL and PCL removed.  The drill  was used create a  starting hole in the distal femur, and the canal was thoroughly  irrigated.  The 5-degree left valgus alignment guide was placed for  referencing off the posterior condyles.  Rotations was marked and the  block pinned to remove 11 mm off the distal femur.  I took 11 because of  a preop flexion contracture.  Distal femoral resection was made with an  oscillating saw.  A sizing block was placed, size 3 as the most  appropriate.  Rotation was marked at the epicondylar axis.  A size 3  cutting block was placed and the anterior, posterior, and chamfer cuts  were made.   The tibia was subluxed forward and the menisci removed.  The  extramedullary tibial alignment guides placed referencing proximally at  the medial aspect of tibial tubercle and distally along the second  metatarsal axis and tibial crest.  The block is pinned to remove 10 mm  off the nondeficient lateral side.  Tibial resection was made with an  oscillating saw.  A  size 3 was the most appropriate tibial component,  and the proximal tibia was prepared with the modular drill and keel  punch for a size 3.  Femoral preparation was completed with the  intercondylar cut.   The size 3 mobile bearing tibial trial and size 3 posterior stabilized  femoral trial and a 10-mm posterior stabilized rotating platform insert  trial were placed.  With a 10, she hyperextends a little bit, so we went  to 12.5 which allowed for full extension with excellent varus, valgus,  anterior and posterior balance throughout full range of motion.  The  patella was then everted and thickness measured to be 23 mm.  Freehand  resection was taken to 13 mm, 35 template was placed, lug holes were  drilled, and the trial patella was placed and it tracks normally.  Osteophytes were removed off the posterior femur with the trial in  place.  All trials were removed, and the cut bone surfaces were prepared  with pulsatile lavage.  Cement was mixed,  and once ready for  implantation, a size 3 mobile bearing tibial tray, size 3 posterior  stabilized femur, and 35 patella were cemented in place.  The patella  was held with a clamp.  A trial 12.5 insert was placed and the knee held  in full extension and all extruded cement removed.  When the cement had  fully hardened, then the wound was copiously irrigated with saline  solution and the trial removed.  I placed the FloSeal in the posterior  capsule and then placed the 12.5-mm posterior stabilized rotating  platform insert and tibial tray.  FloSeal was then placed in the medial  and lateral gutters and suprapatellar area.  A moist sponge was placed,  and the tourniquet was released at a total time of 34 minutes.  The  sponge was held for 2 minutes, removed, and then minimal bleeding was  stopped with electrocautery.  The wound were again irrigated and the  extensor mechanism closed with interrupted #1 PDS.  Flexion against  gravity was 135 degrees.  The subcu was closed with interrupted 2-0  Vicryl and subcuticular running 4-0 Monocryl.  The catheter for Marcaine  pain pump was placed and the pump was initiated.  Steri-Strips and a  bulky sterile dressing were applied, and she was placed into a knee  immobilizer, awakened, and transported to recovery in stable condition.      Ollen Gross, M.D.  Electronically Signed     FA/MEDQ  D:  10/31/2007  T:  11/01/2007  Job:  [id number]

## 2011-05-03 NOTE — Assessment & Plan Note (Signed)
[facility]                            CARDIOLOGY OFFICE NOTE   ZURIEL, YEAMAN                      MRN:          [id number]  DATE:01/09/2008                            DOB:          1936-06-06    Ms. Oyster is a 75 year old female with history of diabetes,  hypertension, hyperlipidemia who I initially evaluated on December 07, 2007 for syncope.  Please refer my note for details.  Since then, she  denies any dyspnea, chest pain, palpitations or recurrent syncope.  She  feels that she was most likely dehydrated at a time her previous  syncopal episodes happened.  We did perform a Myoview on December 21, 2007  that showed an ejection fraction of 76% and normal perfusion.  She also  had an echocardiogram on December 21, 2007 that showed a normal ejection  fraction.  Left atrium was mild to moderately dilated, but there was no  other valvular disease noted other than mild tricuspid regurgitation.  She also had a CardioNet monitor performed that showed sinus rhythm with  PACs and also sinus tachycardia.   CURRENT MEDICATIONS:  1. Vitamin B complex.  2. Multivitamin.  3. Benicar 20 mg p.o. daily.  4. Seroquel.  5. Cymbalta.  6. Lipitor 40 mg tablets one half daily.  7. Aspirin 81 mg daily.   PHYSICAL EXAM:  Today shows a blood pressure of 180/89 and pulse is 97.  HEENT:  Normal.  NECK:  Supple.  CHEST:  is clear.  CARDIOVASCULAR:  Regular rate and rhythm.  ABDOMEN:  Shows no tenderness.  Her extremities show no edema.   DIAGNOSIS:  1. Recent syncope - She attributes this to dehydration.  We have not      elucidated any significant cardiac pathology.  Her Myoview showed      no ischemia or infarction and her echocardiogram showed normal left      ventricular function.  Her CardioNet monitor showed no significant      arrhythmia.  We have elected not to pursue further cardiac workup      at this point.  We can reconsider this if she has further  episodes      in the future.  2. Hypertension - Blood pressure is elevated today.  However, Benicar      was recently decreased and her hydrochlorothiazide was      discontinued.  She is scheduled to follow up Dr. Cathey Endow next week.      I have asked her to track her blood pressure at home and take her      records with her to see Dr. Cathey Endow.  Her hydrochlorothiazide could      be resumed versus increasing her Benicar at that time.  3. Hyperlipidemia - She will continue on her statin and this is being      monitored by Dr. Cathey Endow.  4. Diabetes mellitus.   I will see back on as-needed basis.     Madolyn Frieze Jens Som, MD, [facility]  Electronically Signed    BSC/MedQ  DD: 01/09/2008  DT: 01/09/2008  Job #: [id number]  cc:   Seymour Bars, D.O.

## 2011-05-06 NOTE — Discharge Summary (Signed)
NAMEADLEAN, Christine Washington               ACCOUNT NO.:  [id number]   MEDICAL RECORD NO.:  [id number]          PATIENT TYPE:  INP   LOCATION:  5009                         FACILITY:  [facility]   PHYSICIAN:  Lubertha Basque. Dalldorf, M.D.DATE OF BIRTH:  [birth year]   DATE OF ADMISSION:  10/19/2004  DATE OF DISCHARGE:  10/22/2004                                 DISCHARGE SUMMARY   ADMISSION DIAGNOSES:  1.  Right knee end-stage degenerative joint disease.  2.  Hypertension.  3.  Diabetes mellitus.   DISCHARGE DIAGNOSES:  1.  Right knee end-stage degenerative joint disease.  2.  Hypertension.  3.  Diabetes mellitus.   PROCEDURE:  Right total knee replacement.   HISTORY OF PRESENT ILLNESS:  The patient is a 75 year old white female  patient well-known to our practice.  We had done an arthroscopy in 1997 and  knew that she had some bone-on-bone arthritis at that time, but her symptoms  have increased to the point now that she is having pain when she walks,  trouble at nighttime.  She has failed two corticosteroids injections and we  have discussed with her the treatment plan and that being total knee  replacement with the risk of anesthesia, risk of infection, DVT, possible  death.   LABORATORY DATA:  Hemoglobin 10.6, hematocrit 30.4, INR 3.2.  Sodium 137,  potassium 3.8, glucose 110, BUN 10, creatinine 1.0, calcium 8.6.  No active  disease on a chest x-ray.   HOSPITAL COURSE:  The patient was admitted postoperatively.  She was placed  on a morphine pump low dose protocol.  She had a lactated Ringer's IV at 80  mL an hour, IV Ancef 1 gram q.8h x3 doses, Coumadin and Lovenox DVT  prophylaxis per pharmacy, Percocet for pain, and kept on her home medicines  of Crestor, Neurontin, Avandia, Nexium, Atenolol, and Effexor.  Foley  catheter also used.  Knee-high TED's, incentive spirometry, knee immobilizer  when she was up and walking, CPM machine set on 0 to 50 initially six to  eight hours a day and  advance as tolerated.  Then follow-up lab work, CBC  and BMET as dictated.  She also had physical therapy and rehab consultations  ordered.  Her first day postoperatively, her blood sugar was 119, hemoglobin  10.6, hematocrit 30.3, blood pressure 114/59, temperature 98, heart rate 77.  Lungs were clear.  Abdomen was soft.  Foley catheter was in place.  PCA was  helpful.  Hemovac had drained 360 and was actually an Autovac and was  returned to her.  Her knee had no sign of infection or irritation and she  was doing well.  Physical therapy was getting her up and weightbearing as  tolerated.  The second postoperatively, blood pressure remained within  normal range of 109/62, blood sugar of 109, hemoglobin 10.6, INR 3.2.  Knee  dressing was changed.  The drain had been pulled out and the wound was  benign with no sign of irritation or infection.  Normal neurovascular  status.  Good pulses distally.  The third day and fourth day  postoperatively, her vital  signs remained essentially the same.  Her knee  without sign of infection or irritation, normal neurovascular status, and  SACU or rehab consult was done and they felt she was a good candidate and  would be taken.   DIET:  ADA diabetic diet.   WOUND CARE:  Dressing can be changed on a daily basis.   ACTIVITY:  Weightbearing as tolerated with the help of physical therapy.   FOLLOW UP:  We will see her back in the office at the two-week mark for  staple removal or if she is still in rehab, they would be removed at that  time.   DISCHARGE MEDICATIONS:  1.  Avandia 4 mg one a day.  2.  Neurontin 300 mg q.h.s.  3.  Atenolol 50 mg daily.  4.  Protonix 40 mg daily.  5.  Lipitor 40 mg daily.  6.  Effexor XR 150 mg daily.  7.  Avapro 300 mg daily.  8.  Percocet one or two q.4-6h p.r.n. pain.  9.  HCTZ 25 mg daily.  10. Colace p.r.n.  11. Laxative of choice.  12. Coumadin as regulated by pharmacy dose.   For any sign of infection, they  are to call us at [phone].       MC/MEDQ  D:  10/22/2004  T:  10/22/2004  Job:  [id number]

## 2011-05-06 NOTE — Op Note (Signed)
NAMEMEGYN, LENG               ACCOUNT NO.:  [id number]   MEDICAL RECORD NO.:  [id number]          PATIENT TYPE:  INP   LOCATION:  2550                         FACILITY:  [facility]   PHYSICIAN:  Lubertha Basque. Dalldorf, M.D.DATE OF BIRTH:  [birth year]   DATE OF PROCEDURE:  10/19/2004  DATE OF DISCHARGE:                                 OPERATIVE REPORT   PREOPERATIVE DIAGNOSES:  Right knee degenerative arthritis.   POSTOPERATIVE DIAGNOSES:  Right knee degenerative arthritis.   OPERATION PERFORMED:  Right total knee replacement.   SURGEON:  Lubertha Basque. Jerl Santos, M.D.   ASSISTANT:  1.  Deidre Ala, M.D.  2.  Prince Rome, P.A.   ANESTHESIA:  General.   INDICATIONS FOR PROCEDURE:  The patient is a 75 year old woman with a long  history of right knee degenerative arthritis.  This has persisted despite  oral anti-inflammatories and various injectables.  She had an arthroscopy  about 8 years ago at which point we saw some fairly advanced degeneration.  At this point she has pain at rest and pain which limits her ability to  remain active and she is offered a knee replacement operation.  Informed  operative consent was obtained after discussion of possible complications of  reaction to anesthesia, infection, DVT, PE, and death.   DESCRIPTION OF PROCEDURE:  The patient was taken to the operating suite  where general anesthetic was applied without difficulty.  The patient was  positioned supine and prepped and draped in the normal sterile fashion.  After the administration of IV antibiotics, the right leg was elevated,  exsanguinated, and a tourniquet inflated about the thigh.  A longitudinal  anterior incision was made with dissection down to the extensor mechanism.  All appropriate anti-infective measures were used including closed, hooded  exhaust systems for each member of the surgical team, preoperative  intravenous antibiotics and Betadine impregnated drape.  The extensor  mechanism was well visualized and a medial parapatellar incision was made  through the structure.  The knee cap was flipped and the knee flexed.  She  had severe degenerative arthritis medial and patellofemoral, but good bone  quality.  We removed some residual meniscal tissues and the ACL and PCL.  The tibia was exposed.  Extramedullary guide was placed on this bone in  order to create a cut on the proximal tibia roughly parallel with the floor  with a slight posterior tilt.  The femur sized to a standard plus component  and we placed an intramedullary guide in order to make anterior posterior  cuts on the femur creating a flexion gap of 10 mm.  I then placed a second  intramedullary guide in the femur to make a distal cut creating an equal  extension gap of 10 mm.  We had to do minimal soft tissue work to balance  the knee.  The femur sized to a standard plus and the tibia to a 4 and the  appropriate guides were placed and utilized.  The patella was cut down in  thickness by about 8 mm down to 14 followed by use of a  size 35 guide to  make the appropriate drill holes.  Trial components were removed followed by  pulsatile lavage irrigation of all cut bony surfaces.  Cement was mixed  including antibiotic and this was pressurized on all three bones.  We then  placed Depuy LCS components which were a standard plus femur, 4 tibia, 35  all polyethylene patella and 10 mm deep dish spacer.  Excess cement was  trimmed and pressure was held on the components until the cement had  hardened.  The knee easily came to full extension and flexed past 110  degrees.  The patella tracked well and no lateral release was required.  The  tourniquet was deflated and a small amount of bleeding was easily controlled  with Bovie cautery.  The knee was again irrigated followed by placement of a  drain exiting superolaterally.  I reapproximated the extensor mechanism with  #1 Vicryl in interrupted fashion.   Subcutaneous tissue was reapproximated  with 0 and 2-0 undyed Vicryl followed by skin closure with staples.  We then  placed Adaptic and a dry gauze dressing with a loose Ace wrap.  Estimated  blood loss and intraoperative fluids as well as accurate tourniquet time can  be obtained from anesthesia records.   DISPOSITION:  The patient was extubated in the operating room and taken to  the recovery room in stable condition.  Plans were for the patient to be  admitted to the orthopedic surgery service for appropriate postoperative  care to include perioperative antibiotics and Coumadin plus Lovenox for DVT  prophylaxis.       PGD/MEDQ  D:  10/19/2004  T:  10/19/2004  Job:  [id number]

## 2011-05-06 NOTE — Op Note (Signed)
Christine Washington, Christine Washington               ACCOUNT NO.:  [id number]   MEDICAL RECORD NO.:  [id number]          PATIENT TYPE:  AMB   LOCATION:  DSC                          FACILITY:  [facility]   PHYSICIAN:  Lubertha Basque. Dalldorf, M.D.DATE OF BIRTH:  01-04-1936   DATE OF PROCEDURE:  01/03/2006  DATE OF DISCHARGE:                                 OPERATIVE REPORT   PREOP DIAGNOSES:  1.  Left knee torn medial meniscus.  2.  Left knee degenerative joint disease.   POSTOPERATIVE DIAGNOSES:  1.  Left knee torn medial meniscus.  2.  Left knee degenerative joint disease.   PROCEDURES:  1.  Left knee arthroscopic partial medial meniscectomy.  2.  left knee abrasion chondroplasty.   ANESTHESIA:  Knee block MAC.   ATTENDING SURGEON:  Lubertha Basque. Dalldorf, M.D.   ASSISTANTHarolyn Rutherford PA   INDICATIONS FOR PROCEDURE:  The patient is a 75 year old woman who is status  post a knee replacement on the opposite side but has developed pain in this  knee over many months. This has persisted despite oral anti-inflammatories  and rest as well as several cortisone injections. She has undergone a  Doppler study to rule out DVT. By x-ray she had some moderate joint space  narrowing and is presumed to have a medial meniscus tear as well as some  degeneration. She is offered an arthroscopy in hopes of temporizing things  and delaying the knee replacement surgery which she will probably need in  the future. Informed operative consent was obtained after discussion of  possible complications of reaction to anesthesia and infection.   DESCRIPTION OF PROCEDURE:  The patient was taken to the operating suite  where knee block and MAC were applied. She was positioned supine and prepped  and draped in normal sterile fashion. After administration of preop IV  Kefzol, an arthroscopy of the left knee was performed through two inferior  portals. Suprapatellar pouch was benign and the patellofemoral joint looked  benign as well. The  medial compartment had grade 4 change on a broad area of  both the tibia and femur. Thorough chondroplasties were done with focal area  of abrasion to bleeding bone. She had a degenerative tear of the middle and  posterior horns of the medial meniscus and about 10 or 15% partial medial  meniscectomy was done back to stable tissues. The ACL appeared intact. The  lateral compartment exhibited some small free edge tearing of the lateral  meniscus addressed with a brief partial lateral meniscectomy. There were no  significant degenerative changes in the lateral compartment. The knee was  thoroughly irrigated followed by placement of Marcaine and Depo-Medrol.  Adaptic was placed over the portals followed by dry gauze and loose Ace  wrap. Estimated blood loss and intraoperative fluids can be obtained from  anesthesia records.   DISPOSITION:  The patient was taken to recovery room in stable addition.  Plans were for her to go home same day and to follow up in the office in  less than a week. I will contact her by phone tonight.  Lubertha Basque Jerl Santos, M.D.  Electronically Signed     PGD/MEDQ  D:  01/03/2006  T:  01/03/2006  Job:  [id number]

## 2011-05-06 NOTE — Discharge Summary (Signed)
Christine Washington, Christine Washington               ACCOUNT NO.:  [id number]   MEDICAL RECORD NO.:  [id number]          PATIENT TYPE:  IPS   LOCATION:  4527                         FACILITY:  [facility]   PHYSICIAN:  Ellwood Dense, M.D.   DATE OF BIRTH:  March 03, 1936   DATE OF ADMISSION:  10/22/2004  DATE OF DISCHARGE:  10/27/2004                                 DISCHARGE SUMMARY   DISCHARGE DIAGNOSES:  1.  Right total knee replacement secondary to degenerative joint disease      October 19, 2004.  2.  Pain management.  3.  Coumadin for deep venous thrombosis prophylaxis.  4.  Anemia.  5.  Non-insulin-dependent diabetes mellitus.  6.  Hypertension.  7.  Hyperlipidemia.  8.  Sleep apnea.   HISTORY OF PRESENT ILLNESS:  A 75 year old white female admitted October 19, 2004, with end-stage changes of the right knee and no relief with  conservative care.  He underwent a right total knee replacement October 19, 2004, per Dr. Jerl Santos.  Placed on Coumadin for deep venous thrombosis  prophylaxis weightbearing as tolerated.  Postoperative pain management with  PCA discontinued October 21, 2004.  No chest pain, no nausea or vomiting.  Admitted for comprehensive rehab program.   ALLERGIES:  ACE INHIBITORS.   MEDICATIONS PRIOR TO ADMISSION:  1.  Crestor.  2.  Neurontin.  3.  Avandia.  4.  Nexium.  5.  Aspirin.  6.  Atenolol.  7.  Benicar.  8.  Hydrochlorothiazide.  9.  Effexor.  10. B12 monthly injections.   PAST MEDICAL HISTORY:  See discharge diagnoses.   SOCIAL HISTORY:  Lives with disabled sister in [place], [name].  One level home with a ramp.  Sister with multiple sclerosis and wheelchair  bound.  Local family check as needed.   HOSPITAL COURSE:  The patient with progressive gains while in rehab services  with therapies initiated daily.  The following issues are followed during  the patient's rehab course.  Pertaining to Christine Washington right total knee  replacement, surgical site  healing nicely.  Staples intact.  No signs of  infection.  CPM machine 0. to 90 degrees.  She was weightbearing as  tolerated, ambulating supervision throughout the rehab unit.  Home health  therapies had been arranged with [facility].  She would follow up  with Dr. Jerl Santos for removal of staples.  She remained on Vicodin as needed  for pain with good results, Coumadin for deep venous thrombosis prophylaxis  with latest INR of 2.4 which would be completed by [facility].  The  patient was advised to resume her aspirin therapy after Coumadin completed.  Blood sugar remained within acceptable range at 105, 93 and 99.  She  continued on her Avandia as well as diabetic diet.  Blood pressures  controlled with home regimen of Tenormin with Benicar and  hydrochlorothiazide.  She had no bowel or bladder disturbances.  She had a  documented history of sleep apnea although she was not using a CPAP machine  at home.  Her oxygen saturations at night were 92%.  Overall for her  functional mobility, she was independent with bed mobility, supervision  transfers, supervision ambulation 125 feet x2 with a rolling walker,  supervision for steps, supervision for activities of daily living.   DISCHARGE MEDICATIONS AT TIME OF DICTATION:  1.  Coumadin 2.5 mg daily to be completed on November 18, 2004.  2.  Effexor 150 mg daily.  3.  Crestor 20 mg daily.  4.  Avandia 4 mg daily.  5.  Nexium 40 mg daily.  6.  Neurontin 300 mg at bedtime.  7.  Benicar with hydrochlorothiazide one tablet daily.  8.  Atenolol 50 mg daily.  9.  Vicodin one or two tablets q.6h. p.r.n. pain.  10. Tylenol as needed.   ACTIVITY:  As tolerated.   DIET:  Diabetic diet.   SPECIAL INSTRUCTIONS:  Home health nurse per [facility] to check INR  on Friday, October 29, 2004.  Follow up with Dr. Jerl Santos in one week for  removal of staples.       DA/MEDQ  D:  10/26/2004  T:  10/26/2004  Job:  [id number]   cc:    Lubertha Basque. Jerl Santos, M.D.  [address]  [place]  [place] [address]  Fax: [phone]   Doris Cheadle. Foy Guadalajara, M.D.  [address] [place]  [place] [address]  Fax: [phone]

## 2011-05-06 NOTE — Discharge Summary (Signed)
NAMESHALIMAR, Christine Washington               ACCOUNT NO.:  [id number]   MEDICAL RECORD NO.:  [id number]          PATIENT TYPE:  INP   LOCATION:  5009                         FACILITY:  [facility]   PHYSICIAN:  Lubertha Basque. Dalldorf, M.D.DATE OF BIRTH:  June 01, 1936   DATE OF ADMISSION:  10/19/2004  DATE OF DISCHARGE:  10/22/2004                                 DISCHARGE SUMMARY   ADMISSION DIAGNOSES:  1.  Right knee degenerative joint disease.  2.  Hypertension.  3.  Diabetes mellitus type 2.   DISCHARGE DIAGNOSES:  1.  Right knee degenerative joint disease.  2.  Hypertension.  3.  Diabetes mellitus type 2.   OPERATION:  Right total knee replacement.   BRIEF HISTORY:  Christine [place] is a 75 year old female well-known to our  practice who was having right knee pain that has increased over the last  number of months.  She did have an arthroscopy in 1997 and which did show  some degenerative changes at that time.  She has been treated subsequently  with corticosteroid injections, viscosupplementation.  This had minimal to  no help.  Now once again having night pain, trouble walking, being  comfortable.  We discussed treatment options with the patient that being  total knee replacement and the risks of anesthesia, infection, DVT and  possible death are discussed with her as well.   PERTINENT LABORATORY AND X-RAY DATA:  She had an EKG that showed sinus  bradycardia.  INR of 3.2 and she was on low-dose Coumadin protocol.  CBC  with hemoglobin on the last posting of 10.6, hematocrit 30.4, WBCs of 8.  Sodium of 137, potassium 3.8, glucose 118, BUN 10, creatinine 1, and calcium  at 8.6.   HOSPITAL COURSE:  The patient was admitted postoperatively and placed on a  variety of p.o. and IM medicines.  She had an IV of lactated ringers.  Postoperatively, she also had IV Ancef 1 g q.8h. x3 doses, morphine PCA pump  standard protocol low-dose, Coumadin and Lovenox per pharmacy protocol,  Skelaxin as a muscle  relaxant, ice pack to her knee, incentive spirometer  q.1h., Foley to straight drain, knee-hi TEDS, overhead frame, knee  immobilizer on her right knee when walking--may discontinue on the 3rd day  postop, CPM machine 0-50 and advance as tolerated 6-8 hours a day,  weightbearing as tolerated.  Follow up lab work CBC, BMET as stated above  with serial pro times for Coumadin regulation.  She was also kept on Crestor  20 mg, Neurontin 300 mg q.p.m., Avandia 4 mg q.a.m., Nexium 40 mg daily,  atenolol 50 mg daily, Benicar HCT 40/25 daily, Effexor 150 mg daily.  In the  hospital her first day postop, her blood pressure was 114/59, temperature  98, heart rate of 77, glucose of 119, hemoglobin at 10.6, hematocrit 30.3,  lungs were clear, abdomen was soft with positive bowel sounds, Foley  catheter was in place, she was using her PCA pump.  Her Hemovac had put out  360 and blood was replaced through the Autov[employer] protocol.  Knee showed no  sign of infection, good  neurovascular status with motion on the CPM of 0-50  degrees.  The second day postop, blood pressure 109/62, hemoglobin 10.6, INR  3.2, glucose of 109, lungs clear, abdomen soft, knee dressing and drain  pulled and changed.  The wound was benign with no sign of infection or  irritation, calf nontender with good pulses distally.  We also stopped her  Foley and her PCA and IV at that time as well.  The third day postop her  vital signs remained stable, she had good neurovascular status to her toes,  she was working with physical therapy, and pharmacy was working well with  her in regards to her DVT prophylaxis with Lovenox and Coumadin.  She was  discharged to [facility] on this date.   CONDITION ON DISCHARGE:  Improved.   FOLLOW UP:  She will be on an ADA diet.  Continue on her home medications  which are Crestor 20 mg a day, Neurontin 300 mg q.p.m., Avandia 4 mg q.a.m.,  Nexium 40 mg daily, atenolol 50 mg daily, Benicar HCT 40/25 daily, Effexor   150 mg daily, Percocet for pain 1 or 2 q.4-6h. p.r.n., Colace 100 mg p.o.  b.i.d., Coumadin per pharmacy protocol for DVT prophylaxis.  Continue with  physical therapy, weightbearing as tolerated, may change her dressing on a  regular basis, continue CPM machine 6-8 hours a day advancing as tolerated.  Staples to be removed at the 2 week mark and we would follow her up in [facility]  or 2 weeks post surgery in our office.  Any sign of infection or irritation,  she is to call [phone].      [place]   MC/MEDQ  D:  11/23/2004  T:  11/23/2004  Job:  [id number]

## 2011-06-06 ENCOUNTER — Other Ambulatory Visit: Payer: Self-pay | Admitting: Family Medicine

## 2011-06-23 ENCOUNTER — Other Ambulatory Visit: Payer: Self-pay | Admitting: Family Medicine

## 2011-07-01 ENCOUNTER — Other Ambulatory Visit: Payer: Self-pay | Admitting: Family Medicine

## 2011-07-27 ENCOUNTER — Encounter: Payer: Self-pay | Admitting: Family Medicine

## 2011-07-27 ENCOUNTER — Ambulatory Visit (INDEPENDENT_AMBULATORY_CARE_PROVIDER_SITE_OTHER): Payer: Medicare Other | Admitting: Family Medicine

## 2011-07-27 DIAGNOSIS — F329 Major depressive disorder, single episode, unspecified: Secondary | ICD-10-CM

## 2011-07-27 DIAGNOSIS — F3289 Other specified depressive episodes: Secondary | ICD-10-CM

## 2011-07-27 DIAGNOSIS — E119 Type 2 diabetes mellitus without complications: Secondary | ICD-10-CM

## 2011-07-27 LAB — POCT GLYCOSYLATED HEMOGLOBIN (HGB A1C): Hemoglobin A1C: 6.1

## 2011-07-27 NOTE — Assessment & Plan Note (Signed)
A1c is 6.1 in the IFG range , down from 6.5, off meds.  Continue healthy diet, regular exercise and repeat in 6 mos.

## 2011-07-27 NOTE — Patient Instructions (Signed)
A1C looks great at 6.1.  Labs are Up to date.  Continue current meds.  Return for a flu shot this Fall and for f/u mood in 4 mos.

## 2011-07-27 NOTE — Assessment & Plan Note (Signed)
She is doing great on meds and is dealing with stressors much better.  Has hospice for respit care and family support.

## 2011-07-27 NOTE — Progress Notes (Signed)
  Subjective:    Patient ID: Christine [place], female    DOB: May 28, 1936, 75 y.o.   MRN: [id number]  HPI  75 yo WF presents for f/u visit.  She has IFG/ T2DM.  Today her A1C is 6.1  And she is not on anything for her meds.  She is sleeping 'too long' at night.  She has depression, on Cymblata.  She is on Seroquel at night with prn use of Xanax.  She has been taking her Seroquel at 10:30 and has a hard time waking up in the morning.  She went for counseling one time.  It has helped that she has had respit care.    BP 116/70  Pulse 84  Ht 5\' 6"  (1.676 m)  Wt 185 lb (83.915 kg)  BMI 29.86 kg/m2  SpO2 97%   Review of Systems  Constitutional: Negative for fatigue.  Respiratory: Negative for shortness of breath.   Cardiovascular: Negative for chest pain.  Psychiatric/Behavioral: Negative for dysphoric mood. The patient is not nervous/anxious.        Objective:   Physical Exam  Constitutional: She appears well-developed and well-nourished.  Neck: No thyromegaly present.  Cardiovascular: Normal rate, regular rhythm and normal heart sounds.   Pulmonary/Chest: Effort normal and breath sounds normal.  Musculoskeletal: She exhibits no edema.  Psychiatric: She has a normal mood and affect.          Assessment & Plan:

## 2011-08-14 ENCOUNTER — Other Ambulatory Visit: Payer: Self-pay | Admitting: Family Medicine

## 2011-08-31 ENCOUNTER — Telehealth: Payer: Self-pay | Admitting: Family Medicine

## 2011-08-31 MED ORDER — AMLODIPINE BESYLATE 5 MG PO TABS: 5.0000 mg | ORAL_TABLET | Freq: Every day | ORAL | Status: DC

## 2011-08-31 MED ORDER — QUETIAPINE FUMARATE 50 MG PO TABS: 50.0000 mg | ORAL_TABLET | Freq: Every day | ORAL | Status: DC

## 2011-08-31 NOTE — Telephone Encounter (Signed)
Pt called for refills of seroquel, and amlodipine . Plan:  Refilled both meds and sent to pharm electronically. Jarvis Newcomer, LPN [facility]

## 2011-09-27 LAB — CBC
HCT: 29.7 — ABNORMAL LOW
HCT: 31.2 — ABNORMAL LOW
HCT: 38.2
Hemoglobin: 9.8 — ABNORMAL LOW
Hemoglobin: 10.3 — ABNORMAL LOW
Hemoglobin: 13.2
MCHC: 34.6
MCHC: 34.7
MCV: 83.7
MCV: 84
MCV: 84.5
RBC: 3.39 — ABNORMAL LOW
RBC: 3.69 — ABNORMAL LOW
RBC: 4.57
RDW: 13.8
RDW: 13.6
WBC: 7.8
WBC: 11.2 — ABNORMAL HIGH
WBC: 7.5

## 2011-09-27 LAB — BASIC METABOLIC PANEL
BUN: 13
CO2: 31
CO2: 34 — ABNORMAL HIGH
Chloride: 102
GFR calc Af Amer: >60
Glucose, Bld: 126 — ABNORMAL HIGH
Potassium: 3.7
Potassium: 4.7
Sodium: 137

## 2011-09-27 LAB — COMPREHENSIVE METABOLIC PANEL
AST: 26
CO2: 32
Chloride: 106
Creatinine, Ser: 1.12
GFR calc Af Amer: 58 — ABNORMAL LOW
GFR calc non Af Amer: 48 — ABNORMAL LOW
Glucose, Bld: 102 — ABNORMAL HIGH
Total Bilirubin: 0.8

## 2011-09-27 LAB — URINALYSIS, ROUTINE W REFLEX MICROSCOPIC
Bilirubin Urine: NEGATIVE
Glucose, UA: NEGATIVE
Hgb urine dipstick: NEGATIVE
Specific Gravity, Urine: 1.019
Urobilinogen, UA: 0.2
pH: 5

## 2011-09-27 LAB — ABO/RH: ABO/RH(D): A POS

## 2011-09-27 LAB — PROTIME-INR
INR: 2 — ABNORMAL HIGH
INR: 2.8 — ABNORMAL HIGH
Prothrombin Time: 26.3 — ABNORMAL HIGH
Prothrombin Time: 13.5

## 2011-09-27 LAB — APTT: aPTT: 32

## 2011-09-29 ENCOUNTER — Other Ambulatory Visit: Payer: Self-pay | Admitting: *Deleted

## 2011-09-29 MED ORDER — OLMESARTAN MEDOXOMIL-HCTZ 40-12.5 MG PO TABS: 1.0000 | ORAL_TABLET | Freq: Every day | ORAL | Status: DC

## 2011-10-03 ENCOUNTER — Other Ambulatory Visit: Payer: Self-pay | Admitting: *Deleted

## 2011-10-03 MED ORDER — ALPRAZOLAM 0.25 MG PO TABS: 0.2500 mg | ORAL_TABLET | Freq: Two times a day (BID) | ORAL | Status: DC | PRN

## 2011-10-04 LAB — COMPREHENSIVE METABOLIC PANEL
Albumin: 3.5
BUN: 36 — ABNORMAL HIGH
Chloride: 98
Creatinine, Ser: 1.49 — ABNORMAL HIGH
Total Bilirubin: 1.1

## 2011-10-04 LAB — CBC
HCT: 42.9
MCV: 85.5
Platelets: 292
RDW: 13.8

## 2011-10-04 LAB — DIFFERENTIAL
Basophils Absolute: 0
Lymphocytes Relative: 16
Monocytes Absolute: 1.5 — ABNORMAL HIGH
Neutro Abs: 13.1 — ABNORMAL HIGH

## 2011-10-04 LAB — LIPASE, BLOOD: Lipase: 21

## 2011-10-04 LAB — POCT CARDIAC MARKERS
CKMB, poc: 1.3
Myoglobin, poc: 54.7
Operator id: 282201

## 2011-10-10 ENCOUNTER — Telehealth: Payer: Self-pay | Admitting: Family Medicine

## 2011-10-10 NOTE — Telephone Encounter (Signed)
Pt called for refill of her cymbalta 60 mg. Plan:  Reviewed the pt chart, and it is too early for refill.  Not due til end of week.  Pt informed to call at end of week and will do. Jarvis Newcomer, LPN [facility]

## 2011-10-14 ENCOUNTER — Other Ambulatory Visit: Payer: Self-pay | Admitting: Family Medicine

## 2011-10-14 MED ORDER — DULOXETINE HCL 60 MG PO CPEP: 60.0000 mg | ORAL_CAPSULE | Freq: Every day | ORAL | Status: DC

## 2011-10-14 NOTE — Telephone Encounter (Signed)
Pt called for refills of her cymbalta 60 mg, and alprazolam . Plan:  Refilled cymbalta 60 mg PO QD # 30/0 refills.  Alprazolam was already sent to pt pharm on 10-03-2011.  Too early for refill of this medication.  Pt informed. Jarvis Newcomer, LPN [facility]

## 2011-10-21 ENCOUNTER — Other Ambulatory Visit: Payer: Self-pay | Admitting: *Deleted

## 2011-10-21 MED ORDER — ATORVASTATIN CALCIUM 20 MG PO TABS: 20.0000 mg | ORAL_TABLET | Freq: Every day | ORAL | Status: DC

## 2011-11-18 ENCOUNTER — Other Ambulatory Visit: Payer: Self-pay | Admitting: Family Medicine

## 2011-11-22 ENCOUNTER — Other Ambulatory Visit: Payer: Self-pay | Admitting: Family Medicine

## 2011-11-24 ENCOUNTER — Ambulatory Visit: Payer: Medicare Other | Admitting: Family Medicine

## 2011-11-29 ENCOUNTER — Ambulatory Visit: Payer: Medicare Other | Admitting: Family Medicine

## 2011-12-19 ENCOUNTER — Ambulatory Visit: Payer: Medicare Other | Admitting: Family Medicine

## 2011-12-24 ENCOUNTER — Other Ambulatory Visit: Payer: Self-pay | Admitting: Family Medicine

## 2011-12-26 ENCOUNTER — Ambulatory Visit (INDEPENDENT_AMBULATORY_CARE_PROVIDER_SITE_OTHER): Payer: Medicare Other | Admitting: Family Medicine

## 2011-12-26 ENCOUNTER — Encounter: Payer: Self-pay | Admitting: Family Medicine

## 2011-12-26 DIAGNOSIS — H8110 Benign paroxysmal vertigo, unspecified ear: Secondary | ICD-10-CM

## 2011-12-26 NOTE — Progress Notes (Signed)
  Subjective:    Patient ID: Christine [place], female    DOB: [birth year], 76 y.o.   MRN: [id number]  HPI Woke up about 5 days ago and got up to go to the bathroom and felt dizzy and then saw flashes of light. Hard to see. Stil here. Describes vertigo. Feels like has to hold the wall. No fever or ear pain.   Mild nauseated.  Sister died a couple of weeks ago.  Never had this before. No vision changes or heairng loss. No ringing. Did go to ED since was out of town.  Had head CT that was normal and normal vitals.    Review of Systems     Objective:   Physical Exam  Constitutional: She is oriented to person, place, and time. She appears well-developed and well-nourished.  HENT:  Head: Normocephalic and atraumatic.  Right Ear: External ear normal.  Left Ear: External ear normal.  Nose: Nose normal.  Mouth/Throat: Oropharynx is clear and moist.       TMs and canals are clear.   Eyes: Conjunctivae and EOM are normal. Pupils are equal, round, and reactive to light.  Neck: Neck supple. No thyromegaly present.  Cardiovascular: Normal rate, regular rhythm and normal heart sounds.   Pulmonary/Chest: Effort normal and breath sounds normal. She has no wheezes.  Lymphadenopathy:    She has no cervical adenopathy.  Neurological: She is alert and oriented to person, place, and time. No cranial nerve deficit. Coordination normal.       +diks-Hallpike manuver to the right.   Skin: Skin is warm and dry.  Psychiatric: She has a normal mood and affect.          Assessment & Plan:  BPV - I discussed her diagnosis. I also gave her handout on exercises and to try to keep the response in addition to trying to get out of it back in place. I did give her reassurance and she conservatively continue to use the meclizine that they gave her at the emergency room. She's not making significant improvement in the next 2 weeks and then call the office and we will consider formal physical therapy for vertigo. I did  let her know the meclizine is certainly optional and it will not get her better faster but may help with some of the nausea.

## 2011-12-26 NOTE — Patient Instructions (Signed)
Benign Positional Vertigo Vertigo is a feeling that you are unsteady or that you or your surroundings are moving. Benign positional vertigo (BPV) is the most common form of vertigo. Benign means it does not have a serious cause. BPV is an upset in the balance system in your middle ear. This is troublesome but usually not serious. A viral infection or head injury are common causes, but often no cause is found. It is more common as we grow older. SYMPTOMS   Sudden dizziness happens when you move your head in different directions. Some of the problems that come with this are:  Loss of balance.     Throwing up (vomiting).     Blurred vision.     Dizziness .     Feeling sick to your stomach (nauseated).  DIAGNOSIS   Your caregiver may do some specialized testing to prove what is wrong. HOME CARE INSTRUCTIONS    Rest and eat a well-balanced diet.     Move slowly and do not make sudden body or head movements.     Do not drive a car or do any activities that could hurt you or others.     Lie down and rest. Take precautions to prevent falls.  SEEK IMMEDIATE MEDICAL CARE IF:    You develop headaches which are severe or lasting.     You develop continued vomiting.     A temporary loss or change of vision appears.     You notice temporary numbness on one side of your body.     You are temporarily unable to speak.     Temporary areas of weakness develop.     You have weakness or numbness in the face, arms, or legs.     You notice dizziness or difficulty walking.     You experience slurred speech or difficulty swallowing.  MAKE SURE YOU:    Understand these instructions.     Will watch your condition.     Will get help right away if you are not doing well or get worse.  Document Released: 09/12/2006 Document Revised: 06/20/2011 Document Reviewed: 09/21/2006 [facility] Patient Information 2012 [place], [place].

## 2011-12-28 ENCOUNTER — Ambulatory Visit (INDEPENDENT_AMBULATORY_CARE_PROVIDER_SITE_OTHER): Payer: Medicare Other | Admitting: Family Medicine

## 2011-12-28 ENCOUNTER — Encounter: Payer: Self-pay | Admitting: Family Medicine

## 2011-12-28 VITALS — BP 149/72 | HR 87 | Temp 98.4°F | Wt 183.0 lb

## 2011-12-28 DIAGNOSIS — Z23 Encounter for immunization: Secondary | ICD-10-CM

## 2011-12-28 DIAGNOSIS — E1142 Type 2 diabetes mellitus with diabetic polyneuropathy: Secondary | ICD-10-CM

## 2011-12-28 DIAGNOSIS — E1140 Type 2 diabetes mellitus with diabetic neuropathy, unspecified: Secondary | ICD-10-CM | POA: Insufficient documentation

## 2011-12-28 DIAGNOSIS — E119 Type 2 diabetes mellitus without complications: Secondary | ICD-10-CM

## 2011-12-28 DIAGNOSIS — E1149 Type 2 diabetes mellitus with other diabetic neurological complication: Secondary | ICD-10-CM

## 2011-12-28 LAB — POCT UA - MICROALBUMIN

## 2011-12-28 MED ORDER — METFORMIN HCL 500 MG PO TABS: 500.0000 mg | ORAL_TABLET | Freq: Every day | ORAL | Status: DC

## 2011-12-28 NOTE — Progress Notes (Signed)
  Subjective:    Patient ID: Christine [place], female    DOB: 03/12/1936, 76 y.o.   MRN: [id number]  Diabetes She presents for her follow-up diabetic visit. She has type 2 diabetes mellitus. Her disease course has been stable. There are no hypoglycemic associated symptoms. Pertinent negatives for diabetes include no blurred vision, no chest pain, no foot paresthesias, no foot ulcerations, no polydipsia, no polyphagia, no polyuria and no weight loss. Symptoms are stable. Current diabetic treatment includes diet. She is compliant with treatment all of the time. She is following a generally healthy diet. She rarely participates in exercise. Her breakfast blood glucose range is generally 110-130 mg/dl. An ACE inhibitor/angiotensin II receptor blocker is being taken. She sees a podiatrist.Eye exam is current.      Review of Systems  Constitutional: Negative for weight loss.  Eyes: Negative for blurred vision.  Cardiovascular: Negative for chest pain.  Genitourinary: Negative for polyuria.  Hematological: Negative for polydipsia and polyphagia.       Objective:   Physical Exam  Constitutional: She is oriented to person, place, and time. She appears well-developed.  HENT:  Head: Normocephalic and atraumatic.  Neck: Neck supple. No thyromegaly present.  Cardiovascular: Normal rate and regular rhythm.        2/6 systolic ejection murmur.  Pulmonary/Chest: Effort normal and breath sounds normal.  Lymphadenopathy:    She has no cervical adenopathy.  Neurological: She is alert and oriented to person, place, and time.  Skin: Skin is warm and dry.  Psychiatric: She has a normal mood and affect. Her behavior is normal.          Assessment & Plan:  DM- A1C is 6.5 today. At goal. F/U in 3 months. MAke sure eating healthy and work on exercise.  She has been primarily diet controlled at this point. Urine micro and foot exam performed today. BP up today but normally well controlled.   It was normla  2 days ago on her meds. Says she hasn't taken her meds yet today. Is fasting.  Work on low salt diet. Will start metformin 500mg  once a day since some hx of neuropathy per her podiatrist.  Lab Results  Component Value Date   CHOL 164 04/14/2011   HDL 54 04/14/2011   LDLCALC 85 04/14/2011   TRIG 123 04/14/2011   CHOLHDL 3.0 04/14/2011    Tdap updated.

## 2012-01-11 ENCOUNTER — Other Ambulatory Visit: Payer: Self-pay | Admitting: Family Medicine

## 2012-01-21 ENCOUNTER — Other Ambulatory Visit: Payer: Self-pay | Admitting: Family Medicine

## 2012-02-10 ENCOUNTER — Encounter: Payer: Self-pay | Admitting: Internal Medicine

## 2012-02-20 ENCOUNTER — Other Ambulatory Visit: Payer: Self-pay | Admitting: *Deleted

## 2012-02-20 ENCOUNTER — Encounter: Payer: Self-pay | Admitting: Internal Medicine

## 2012-02-20 ENCOUNTER — Other Ambulatory Visit: Payer: Self-pay | Admitting: Family Medicine

## 2012-02-20 MED ORDER — QUETIAPINE FUMARATE 50 MG PO TABS: 50.0000 mg | ORAL_TABLET | Freq: Every day | ORAL | Status: DC

## 2012-03-01 ENCOUNTER — Encounter: Payer: Self-pay | Admitting: Family Medicine

## 2012-03-01 ENCOUNTER — Ambulatory Visit (INDEPENDENT_AMBULATORY_CARE_PROVIDER_SITE_OTHER): Payer: Medicare Other | Admitting: Family Medicine

## 2012-03-01 VITALS — BP 124/69 | HR 85 | Ht 66.0 in | Wt 182.0 lb

## 2012-03-01 DIAGNOSIS — M7700 Medial epicondylitis, unspecified elbow: Secondary | ICD-10-CM

## 2012-03-01 NOTE — Progress Notes (Signed)
  Subjective:    Patient ID: Christine [place], female    DOB: 1936/02/28, 76 y.o.   MRN: [id number]  HPI  She has had left arm pain times approximately 2 weeks. She said initially she thought she may have just pulled a muscle. It feels sore from the inner elbow down to her wrist. Also the third and fourth fingers ache as well. She denies any numbness or tingling. She says sometimes it feels like a burning sensation. No actual weakness in her hand grip or arm strength. She tried ibuprofen initially but then her stomach upset so she stopped it immediately. She wasn't sure if it helped or not. She really only took a few doses. She denies any new activities or overuse. She denies any actual trauma. It's better with rest and is worse with increased activity.  Review of Systems     Objective:   Physical Exam  She is tender over the left medial malleolus. She slightly tender going down to forearm medially. No tenderness over the wrist or lateral malleolus. No tenderness of the fingers. She did have some mild swelling in the forearm on the palmar side. No erythema. No swelling in the wris,t elbow, or fingers. She has great strength in her shoulder, elbow, wrists, fingers. She has normal range of motion of her elbow. She has great grip strength. She has some discomfort with pronation and supination but it is not significant.      Assessment & Plan:  Medial epicondylitis-based on exam and history is most consistent with medial epicondylitis. The sinuses to her. I gave her handouts and stretches to home. Encouraged ice it frequently and to avoid any heavy lifting or repetitive activities. She can certainly use Tylenol since she cannot take ibuprofen without GI upset. She has not had significant relief within the next 3 weeks and his cough is back for further evaluation. I did not order x-ray today because she has not had any significant trauma to the area. If she starts noticing any weakness in the forearm or  finger grip and she needs to let me know sooner rather than later.

## 2012-03-08 ENCOUNTER — Telehealth: Payer: Self-pay | Admitting: *Deleted

## 2012-03-08 MED ORDER — AMBULATORY NON FORMULARY MEDICATION: Status: DC

## 2012-03-08 NOTE — Telephone Encounter (Signed)
Printed. Will fax to [facility].

## 2012-03-08 NOTE — Telephone Encounter (Signed)
Pt calls and states [facility] in [place] has the shingles vaccine and needs rx sent over to them and they will administer since she has Medicare part D

## 2012-03-09 ENCOUNTER — Encounter: Payer: Self-pay | Admitting: Internal Medicine

## 2012-03-09 ENCOUNTER — Ambulatory Visit (AMBULATORY_SURGERY_CENTER): Payer: Self-pay | Admitting: *Deleted

## 2012-03-09 DIAGNOSIS — Z1211 Encounter for screening for malignant neoplasm of colon: Secondary | ICD-10-CM

## 2012-03-09 MED ORDER — PEG-KCL-NACL-NASULF-NA ASC-C 100 G PO SOLR: ORAL | Status: DC

## 2012-03-19 ENCOUNTER — Telehealth: Payer: Self-pay | Admitting: Internal Medicine

## 2012-03-19 NOTE — Telephone Encounter (Signed)
Don't charge DB

## 2012-03-21 ENCOUNTER — Encounter: Payer: Medicare Other | Admitting: Internal Medicine

## 2012-04-03 ENCOUNTER — Encounter: Payer: Self-pay | Admitting: Family Medicine

## 2012-04-03 ENCOUNTER — Ambulatory Visit (INDEPENDENT_AMBULATORY_CARE_PROVIDER_SITE_OTHER): Payer: Medicare Other | Admitting: Family Medicine

## 2012-04-03 DIAGNOSIS — Z1331 Encounter for screening for depression: Secondary | ICD-10-CM

## 2012-04-03 DIAGNOSIS — R5381 Other malaise: Secondary | ICD-10-CM

## 2012-04-03 DIAGNOSIS — R5383 Other fatigue: Secondary | ICD-10-CM

## 2012-04-03 DIAGNOSIS — Z9181 History of falling: Secondary | ICD-10-CM

## 2012-04-03 DIAGNOSIS — E119 Type 2 diabetes mellitus without complications: Secondary | ICD-10-CM

## 2012-04-03 DIAGNOSIS — G4733 Obstructive sleep apnea (adult) (pediatric): Secondary | ICD-10-CM | POA: Insufficient documentation

## 2012-04-03 LAB — COMPLETE METABOLIC PANEL WITH GFR
AST: 23 U/L (ref 0–37)
Alkaline Phosphatase: 83 U/L (ref 39–117)
GFR, Est Non African American: 63 mL/min
Glucose, Bld: 124 mg/dL — ABNORMAL HIGH (ref 70–99)
Potassium: 3.8 mEq/L (ref 3.5–5.3)
Sodium: 141 mEq/L (ref 135–145)
Total Bilirubin: 0.7 mg/dL (ref 0.3–1.2)
Total Protein: 6.9 g/dL (ref 6.0–8.3)

## 2012-04-03 LAB — CBC
HCT: 41.4 % (ref 36.0–46.0)
MCHC: 31.6 g/dL (ref 30.0–36.0)
Platelets: 241 10*3/uL (ref 150–400)
RDW: 14.6 % (ref 11.5–15.5)
WBC: 8.2 10*3/uL (ref 4.0–10.5)

## 2012-04-03 LAB — TSH: TSH: 1.757 u[IU]/mL (ref 0.350–4.500)

## 2012-04-03 MED ORDER — OLMESARTAN MEDOXOMIL-HCTZ 40-12.5 MG PO TABS: 1.0000 | ORAL_TABLET | Freq: Every day | ORAL | Status: DC

## 2012-04-03 NOTE — Patient Instructions (Addendum)
Stop your simvastatin for 2-3 weeks and see if your memory gets better. If not then restart the medicatiom. If you memory is better off the medication then call me and we can switch it.   Hold your amlodipine.  Recheck your blood pressure in one month.  I sent refills on your Benicar.    We will call you with your lab results. If you don't here from [place] in about a week then please give [place] a call at [phone].

## 2012-04-03 NOTE — Progress Notes (Addendum)
Subjective:    Patient ID: Christine Washington, female    DOB: 1935/12/23, 76 y.o.   MRN: [id number]  Diabetes She presents for her follow-up diabetic visit. She has type 2 diabetes mellitus. Her disease course has been stable. There are no hypoglycemic associated symptoms. Pertinent negatives for diabetes include no chest pain, no polydipsia, no polyphagia and no polyuria. There are no hypoglycemic complications. Symptoms are stable. She is compliant with treatment all of the time. Her weight is stable. She is following a generally healthy diet. She has not had a previous visit with a dietician. There is no change in her home blood glucose trend. An ACE inhibitor/angiotensin II receptor blocker is being taken. Eye exam is current.   Fatigue -  Says has been really tired the last 6-8 weeks. Says mentally feels ok, no depression right now but says she feels like she could sleep all day.  Says does have sleep apnea but says the mask suffocates her so she doesn' use it.  Feels fatigued when she wakes up. Wkaing up to pee about 3 times a night.    Hypertension-she is taking her medications really. No chest pain or short of breath. She denies any dizziness. She does describe some extreme fatigue  Pain in the ball-has been near persistently for the last several months. She says it actually feels a little bit better the last couple of weeks. She's been trying to rest at home. She denies any trauma rectal injury. Her pain is at the first and second distal metatarsals.   Review of Systems  Cardiovascular: Negative for chest pain.  Genitourinary: Negative for polyuria.  Hematological: Negative for polydipsia and polyphagia.       Objective:   Physical Exam  Constitutional: She is oriented to person, place, and time. She appears well-developed and well-nourished.  HENT:  Head: Normocephalic and atraumatic.  Eyes: Pupils are equal, round, and reactive to light.  Neck: Neck supple. No thyromegaly present.   Cardiovascular: Normal rate and regular rhythm.   Murmur heard.      2/6 SEM.   Pulmonary/Chest: Effort normal and breath sounds normal.  Lymphadenopathy:    She has no cervical adenopathy.  Neurological: She is alert and oriented to person, place, and time.  Skin: Skin is warm and dry.  Psychiatric: She has a normal mood and affect. Her behavior is normal.          Assessment & Plan:  DM - Controlled. She is at goal today. F/u in 3 months.  Continue current regimen. Lab Results  Component Value Date   HGBA1C 6.0 04/03/2012     Fatigue - Check for anemia, thyroid, etc.  that I strongly suspect her fatigue is from her untreated sleep apnea which has clearly had removed 20 years. I also discussed the importance of getting her reevaluated for her sleep apnea. They now make nasal pillars which may work better for her than a facemask. Also her blood pressure is on the lower in today so going to stop her Norvasc and have her follow up in one month to recheck her blood pressure. Some of her fatigue may be coming from low blood pressure.  OSA - Dis cussed really need to get her re-evaluated. Last tested about 15 years ago. She may be a great candidate for nasal pillars instead of a facemask which may work much better for her. Discussed the importance of treating her sleep apnea as it can increase her risk of heart disease  if it is untreated.  Vitamin D def - Taking 5000iu daily.  Will check level today. If she is at normal goal and she may be want to decrease to maintenance dose of about 1000 units daily.  Depression screening-PHQ 9 score of 4, negative for depression.  FAll assessment-score of 2, low risk for falls.

## 2012-04-05 ENCOUNTER — Telehealth: Payer: Self-pay | Admitting: *Deleted

## 2012-04-05 NOTE — Telephone Encounter (Signed)
I apologize.  Can hold the atorvastatin to see if memory problems improve

## 2012-04-05 NOTE — Telephone Encounter (Signed)
Pt states that you told her to stop the simivastatin but pt is on atorvastatin. Please advise.

## 2012-04-06 NOTE — Telephone Encounter (Signed)
pt informed

## 2012-04-13 ENCOUNTER — Encounter: Payer: Self-pay | Admitting: Family Medicine

## 2012-04-13 ENCOUNTER — Ambulatory Visit (INDEPENDENT_AMBULATORY_CARE_PROVIDER_SITE_OTHER): Payer: Medicare Other | Admitting: Family Medicine

## 2012-04-13 ENCOUNTER — Ambulatory Visit: Payer: Medicare Other | Admitting: Family Medicine

## 2012-04-13 ENCOUNTER — Ambulatory Visit
Admission: RE | Admit: 2012-04-13 | Discharge: 2012-04-13 | Disposition: A | Discharge status: Home / Self Care | Payer: Medicare Other | Source: Ambulatory Visit | Attending: Family Medicine | Admitting: Family Medicine

## 2012-04-13 VITALS — BP 118/65 | HR 94 | Ht 66.0 in | Wt 185.0 lb

## 2012-04-13 DIAGNOSIS — L0390 Cellulitis, unspecified: Secondary | ICD-10-CM

## 2012-04-13 DIAGNOSIS — M79609 Pain in unspecified limb: Secondary | ICD-10-CM

## 2012-04-13 DIAGNOSIS — M79643 Pain in unspecified hand: Secondary | ICD-10-CM

## 2012-04-13 DIAGNOSIS — L0291 Cutaneous abscess, unspecified: Secondary | ICD-10-CM

## 2012-04-13 DIAGNOSIS — M6530 Trigger finger, unspecified finger: Secondary | ICD-10-CM

## 2012-04-13 DIAGNOSIS — T148XXA Other injury of unspecified body region, initial encounter: Secondary | ICD-10-CM

## 2012-04-13 MED ORDER — SULFAMETHOXAZOLE-TRIMETHOPRIM 800-160 MG PO TABS: 1.0000 | ORAL_TABLET | Freq: Two times a day (BID) | ORAL | Status: AC

## 2012-04-13 NOTE — Patient Instructions (Signed)
We will call you with the xray results Complete your antibiotic Ice your knees 3- 4 times a day for about 10 min each time.

## 2012-04-13 NOTE — Progress Notes (Signed)
  Subjective:    Patient ID: Christine Washington, female    DOB: 20-Feb-1936, 76 y.o.   MRN: [id number]  HPI Larey Seat about 9 days ago walking [place].  Says hit her right knee and left knee but right is worse. Says the right has a "fever" in it. Says she feel like it looks more red now.  Has been using icepack. Took IBu initially but no longer needs it.  No problems with her knee replacement. No systemic fever. She can walk without difficulty. She just is concerned about the area on her right leg is still very swollen and red.   She is still having a lot of pain and weakness in her left hand. She has been working with a [facility]. He wanted to get x-rays of the hand. She also is unable to fully flex her fifth digit on her left hand. She says it just locks up and she can't completely bend it down unless she takes her other hand to move it. It is not particularly painful but she is noticing a lot of weakness.  Golfers elbow is better.  Pain has greatly improved.   Review of Systems     Objective:   Physical Exam  Constitutional: She appears well-developed and well-nourished.  Musculoskeletal:       She has large blue bruises over both anterior tibial plateaus. On the right she has a large subdural hematoma that is about 6-7 cm in size. It is fluctuant. There is erythema extending out from the hematoma approximately 4-5 cm. The bruising seems to be healing if it is more of a yellow or green color on both legs. She's tender purpuric areas. There is increased warmth as well. No significant trauma over the knee joint itself.          Assessment & Plan:  Hematoma w/ early cellulitis - Will start her on antibiotic Bactrim to cover for MRSA. I am concerned because she does have 2 knee replacements. Pulsated in the meantime she can ice 3-4 times a day for 10 minutes each. She can also use a light Ace wrap for  Gentle compression. Asked her to monitor for any increase in redness or she starts to  develop a fever. If that happens the skull the office immediately. I did not get x-rays of her anterior tibial plateaus today because I do think this is most likely soft tissue contusion. She is able to walk without significant difficulty.  Hand pain and trigger finger- Xrays today. I do suspect she has some significant arthritis but that she will likely need to see a hand orthopedist to evaluate her trigger finger or C. she may have a tendinitis in her fifth digit. For now she consider like a copy to her chiropractor with him she is working with.

## 2012-04-16 ENCOUNTER — Other Ambulatory Visit: Payer: Self-pay | Admitting: Family Medicine

## 2012-04-16 DIAGNOSIS — M79643 Pain in unspecified hand: Secondary | ICD-10-CM

## 2012-04-16 DIAGNOSIS — M1990 Unspecified osteoarthritis, unspecified site: Secondary | ICD-10-CM

## 2012-04-29 ENCOUNTER — Other Ambulatory Visit: Payer: Self-pay | Admitting: Family Medicine

## 2012-05-01 ENCOUNTER — Ambulatory Visit (HOSPITAL_BASED_OUTPATIENT_CLINIC_OR_DEPARTMENT_OTHER): Payer: Medicare Other | Attending: Family Medicine | Admitting: Radiology

## 2012-05-01 VITALS — Ht 66.0 in | Wt 185.0 lb

## 2012-05-01 DIAGNOSIS — G4733 Obstructive sleep apnea (adult) (pediatric): Secondary | ICD-10-CM | POA: Insufficient documentation

## 2012-05-04 ENCOUNTER — Encounter: Payer: Self-pay | Admitting: Family Medicine

## 2012-05-04 ENCOUNTER — Ambulatory Visit (INDEPENDENT_AMBULATORY_CARE_PROVIDER_SITE_OTHER): Payer: 59 | Admitting: Family Medicine

## 2012-05-04 VITALS — BP 138/70 | HR 73 | Ht 66.0 in | Wt 184.0 lb

## 2012-05-04 DIAGNOSIS — R5381 Other malaise: Secondary | ICD-10-CM

## 2012-05-04 DIAGNOSIS — M2550 Pain in unspecified joint: Secondary | ICD-10-CM

## 2012-05-04 DIAGNOSIS — R5383 Other fatigue: Secondary | ICD-10-CM

## 2012-05-04 DIAGNOSIS — T148XXA Other injury of unspecified body region, initial encounter: Secondary | ICD-10-CM

## 2012-05-04 DIAGNOSIS — E785 Hyperlipidemia, unspecified: Secondary | ICD-10-CM

## 2012-05-04 DIAGNOSIS — I10 Essential (primary) hypertension: Secondary | ICD-10-CM

## 2012-05-04 MED ORDER — PRAVASTATIN SODIUM 40 MG PO TABS: 40.0000 mg | ORAL_TABLET | Freq: Every day | ORAL | Status: DC

## 2012-05-04 MED ORDER — OLMESARTAN MEDOXOMIL-HCTZ 40-12.5 MG PO TABS: 1.0000 | ORAL_TABLET | Freq: Every day | ORAL | Status: DC

## 2012-05-04 NOTE — Progress Notes (Signed)
  Subjective:    Patient ID: Christine Washington, female    DOB: 04/28/1936, 76 y.o.   MRN: [id number]  HPI HTN- No CP ro SOB.  Taking her medication regularly. No problems. Says her Bp was 181 a cuople of days ago at another office so came into.   F/U hematoma - Healing well. Still tender a little below righ tnknee. She was placed on antibiotics for stranding cellulitis. She did complete her antibiotics and says her symptoms are much improved.  She stopped her Lipitor because of memory loss. She feels like it has slowly gotten better that she is also taking some supplements that her chiropractor placed her on. She's been taking 5000 international units of vitamin D a day.  Review of Systems     Objective:   Physical Exam  Constitutional: She is oriented to person, place, and time. She appears well-developed and well-nourished.  HENT:  Head: Normocephalic and atraumatic.  Cardiovascular: Normal rate, regular rhythm and normal heart sounds.   Pulmonary/Chest: Effort normal and breath sounds normal.  Neurological: She is alert and oriented to person, place, and time.  Skin: Skin is warm and dry.  Psychiatric: She has a normal mood and affect. Her behavior is normal.   Right leg with a approximately 4 cm healing hematoma. Distal to touch. It is much less swollen in fact the skin is actually dry and wrinkled around it. No surrounding erythema.       Assessment & Plan:  HTN- Well controlled.  Continue current regimen.  Continue the regular exercise and work on eating a low salt diet. I explained how high levels of salt can affect blood pressure.  Hyperlipidemia - will try pravastatin since felt the lipitor caused memory loss.    Hematoma-healing very well. Continue to monitor for signs of infection.   Because of her joint pain she would like to have her vitamin levels checked. I did encourage her to decrease her vitamin D to no more than 1000 units daily.

## 2012-05-09 LAB — FERRITIN: Ferritin: 45 ng/mL (ref 10–291)

## 2012-05-09 LAB — LIPID PANEL
Cholesterol: 195 mg/dL (ref 0–200)
HDL: 53 mg/dL (ref >39)
LDL Cholesterol: 118 mg/dL — ABNORMAL HIGH (ref 0–99)
Total CHOL/HDL Ratio: 3.7 Ratio
Triglycerides: 121 mg/dL (ref <150)
VLDL: 24 mg/dL (ref 0–40)

## 2012-05-09 LAB — COMPLETE METABOLIC PANEL WITH GFR
AST: 21 U/L (ref 0–37)
Albumin: 4.3 g/dL (ref 3.5–5.2)
Alkaline Phosphatase: 75 U/L (ref 39–117)
Potassium: 4.2 mEq/L (ref 3.5–5.3)
Sodium: 140 mEq/L (ref 135–145)
Total Protein: 6.5 g/dL (ref 6.0–8.3)

## 2012-05-09 LAB — VITAMIN B12: Vitamin B-12: 657 pg/mL (ref 211–911)

## 2012-05-10 ENCOUNTER — Other Ambulatory Visit: Payer: Self-pay | Admitting: *Deleted

## 2012-05-10 MED ORDER — PRAVASTATIN SODIUM 80 MG PO TABS: 80.0000 mg | ORAL_TABLET | Freq: Every evening | ORAL | Status: DC

## 2012-05-14 DIAGNOSIS — G4733 Obstructive sleep apnea (adult) (pediatric): Secondary | ICD-10-CM

## 2012-05-14 NOTE — Procedures (Signed)
Christine Washington, [name]               ACCOUNT NO.:  [id number]  MEDICAL RECORD NO.:  [id number]          PATIENT TYPE:  OUT  LOCATION:  SLEEP CENTER                 FACILITY:  [facility]  PHYSICIAN:  Barbaraann Share, MD,FCCPDATE OF BIRTH:  11-Jun[birth year]1938  DATE OF STUDY:  05/01/2012                           NOCTURNAL POLYSOMNOGRAM  REFERRING PHYSICIAN:  Nani Gasser, M.D.  LOCATION:  Sleep Lab.  REFERRING PHYSICIAN:  Nani Gasser, MD  INDICATION FOR STUDY:  Hypersomnia with sleep apnea.  EPWORTH SLEEPINESS SCORE:  8.  MEDICATIONS:  SLEEP ARCHITECTURE:  The patient had a total sleep time of 347 minutes with no slow wave sleep, but only 59 minutes of REM.  Sleep onset latency was prolonged at 55 minutes and REM onset was very prolonged at 222 minutes.  Sleep efficiency was moderately reduced at 78%.  RESPIRATORY DATA:  The patient was found to have 200 apneas and 64 obstructive hypopneas, giving her an apnea-hypopnea index of 46 events per hour.  The events occurred in all body positions and there was loud snoring noted throughout.  OXYGEN DATA:  There was O2 desaturation as low as 80% with the patient's obstructive events.  CARDIAC DATA:  Occasional PAC noted, but no clinically significant arrhythmias were seen.  MOVEMENT-PARASOMNIA:  The patient had no significant leg jerks or other abnormalities seen.  IMPRESSIONS-RECOMMENDATIONS: 1. Severe obstructive sleep apnea/hypopnea syndrome with an AHI of 46     events per hour and oxygen desaturation as low as 80%.  Treatment     for this degree of sleep apnea should focus primarily on weight     loss as     well as continuous positive airway pressure. 2. Occasional premature atrial contraction noted, but no clinically     significant arrhythmias were seen.     Barbaraann Share, MD,FCCP Diplomate, American Board of Sleep Medicine    [facility]/MEDQ  D:  05/14/2012 14:34:37  T:  05/14/2012 21:48:10  Job:  [id number]

## 2012-05-20 ENCOUNTER — Other Ambulatory Visit: Payer: Self-pay | Admitting: Family Medicine

## 2012-06-01 ENCOUNTER — Ambulatory Visit (INDEPENDENT_AMBULATORY_CARE_PROVIDER_SITE_OTHER): Payer: 59 | Admitting: Family Medicine

## 2012-06-01 ENCOUNTER — Encounter: Payer: Self-pay | Admitting: Family Medicine

## 2012-06-01 VITALS — BP 142/76 | HR 89 | Ht 66.0 in | Wt 184.0 lb

## 2012-06-01 DIAGNOSIS — F32A Depression, unspecified: Secondary | ICD-10-CM

## 2012-06-01 DIAGNOSIS — F329 Major depressive disorder, single episode, unspecified: Secondary | ICD-10-CM

## 2012-06-01 DIAGNOSIS — I10 Essential (primary) hypertension: Secondary | ICD-10-CM

## 2012-06-01 MED ORDER — LOSARTAN POTASSIUM-HCTZ 100-12.5 MG PO TABS: 1.0000 | ORAL_TABLET | Freq: Every day | ORAL | Status: DC

## 2012-06-01 NOTE — Progress Notes (Signed)
  Subjective:    Patient ID: Christine Washington, female    DOB: October 23, 1936, 76 y.o.   MRN: [id number]  HPI Still feeling depressed after the death of her sister.  Says somedays she does well and gets out and some days sits in her chair fo rhours.  Sleep is fair. Using her xanax more frequently. She says she understands the dependency with but says she feels like she needs it right now. She has not been getting out and getting any regular exercise.  HTN- No CP or SOB. Taking meds. REady to switch to losartan for cost reasons.  Not eating a low salt diet.     Review of Systems     Objective:   Physical Exam  Constitutional: She is oriented to person, place, and time. She appears well-developed and well-nourished.  HENT:  Head: Normocephalic and atraumatic.  Cardiovascular: Normal rate, regular rhythm and normal heart sounds.   Pulmonary/Chest: Effort normal and breath sounds normal.  Neurological: She is alert and oriented to person, place, and time.  Skin: Skin is warm and dry.  Psychiatric: She has a normal mood and affect. Her behavior is normal.          Assessment & Plan:  Acute situational depression/Grief reaciton - Offered counseling vs SSRi. She wants to hold off on medication and think about therapy for grief.    HTN- not well controlled today. We're going to switch her to losartan hct anyway for cost reasons. Followup in one month to recheck blood pressure. Also remind her to make sure she is eating low salt diet. We also discussed starting a walking exercise program which will help with her mood as well as her blood pressure.

## 2012-06-01 NOTE — Patient Instructions (Signed)
Grief Reaction  Grief is a normal response to the death of someone close to you. Feelings of fear, anger, and guilt can affect almost everyone who loses someone they love. Symptoms of depression are also common. These include problems with sleep, loss of appetite, and lack of energy. These grief reaction symptoms often last for weeks to months after a loss. They may also return during special times that remind you of the person you lost, such as an anniversary or birthday.  Anxiety, insomnia, irritability, and deep depression may last beyond the period of normal grief. If you experience these feelings for 6 months or longer, you may have clinical depression. Clinical depression requires further medical attention. If you think that you have clinical depression, you should contact your caregiver. If you have a history of depression and or a family history of depression, you are at greater risk of clinical depression. You are also at greater risk of developing clinical depression if the loss was traumatic or the loss was of someone with whom you had unresolved issues.    A grief reaction can become complicated by being blocked. This means being unable to cry or express extreme emotions. This may prolong the grieving period and worsen the emotional effects of the loss. Mourning is a natural event in human life. A healthy grief reaction is one that is not blocked . It requires a time of sadness and readjustment.It is very important to share your sorrow and fear with others, especially close friends and family. Professional counselors and clergy can also help you process your grief.  Document Released: 12/05/2005 Document Revised: 11/24/2011 Document Reviewed: 08/15/2006  [employer] Patient Information 2012 [employer].

## 2012-06-06 ENCOUNTER — Telehealth: Payer: Self-pay | Admitting: *Deleted

## 2012-06-06 NOTE — Telephone Encounter (Signed)
Pt is asking about results of her sleep study. Please advise.

## 2012-06-06 NOTE — Telephone Encounter (Signed)
Please let her know we have not received them but we have called to have them faxed.

## 2012-06-06 NOTE — Telephone Encounter (Signed)
Tell her we had to call today to get the results.  IT showed severe sleep apnea.  She needs to be scheduled for CPAP titration. We will get this coordinated and they should get back in touch with her.

## 2012-06-07 NOTE — Telephone Encounter (Signed)
I signed papers and andrea will fax them back to the sleep center at [facility].

## 2012-06-07 NOTE — Telephone Encounter (Signed)
Pt informed. Who does the referral for CPAP titration need to be set up with?

## 2012-06-18 ENCOUNTER — Other Ambulatory Visit: Payer: Self-pay | Admitting: *Deleted

## 2012-06-18 MED ORDER — ALPRAZOLAM 0.25 MG PO TABS: 0.2500 mg | ORAL_TABLET | Freq: Two times a day (BID) | ORAL | Status: DC | PRN

## 2012-06-19 ENCOUNTER — Other Ambulatory Visit: Payer: Self-pay | Admitting: Family Medicine

## 2012-06-27 ENCOUNTER — Ambulatory Visit: Payer: 59 | Admitting: Family Medicine

## 2012-06-29 ENCOUNTER — Ambulatory Visit: Payer: 59 | Admitting: Family Medicine

## 2012-07-04 ENCOUNTER — Ambulatory Visit (HOSPITAL_BASED_OUTPATIENT_CLINIC_OR_DEPARTMENT_OTHER): Payer: Medicare Other | Attending: Family Medicine | Admitting: Radiology

## 2012-07-04 VITALS — Ht 66.0 in | Wt 185.0 lb

## 2012-07-04 DIAGNOSIS — G4733 Obstructive sleep apnea (adult) (pediatric): Secondary | ICD-10-CM | POA: Insufficient documentation

## 2012-07-19 DIAGNOSIS — G4733 Obstructive sleep apnea (adult) (pediatric): Secondary | ICD-10-CM

## 2012-07-19 DIAGNOSIS — I491 Atrial premature depolarization: Secondary | ICD-10-CM

## 2012-07-19 NOTE — Procedures (Signed)
Christine Washington, Christine Washington               ACCOUNT NO.:  [id number]  MEDICAL RECORD NO.:  [id number]          PATIENT TYPE:  OUT  LOCATION:  SLEEP CENTER                 FACILITY:  [facility]  PHYSICIAN:  Barbaraann Share, MD,FCCPDATE OF BIRTH:  [birth year]  DATE OF STUDY:  07/04/2012                           NOCTURNAL POLYSOMNOGRAM  REFERRING PHYSICIAN:  Nani Gasser, M.D.  INDICATION FOR STUDY:  Hypersomnia with sleep apnea.  The patient has had a recent nocturnal polysomnogram that showed severe obstructive sleep apnea.  She returns for pressure optimization/titration.  EPWORTH SLEEPINESS SCORE:  8  MEDICATIONS:  SLEEP ARCHITECTURE:  The patient had a total sleep time of 276 minutes with no slow-wave sleep or REM.  Sleep onset latency was mildly prolonged at 37 minutes, and sleep efficiency was moderately reduced at 70%.  RESPIRATORY DATA:  The patient underwent a CPAP titration with a [place] full-face mask.  She required small nasal pillows and a small mouth piece as well.  She was started at a CPAP pressure of 5 cm of water, and gradually increased in order to control obstructive events and snoring.  At a CPAP pressure of 15, she continued to have significant breakthrough events, and therefore, was changed to bilevel. This was increased as high as 23/19, and the patient continued to have events in the supine position, even on this high pressure level.  She appeared to have control of her events with a CPAP pressure of 14 in the non-supine position.  Control of her obstructive sleep apnea was not obtained even on a high pressure during supine sleep.  OXYGEN DATA:  There was O2 desaturation as low as 86% with the patient's obstructive events.  CARDIAC DATA:  Rare PAC noted, but no clinically significant arrhythmias were seen.  MOVEMENT-PARASOMNIA:  The patient had moderate numbers of leg jerks with no significant sleep disruption.  There were no abnormal behavior  seen.  IMPRESSIONS-RECOMMENDATIONS: 1. Inadequate control of previously documented severe obstructive     sleep apnea even with high bilevel pressures during supine sleep.     The patient's obstructive sleep apnea was much more easily     controlled during nonsupine sleep.  It would be very difficult for     her to tolerate the pressures used during this study, and my     suggestion would be to start her on a CPAP pressure of 15, and     encouraged her to stay off her back as much as possible during     sleep.  I would also encourage her to work aggressively on weight     loss, since this will reduce her pressure needs over time.     Finally, would consider whether she could benefit from upper airway     surgery that     may decrease her pressure needs as well.  Clinical correlation is     suggested. 2. Rare PAC noted, but no clinically significant arrhythmias were     seen.     Barbaraann Share, MD,FCCP Diplomate, American Board of Sleep Medicine    [facility]/MEDQ  D:  07/19/2012 08:30:49  T:  07/19/2012 40:98:11  Job:  [id number]

## 2012-08-07 ENCOUNTER — Telehealth: Payer: Self-pay | Admitting: Family Medicine

## 2012-08-07 ENCOUNTER — Encounter: Payer: Self-pay | Admitting: Family Medicine

## 2012-08-07 ENCOUNTER — Ambulatory Visit (INDEPENDENT_AMBULATORY_CARE_PROVIDER_SITE_OTHER): Payer: 59 | Admitting: Family Medicine

## 2012-08-07 VITALS — BP 139/81 | HR 97 | Wt 182.0 lb

## 2012-08-07 DIAGNOSIS — E119 Type 2 diabetes mellitus without complications: Secondary | ICD-10-CM

## 2012-08-07 DIAGNOSIS — I10 Essential (primary) hypertension: Secondary | ICD-10-CM

## 2012-08-07 NOTE — Progress Notes (Signed)
  Subjective:    Patient ID: Christine Washington, female    DOB: 1936-05-04, 76 y.o.   MRN: [id number]  HPI HTN- Still on old BP pill. Hasn't switched to the lisonpril hct yet bc had just filled the old tab. Admits she has been eating out more often.  No CP or SOB.   DM- Sugars mostly running under 100 fasting. No hypogllycemic events.  Says has been more physically active. She's currently on metformin. She's tolerating this well without any significant side effects. No cuts or sores or wounds that are not healing well.  Review of Systems     Objective:   Physical Exam  Constitutional: She is oriented to person, place, and time. She appears well-developed and well-nourished.  HENT:  Head: Normocephalic and atraumatic.  Cardiovascular: Normal rate, regular rhythm and normal heart sounds.   Pulmonary/Chest: Effort normal and breath sounds normal.  Neurological: She is alert and oriented to person, place, and time.  Skin: Skin is warm and dry.  Psychiatric: She has a normal mood and affect. Her behavior is normal.          Assessment & Plan:  HTN- Make sure eating low salt diet. Given H.o. BP is better today . Recheck BP in 6 weeks once on  The new med for 4 weeks.   DM- Due for A1C today.  Her A1c looks fantastic. Well controlled. Continue current regimen. Followup in 4 months. Lab Results  Component Value Date   HGBA1C 5.9 08/07/2012

## 2012-08-07 NOTE — Patient Instructions (Signed)
Follow up in 4 months for diabetes.

## 2012-08-07 NOTE — Telephone Encounter (Signed)
Patient called request to know if she can have a copy of her check-out sheet mailed to her address... Thanks

## 2012-08-08 ENCOUNTER — Telehealth: Payer: Self-pay | Admitting: *Deleted

## 2012-08-08 DIAGNOSIS — M2550 Pain in unspecified joint: Secondary | ICD-10-CM

## 2012-08-08 DIAGNOSIS — G4733 Obstructive sleep apnea (adult) (pediatric): Secondary | ICD-10-CM

## 2012-08-08 NOTE — Telephone Encounter (Signed)
Pt is asking if you can refer her to a rheumatologist. States she forgot to mention it when she seen you on Tuesday.

## 2012-08-08 NOTE — Telephone Encounter (Signed)
Mailed copy of visit to patient

## 2012-08-09 NOTE — Telephone Encounter (Signed)
I can but need a reason. For what diagnosis?

## 2012-08-10 MED ORDER — AMBULATORY NON FORMULARY MEDICATION: Status: DC

## 2012-08-10 NOTE — Telephone Encounter (Signed)
Pt states she has been going to a chiropractor that told her she has arthritis. She states her hands hurt a lot and her fingers are starting to become deformed/bent. She states that if you can treat her for this that is ok or you can refer her to a rheumatologist. Pt informed that we will but in the referral for [facility] but she states that she doesn't have a CPAP machine. Do I place that order along with the referral or do I send the referral to get a CPAP elsewhere?

## 2012-08-10 NOTE — Telephone Encounter (Signed)
Rferrral placed. Order for CPAP placed.

## 2012-08-14 ENCOUNTER — Telehealth: Payer: Self-pay | Admitting: *Deleted

## 2012-08-14 NOTE — Telephone Encounter (Signed)
She was on Benicar HCT says it has hydrochlorothiazide RE in it. If she tolerated this well but I doubt that she has a true allergy the HCTZ. Have her double check her old bottle of Benicar and see if it actually says Benicar HCT.

## 2012-08-14 NOTE — Telephone Encounter (Addendum)
Pt called and states that her Benicar was changed to losartan HCTZ but she is allergic to HCTZ. Please advise

## 2012-08-15 ENCOUNTER — Other Ambulatory Visit: Payer: Self-pay | Admitting: Family Medicine

## 2012-08-15 MED ORDER — AMBULATORY NON FORMULARY MEDICATION: Status: DC

## 2012-08-16 NOTE — Telephone Encounter (Signed)
I want to know if her Benicar was Benicar hct?  HCT doesn't cause cough, but ACE inhibitor do. Which I have listed on her intolerance list.

## 2012-08-16 NOTE — Telephone Encounter (Signed)
Pt just had Losartan-HCTZ filled a couple of days ago. She states she has a paper she is looking for that she was told by another provider not to take HCTZ b/c it made her cough. She wants to know if you want her to take the Benicar or the Losartan. Please advise.

## 2012-08-17 ENCOUNTER — Other Ambulatory Visit: Payer: Self-pay | Admitting: Family Medicine

## 2012-08-17 NOTE — Telephone Encounter (Signed)
Pt states it is Benicar HCT.

## 2012-08-17 NOTE — Telephone Encounter (Signed)
Fortunato Curling then take the losartan HCTZ.

## 2012-08-17 NOTE — Telephone Encounter (Signed)
Pt informed

## 2012-08-17 NOTE — Telephone Encounter (Signed)
Tried to call pt and line was busy.  

## 2012-08-24 ENCOUNTER — Telehealth: Payer: Self-pay | Admitting: Family Medicine

## 2012-08-24 NOTE — Telephone Encounter (Signed)
C/O stress and b/p going up due to stress Encouraged to take B/P meds and can come in to day for a nurse visit if she rechecks and it is high again.

## 2012-08-27 ENCOUNTER — Other Ambulatory Visit: Payer: Self-pay | Admitting: Family Medicine

## 2012-09-10 ENCOUNTER — Encounter: Payer: Self-pay | Admitting: Family Medicine

## 2012-09-10 ENCOUNTER — Ambulatory Visit (INDEPENDENT_AMBULATORY_CARE_PROVIDER_SITE_OTHER): Payer: Medicare Other | Admitting: Family Medicine

## 2012-09-10 VITALS — BP 145/87 | HR 96 | Wt 185.0 lb

## 2012-09-10 DIAGNOSIS — R296 Repeated falls: Secondary | ICD-10-CM

## 2012-09-10 DIAGNOSIS — Z9181 History of falling: Secondary | ICD-10-CM

## 2012-09-10 DIAGNOSIS — I10 Essential (primary) hypertension: Secondary | ICD-10-CM

## 2012-09-10 DIAGNOSIS — N951 Menopausal and female climacteric states: Secondary | ICD-10-CM

## 2012-09-10 DIAGNOSIS — Z780 Asymptomatic menopausal state: Secondary | ICD-10-CM

## 2012-09-10 DIAGNOSIS — Z23 Encounter for immunization: Secondary | ICD-10-CM

## 2012-09-10 DIAGNOSIS — G4733 Obstructive sleep apnea (adult) (pediatric): Secondary | ICD-10-CM

## 2012-09-10 MED ORDER — AMLODIPINE BESYLATE 5 MG PO TABS: 5.0000 mg | ORAL_TABLET | Freq: Every day | ORAL | Status: DC

## 2012-09-10 NOTE — Patient Instructions (Addendum)
Please schedule your eye exam.  We will add amlodipine to your losartan HCT

## 2012-09-10 NOTE — Progress Notes (Signed)
  Subjective:    Patient ID: Christine Washington, female    DOB: 1936/08/04, 76 y.o.   MRN: [id number]  HPI She has fallen 3 times in teh last 3 months. Will start therapy on Friday ( Dr. Kellie Simmering, rheum) ordered this for her.  One of the time she actually tripped. Says doesn't feel light heahed or dizzy when this happens.  She denies her knees giving out or weakness. She says it just seems to happen. No loss of consciousness. 2 of the episodes were out in public. One was at home in her backyard. She was afraid she wouldn't get up by herself.  HTN- No CP or SOB.  We recently changed her Benicar HCTZ to losartan HCTZ. I'm not sure but at some point she quit taking the amlodipine. I am unclear if we stopped it or she just ran out. I could not find a note in the old office visits stating if she has side effects or problems she says she doesn't remember.  Sleep apnea-she finally got her sleephaving difficulty wearing it. She says when she first turns it on there is forceful air that comes out that makes it very difficult for her to tolerate it. She says there is a button she can hit to turn it down but only after the machine is started. They also discussed wearing a mask some without the pressure turned on to she can get a little used to it. She feels very claustrophobic with the mask on. They even tried nasal pillars.   Review of Systems     Objective:   Physical Exam  Constitutional: She is oriented to person, place, and time. She appears well-developed and well-nourished.  HENT:  Head: Normocephalic and atraumatic.  Cardiovascular: Normal rate, regular rhythm and normal heart sounds.   Pulmonary/Chest: Effort normal and breath sounds normal.  Neurological: She is alert and oriented to person, place, and time.  Skin: Skin is warm and dry.  Psychiatric: She has a normal mood and affect. Her behavior is normal.          Assessment & Plan:  HTN- uncontrolled-I. would like to restart the  amlodipine. I'm not exactly sure why she came off of it a couple months ago. I was unable to find any documentation of side effects and the previous office visit notes so we will restart this in addition to continuing her losartan HCTZ. Followup in 3-4 weeks for repeat blood pressure check with me.  Frequent falls-unclear etiology. She denies feeling dizzy or lightheaded. Her blood pressure has not been low does hypotensive events are less likely. She denies her knees giving out. I think starting with therapy for balance would be fantastic. If after therapy she still falling frequently then please let me know and consider further workup.  Sleep apnea-we discussed trying to wear the mask was she's watching TV in the evenings to get used to it. Also consider turning machine on before putting it on but make sure that she has a good tight seal. If she continues to have problems wearing the mask I recommend referral to a sleep medicine specialist. I explained her that it is very important to treat her sleep apnea. This also helped control her blood pressure.  DM- reminded to get her eye exam.  F/U in 2 months.    She is also due for bone density test. We will schedule this for her.

## 2012-09-14 ENCOUNTER — Telehealth: Payer: Self-pay | Admitting: *Deleted

## 2012-09-14 NOTE — Telephone Encounter (Signed)
Error

## 2012-09-18 ENCOUNTER — Ambulatory Visit (INDEPENDENT_AMBULATORY_CARE_PROVIDER_SITE_OTHER): Payer: Medicare Other

## 2012-09-18 ENCOUNTER — Other Ambulatory Visit: Payer: Self-pay | Admitting: Family Medicine

## 2012-09-18 DIAGNOSIS — N959 Unspecified menopausal and perimenopausal disorder: Secondary | ICD-10-CM

## 2012-09-18 DIAGNOSIS — M899 Disorder of bone, unspecified: Secondary | ICD-10-CM

## 2012-09-18 DIAGNOSIS — Z780 Asymptomatic menopausal state: Secondary | ICD-10-CM

## 2012-09-27 ENCOUNTER — Encounter: Payer: Self-pay | Admitting: Family Medicine

## 2012-09-27 DIAGNOSIS — M8580 Other specified disorders of bone density and structure, unspecified site: Secondary | ICD-10-CM | POA: Insufficient documentation

## 2012-09-28 ENCOUNTER — Other Ambulatory Visit: Payer: Self-pay | Admitting: Family Medicine

## 2012-09-28 MED ORDER — ALENDRONATE SODIUM 70 MG PO TABS: 70.0000 mg | ORAL_TABLET | ORAL | Status: DC

## 2012-10-01 ENCOUNTER — Ambulatory Visit (INDEPENDENT_AMBULATORY_CARE_PROVIDER_SITE_OTHER): Payer: Medicare Other | Admitting: Family Medicine

## 2012-10-01 ENCOUNTER — Encounter: Payer: Self-pay | Admitting: Family Medicine

## 2012-10-01 VITALS — BP 138/68 | HR 100 | Ht 66.0 in | Wt 185.0 lb

## 2012-10-01 DIAGNOSIS — G4733 Obstructive sleep apnea (adult) (pediatric): Secondary | ICD-10-CM

## 2012-10-01 DIAGNOSIS — Z9181 History of falling: Secondary | ICD-10-CM

## 2012-10-01 DIAGNOSIS — R296 Repeated falls: Secondary | ICD-10-CM

## 2012-10-01 DIAGNOSIS — I10 Essential (primary) hypertension: Secondary | ICD-10-CM

## 2012-10-01 NOTE — Progress Notes (Signed)
  Subjective:    Patient ID: Christine Washington, female    DOB: 07/12/1936, 76 y.o.   MRN: [id number]  HPI HTN - doing well. REstarted amlodipine. Taking off her medications regularly. No chest pain or soreness of breath.  OSA - She got her CPAP machine and is doing better with it. She was really struggling with the pressure.  Her daughter set the machine to low so that it slowly cramps up to the correct pressure and she says this is actually working much better.  Frequent falls - Was seen by PT and dx her with inner ear vertigo.l   Review of Systems     Objective:   Physical Exam  Constitutional: She is oriented to person, place, and time. She appears well-developed and well-nourished.  HENT:  Head: Normocephalic and atraumatic.  Cardiovascular: Normal rate, regular rhythm and normal heart sounds.   Pulmonary/Chest: Effort normal and breath sounds normal.  Neurological: She is alert and oriented to person, place, and time.  Skin: Skin is warm and dry.  Psychiatric: She has a normal mood and affect. Her behavior is normal.          Assessment & Plan:  HTN- at goal today. F/U in 4 months. Doing well.   OSA - She is dong better with wearing hre machine. Says getting aoubt 4 hours on it.  This is a great start.   Frequent falls-she's doing well with PT. Hopefully this will make a big difference for her. She might also want to consider using a cane to help with his ability.  Due for diabetic f/u in 5 weeks.

## 2012-10-02 ENCOUNTER — Encounter: Payer: Self-pay | Admitting: Internal Medicine

## 2012-10-04 ENCOUNTER — Telehealth: Payer: Self-pay | Admitting: *Deleted

## 2012-10-04 NOTE — Telephone Encounter (Signed)
Pt notiied

## 2012-10-04 NOTE — Telephone Encounter (Signed)
Pt calls andstates that she is trying to adjust to wearing the C Pap and not sleeping well. Called her doc where she had sleep study done and they suggested to her to call you maybe meds need to be changed or adjusted as hers are not working for her right now

## 2012-10-04 NOTE — Telephone Encounter (Signed)
We could try increasing her seroquel to 2 tabs. Does she want to try it for a couple of nights and see if helps?

## 2012-10-08 ENCOUNTER — Other Ambulatory Visit: Payer: Self-pay | Admitting: Family Medicine

## 2012-10-08 ENCOUNTER — Telehealth: Payer: Self-pay

## 2012-10-08 NOTE — Telephone Encounter (Signed)
She will go back down to her original dose of seroquel and also take an xanax.

## 2012-10-08 NOTE — Telephone Encounter (Signed)
Ok to use her xanax if that works better.

## 2012-10-08 NOTE — Telephone Encounter (Signed)
Seroquel is not helping her fall asleep. She wants to know if she can take her xanax instead?

## 2012-10-09 ENCOUNTER — Telehealth: Payer: Self-pay | Admitting: *Deleted

## 2012-10-09 NOTE — Telephone Encounter (Signed)
Pt calls and states she is sorry to keep bothering you but still didn't sleep and states that when does not where the Cpap she sleeps and knows its because of this device and doesn't know whaat to do

## 2012-10-09 NOTE — Telephone Encounter (Signed)
Has she already trid taking 2 of hte xanax at bedtime?  If so then I would like to send her to a s leep specialist to see if they have some recommendations. She is not bothering me at all and I want to do whatever I can to help her be able to wear her CPAP.

## 2012-10-10 NOTE — Telephone Encounter (Signed)
Pt.notified

## 2012-11-09 ENCOUNTER — Encounter: Payer: Self-pay | Admitting: Family Medicine

## 2012-11-09 ENCOUNTER — Ambulatory Visit (INDEPENDENT_AMBULATORY_CARE_PROVIDER_SITE_OTHER): Payer: 59 | Admitting: Family Medicine

## 2012-11-09 VITALS — BP 146/76 | HR 66 | Ht 66.0 in | Wt 186.0 lb

## 2012-11-09 DIAGNOSIS — G4700 Insomnia, unspecified: Secondary | ICD-10-CM

## 2012-11-09 DIAGNOSIS — E119 Type 2 diabetes mellitus without complications: Secondary | ICD-10-CM

## 2012-11-09 DIAGNOSIS — G4721 Circadian rhythm sleep disorder, delayed sleep phase type: Secondary | ICD-10-CM | POA: Insufficient documentation

## 2012-11-09 LAB — COMPLETE METABOLIC PANEL WITH GFR
Albumin: 4.9 g/dL (ref 3.5–5.2)
Alkaline Phosphatase: 65 U/L (ref 39–117)
BUN: 14 mg/dL (ref 6–23)
Creat: 0.88 mg/dL (ref 0.50–1.10)
GFR, Est Non African American: 65 mL/min
Glucose, Bld: 123 mg/dL — ABNORMAL HIGH (ref 70–99)
Total Bilirubin: 0.9 mg/dL (ref 0.3–1.2)
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
## 2012-11-13 ENCOUNTER — Other Ambulatory Visit: Payer: Self-pay | Admitting: Family Medicine

## 2012-11-13 NOTE — Progress Notes (Signed)
Quick Note:  All labs are normal. ______ 

## 2012-11-16 ENCOUNTER — Other Ambulatory Visit: Payer: Self-pay | Admitting: Family Medicine

## 2012-11-28 ENCOUNTER — Other Ambulatory Visit: Payer: Self-pay | Admitting: Family Medicine

## 2012-12-13 ENCOUNTER — Other Ambulatory Visit: Payer: Self-pay | Admitting: Family Medicine

## 2012-12-17 ENCOUNTER — Encounter: Payer: Self-pay | Admitting: Family Medicine

## 2012-12-17 ENCOUNTER — Ambulatory Visit (INDEPENDENT_AMBULATORY_CARE_PROVIDER_SITE_OTHER): Payer: 59 | Admitting: Family Medicine

## 2012-12-17 VITALS — BP 118/73 | HR 87 | Resp 17 | Wt 185.0 lb

## 2012-12-17 DIAGNOSIS — I10 Essential (primary) hypertension: Secondary | ICD-10-CM

## 2012-12-17 NOTE — Progress Notes (Signed)
  Subjective:    Patient ID: Christine Washington, female    DOB: 1936-12-03, 76 y.o.   MRN: [id number]  HPI HTN-  Pt denies chest pain, SOB, dizziness, or heart palpitations.  Taking meds as directed w/o problems.  Denies medication side effects.      Review of Systems     Objective:   Physical Exam  Constitutional: She is oriented to person, place, and time. She appears well-developed and well-nourished.  HENT:  Head: Normocephalic and atraumatic.  Cardiovascular: Normal rate, regular rhythm and normal heart sounds.        2/6SEM  Pulmonary/Chest: Effort normal and breath sounds normal.  Neurological: She is alert and oriented to person, place, and time.  Skin: Skin is warm and dry.  Psychiatric: She has a normal mood and affect. Her behavior is normal.          Assessment & Plan:  HTN - she was here to followup from an elevated blood pressure a month ago. It is back down to normal. We did discuss making sure that she's eating a low salt diet and getting regular exercise. She does not currently have an exercise routine. She does need to work on some weight loss. Followup in 2 months for her diabetic check.

## 2013-01-02 ENCOUNTER — Other Ambulatory Visit: Payer: Self-pay | Admitting: Family Medicine

## 2013-01-11 ENCOUNTER — Other Ambulatory Visit: Payer: Self-pay | Admitting: Family Medicine

## 2013-02-10 ENCOUNTER — Other Ambulatory Visit: Payer: Self-pay | Admitting: Family Medicine

## 2013-02-11 ENCOUNTER — Encounter: Payer: Self-pay | Admitting: Family Medicine

## 2013-02-11 ENCOUNTER — Ambulatory Visit (INDEPENDENT_AMBULATORY_CARE_PROVIDER_SITE_OTHER): Payer: 59 | Admitting: Family Medicine

## 2013-02-11 VITALS — BP 145/79 | HR 84 | Ht 66.0 in | Wt 190.0 lb

## 2013-02-11 DIAGNOSIS — E1149 Type 2 diabetes mellitus with other diabetic neurological complication: Secondary | ICD-10-CM

## 2013-02-11 DIAGNOSIS — E1142 Type 2 diabetes mellitus with diabetic polyneuropathy: Secondary | ICD-10-CM

## 2013-02-11 DIAGNOSIS — I10 Essential (primary) hypertension: Secondary | ICD-10-CM

## 2013-02-11 DIAGNOSIS — E1140 Type 2 diabetes mellitus with diabetic neuropathy, unspecified: Secondary | ICD-10-CM

## 2013-02-11 DIAGNOSIS — E119 Type 2 diabetes mellitus without complications: Secondary | ICD-10-CM

## 2013-02-11 LAB — POCT UA - MICROALBUMIN: Microalbumin Ur, POC: 80 mg/dL

## 2013-02-11 LAB — POCT GLYCOSYLATED HEMOGLOBIN (HGB A1C): Hemoglobin A1C: 6.2

## 2013-02-11 NOTE — Progress Notes (Signed)
  Subjective:    Patient ID: Christine Washington, female    DOB: 05/20/1936, 77 y.o.   MRN: [id number]  HPI HTN-  Pt denies chest pain, SOB, dizziness, or heart palpitations.  Taking meds as directed w/o problems.  Denies medication side effects.    DM- Her weight is up 5 lbs.  She is not exercising.  No hypoglycemic events.  No cuts ore sore or wounds.  Taking medications regularly without any side effects.  Has been stressed recently. Grandchild is currently in a court custody case.  Review of Systems     Objective:   Physical Exam  Constitutional: She is oriented to person, place, and time. She appears well-developed and well-nourished.  HENT:  Head: Normocephalic and atraumatic.  Cardiovascular: Normal rate, regular rhythm and normal heart sounds.   Pulmonary/Chest: Effort normal and breath sounds normal.  Musculoskeletal: She exhibits no edema.  Neurological: She is alert and oriented to person, place, and time.  Skin: Skin is warm and dry.  Psychiatric: She has a normal mood and affect. Her behavior is normal.          Assessment & Plan:  HTN- Slightly elevated today.  Work on exercise and diet. Encouraged her to check into Silver Sneakers. BP was well controlled 2 months ago.  F/U in 3 month to recheck.   DM- well controlled,.A1c is up slightly from November. Continue to work on diet and weight loss.  On statin and ARB and ASA.   DM neuropathy - folllowed by Podiatry. Has callouses treated and is working on getting diabetic shoewear.

## 2013-02-11 NOTE — Patient Instructions (Addendum)
Work on low salt diet and exercise.  1.5 Gram Low Sodium Diet A 1.5 gram sodium diet restricts the amount of sodium in the diet to no more than 1.5 g or 1500 mg daily. The American Heart Association recommends Americans over the age of 83 to consume no more than 1500 mg of sodium each day to reduce the risk of developing high blood pressure. Research also shows that limiting sodium may reduce heart attack and stroke risk. Many foods contain sodium for flavor and sometimes as a preservative. When the amount of sodium in a diet needs to be low, it is important to know what to look for when choosing foods and drinks. The following includes some information and guidelines to help make it easier for you to adapt to a low sodium diet. QUICK TIPS  Do not add salt to food.  Avoid convenience items and fast food.  Choose unsalted snack foods.  Buy lower sodium products, often labeled as "lower sodium" or "no salt added."  Check food labels to learn how much sodium is in 1 serving.  When eating at a restaurant, ask that your food be prepared with less salt or none, if possible. READING FOOD LABELS FOR SODIUM INFORMATION The nutrition facts label is a good place to find how much sodium is in foods. Look for products with no more than 400 mg of sodium per serving. Remember that 1.5 g = 1500 mg. The food label may also list foods as:  Sodium-free: Less than 5 mg in a serving.  Very low sodium: 35 mg or less in a serving.  Low-sodium: 140 mg or less in a serving.  Light in sodium: 50% less sodium in a serving. For example, if a food that usually has 300 mg of sodium is changed to become light in sodium, it will have 150 mg of sodium.  Reduced sodium: 25% less sodium in a serving. For example, if a food that usually has 400 mg of sodium is changed to reduced sodium, it will have 300 mg of sodium. CHOOSING FOODS Grains  Avoid: Salted crackers and snack items. Some cereals, including instant hot  cereals. Bread stuffing and biscuit mixes. Seasoned rice or pasta mixes.  Choose: Unsalted snack items. Low-sodium cereals, oats, puffed wheat and rice, shredded wheat. English muffins and bread. Pasta. Meats  Avoid: Salted, canned, smoked, spiced, pickled meats, including fish and poultry. Bacon, ham, sausage, cold cuts, hot dogs, anchovies.  Choose: Low-sodium canned tuna and salmon. Fresh or frozen meat, poultry, and fish. Dairy  Avoid: Processed cheese and spreads. Cottage cheese. Buttermilk and condensed milk. Regular cheese.  Choose: Milk. Low-sodium cottage cheese. Yogurt. Sour cream. Low-sodium cheese. Fruits and Vegetables  Avoid: Regular canned vegetables. Regular canned tomato sauce and paste. Frozen vegetables in sauces. Olives. Rosita Fire. Relishes. Sauerkraut.  Choose: Low-sodium canned vegetables. Low-sodium tomato sauce and paste. Frozen or fresh vegetables. Fresh and frozen fruit. Condiments  Avoid: Canned and packaged gravies. Worcestershire sauce. Tartar sauce. Barbecue sauce. Soy sauce. Steak sauce. Ketchup. Onion, garlic, and table salt. Meat flavorings and tenderizers.  Choose: Fresh and dried herbs and spices. Low-sodium varieties of mustard and ketchup. Lemon juice. Tabasco sauce. Horseradish. SAMPLE 1.5 GRAM SODIUM MEAL PLAN Breakfast / Sodium (mg)  1 cup low-fat milk / 143 mg  1 whole-wheat English muffin / 240 mg  1 tbs heart-healthy margarine / 153 mg  1 hard-boiled egg / 139 mg  1 small orange / 0 mg Lunch / Sodium (mg)  1  cup raw carrots / 76 mg  2 tbs no salt added peanut butter / 5 mg  2 slices whole-wheat bread / 270 mg  1 tbs jelly / 6 mg   cup red grapes / 2 mg Dinner / Sodium (mg)  1 cup whole-wheat pasta / 2 mg  1 cup low-sodium tomato sauce / 73 mg  3 oz lean ground beef / 57 mg  1 small side salad (1 cup raw spinach leaves,  cup cucumber,  cup yellow bell pepper) with 1 tsp olive oil and 1 tsp red wine vinegar / 25 mg Snack  / Sodium (mg)  1 container low-fat vanilla yogurt / 107 mg  3 graham cracker squares / 127 mg Nutrient Analysis  Calories: 1745  Protein: 75 g  Carbohydrate: 237 g  Fat: 57 g  Sodium: 1425 mg Document Released: 12/05/2005 Document Revised: 02/27/2012 Document Reviewed: 03/08/2010 [facility] Patient Information 2013 [place], [place].

## 2013-02-12 ENCOUNTER — Other Ambulatory Visit: Payer: Self-pay | Admitting: Family Medicine

## 2013-03-07 ENCOUNTER — Telehealth: Payer: Self-pay | Admitting: *Deleted

## 2013-03-07 ENCOUNTER — Encounter: Payer: Self-pay | Admitting: Family Medicine

## 2013-03-07 ENCOUNTER — Ambulatory Visit (INDEPENDENT_AMBULATORY_CARE_PROVIDER_SITE_OTHER): Payer: 59 | Admitting: Family Medicine

## 2013-03-07 VITALS — BP 156/83 | HR 109 | Temp 98.0°F | Wt 191.0 lb

## 2013-03-07 DIAGNOSIS — R05 Cough: Secondary | ICD-10-CM

## 2013-03-07 DIAGNOSIS — R0602 Shortness of breath: Secondary | ICD-10-CM

## 2013-03-07 DIAGNOSIS — R059 Cough, unspecified: Secondary | ICD-10-CM

## 2013-03-07 DIAGNOSIS — J45909 Unspecified asthma, uncomplicated: Secondary | ICD-10-CM

## 2013-03-07 MED ORDER — SODIUM CHLORIDE 0.9 % IV SOLN: 40.0000 mg | Freq: Once | INTRAVENOUS | Status: DC

## 2013-03-07 MED ORDER — ALBUTEROL SULFATE HFA 108 (90 BASE) MCG/ACT IN AERS: 2.0000 | INHALATION_SPRAY | Freq: Four times a day (QID) | RESPIRATORY_TRACT | Status: DC | PRN

## 2013-03-07 MED ORDER — METHYLPREDNISOLONE SODIUM SUCC 40 MG IJ SOLR
40.0000 mg | Freq: Once | INTRAMUSCULAR | Status: AC
  Administered 2013-03-07: 40 mg via INTRAMUSCULAR

## 2013-03-07 MED ORDER — ALBUTEROL SULFATE (2.5 MG/3ML) 0.083% IN NEBU
2.5000 mg | INHALATION_SOLUTION | Freq: Once | RESPIRATORY_TRACT | Status: AC
  Administered 2013-03-07: 2.5 mg via RESPIRATORY_TRACT

## 2013-03-07 MED ORDER — DIPHENHYDRAMINE HCL 25 MG PO CAPS
25.0000 mg | ORAL_CAPSULE | Freq: Once | ORAL | Status: AC
  Administered 2013-03-07: 25 mg via ORAL

## 2013-03-07 NOTE — Progress Notes (Signed)
  Subjective:    Patient ID: Christine Washington, female    DOB: 02-07-1936, 77 y.o.   MRN: [id number]  HPI Started with cough yesterday. Says bought some new fragrant laundry detergent. Says has had a HA and felt SOB and coughing since then. No fever.  No rash.  Usually uses fragrence free products.  Has been wheezing.  Couldn't use her CPAP last night.  No prior history of asthma. She says the only time that this has happened was when she was taking a blood pressure medication. It was eventually stopped and she got better. She is somewhat sensitive to fragrances and occasionally will experience shortness of breath it has been a while. She does report getting a little bit of cold congestion of her nose and her throat. She says that last night she got so short of breath EMS called EMS twice. She is still short of breath today but it's a little bit better. No worsening or alleviating symptoms.   Review of Systems     Objective:   Physical Exam  Constitutional: She is oriented to person, place, and time. She appears well-developed and well-nourished.  HENT:  Head: Normocephalic and atraumatic.  Right Ear: External ear normal.  Left Ear: External ear normal.  Nose: Nose normal.  Mouth/Throat: Oropharynx is clear and moist.  TMs and canals are clear.   Eyes: Conjunctivae and EOM are normal. Pupils are equal, round, and reactive to light.  Neck: Neck supple. No thyromegaly present.  Cardiovascular: Normal rate, regular rhythm and normal heart sounds.   Pulmonary/Chest: Effort normal. She has wheezes.  Expiratory wheezing at the right and left upper lung fields. I can hear expiratory wheezing with anterior chest exam as well. No crackles or rhonchi. She has a very dry cough in the room.  Lymphadenopathy:    She has no cervical adenopathy.  Neurological: She is alert and oriented to person, place, and time.  Skin: Skin is warm and dry.  Psychiatric: She has a normal mood and affect.           Assessment & Plan:  SOB secondary to perfume exposure. I think she is potentially having an allergic reaction. She was given 2 nebulizer treatments in the office and after the second treatment her lungs cleared. We also gave her 25 mg of oral Benadryl as well as 40 mg of Solu-Medrol. Hopefully the next week 4 hours she will improve. I did go ahead and send her for prescriptions she the pharmacy for an albuterol inhaler to use as needed. Recommend that she get rid of the perfumed laundry detergent and get back to her fragrance free products. If her symptoms persists then consider further evaluation with chest x-ray et Karie Soda.

## 2013-03-07 NOTE — Patient Instructions (Signed)
Please call if not getting better for the next week 4 hours. He can use the albuterol inhaler every 4-6 hours as needed, 2-4 puffs.

## 2013-03-07 NOTE — Telephone Encounter (Signed)
Pt seen today in the office & dx with allergic bronchitis.  She calls & states that her head is "killing" her.  She has taken ibuprofen & rubbed a pain reliever on the back of her neck with no relief.  She is still coughing really bad (I can hear her over the phone).  You told her to call back tomorrow if she isn't feeling any better but she is still feeling really bad.  Please advise.

## 2013-03-07 NOTE — Telephone Encounter (Signed)
I didn't see this message until 10pm.  Please call pt this AM and see if feeling better.

## 2013-03-08 MED ORDER — HYDROCODONE-HOMATROPINE 5-1.5 MG/5ML PO SYRP: 5.0000 mL | ORAL_SOLUTION | Freq: Every evening | ORAL | Status: DC | PRN

## 2013-03-08 NOTE — Telephone Encounter (Signed)
We can fax over a cough medicine to her pharmacy.

## 2013-03-08 NOTE — Telephone Encounter (Signed)
Called pt.  She states she is still coughing some & still a little short winded but feels a lot better from yesterday.

## 2013-03-08 NOTE — Telephone Encounter (Signed)
The pharmacy want to switch her inhaler from Proventil to ProAir. Her insurance will not pay for Proventil. Is this ok?    Patient advised.

## 2013-03-08 NOTE — Telephone Encounter (Signed)
OK to switch 

## 2013-03-11 ENCOUNTER — Other Ambulatory Visit: Payer: Self-pay | Admitting: Family Medicine

## 2013-03-27 ENCOUNTER — Other Ambulatory Visit: Payer: Self-pay | Admitting: Physician Assistant

## 2013-03-27 MED ORDER — DULOXETINE HCL 60 MG PO CPEP: 60.0000 mg | ORAL_CAPSULE | Freq: Every day | ORAL | Status: DC

## 2013-04-07 ENCOUNTER — Other Ambulatory Visit: Payer: Self-pay | Admitting: Family Medicine

## 2013-04-10 ENCOUNTER — Other Ambulatory Visit: Payer: Self-pay | Admitting: Family Medicine

## 2013-04-10 NOTE — Telephone Encounter (Signed)
Will need a f/u appt in May

## 2013-04-23 ENCOUNTER — Ambulatory Visit (INDEPENDENT_AMBULATORY_CARE_PROVIDER_SITE_OTHER): Payer: 59 | Admitting: Family Medicine

## 2013-04-23 ENCOUNTER — Encounter: Payer: Self-pay | Admitting: Family Medicine

## 2013-04-23 VITALS — BP 138/75 | HR 84 | Temp 98.2°F | Wt 188.0 lb

## 2013-04-23 DIAGNOSIS — K219 Gastro-esophageal reflux disease without esophagitis: Secondary | ICD-10-CM

## 2013-04-23 DIAGNOSIS — R110 Nausea: Secondary | ICD-10-CM

## 2013-04-23 MED ORDER — OMEPRAZOLE 40 MG PO CPDR: 40.0000 mg | DELAYED_RELEASE_CAPSULE | Freq: Every day | ORAL | Status: DC

## 2013-04-23 MED ORDER — PROMETHAZINE HCL 25 MG PO TABS: 25.0000 mg | ORAL_TABLET | Freq: Four times a day (QID) | ORAL | Status: DC | PRN

## 2013-04-23 NOTE — Patient Instructions (Addendum)
B.R.A.T. Diet Your doctor has recommended the B.R.A.T. diet for you or your child until the condition improves. This is often used to help control diarrhea and vomiting symptoms. If you or your child can tolerate clear liquids, you may have:  Bananas.   Rice.   Applesauce.   Toast (and other simple starches such as crackers, potatoes, noodles).  Be sure to avoid dairy products, meats, and fatty foods until symptoms are better. Fruit juices such as apple, grape, and prune juice can make diarrhea worse. Avoid these. Continue this diet for 2 days or as instructed by your caregiver. Document Released: 12/05/2005 Document Revised: 11/24/2011 Document Reviewed: 05/24/2007 [employer] Patient Information 2012 [employer]. 

## 2013-04-23 NOTE — Progress Notes (Signed)
CC: Christine Washington is a 77 y.o. female is here for Emesis   Subjective: HPI:  Patient complains of abrupt vomiting and diarrhea that occurred Tuesday last week and last for 6 hours described as moderate in severity. This resolved without intervention but she remains with mild nausea and fatigue. Both are worse when not eating, sour stomach sensation has caused decreased appetite. She denies any abdominal discomfort otherwise. She denies fevers, chills, diarrhea, constipation, back pain, chest pain, nor skin or scleral icterus since 48 hours after episode. She tells me symptoms are significantly improved when drinking Sprite. Blood sugars average around 120 since illness   Review Of Systems Outlined In HPI  Past Medical History  Diagnosis Date  . Hypertension   . Diabetes mellitus 6-10  . Hyperlipidemia   . OA (osteoarthritis) of knee     left- Dr Yisroel Ramming  . Depression   . GERD (gastroesophageal reflux disease)     Dr Juanda Chance  . Hiatal hernia   . Mitral valve regurgitation   . Diverticulosis   . Asthma   . Anxiety   . Cervical disc disease   . Anemia     pernicious  . OSA (obstructive sleep apnea)     non compliant w/ CPAP  . Diabetic neuropathy   . IBS (irritable bowel syndrome)      Family History  Problem Relation Age of Onset  . Hypertension Mother   . Stroke Mother   . Diabetes Mother   . Stroke Father 56  . Hypertension Father   . Cancer Father     melanoma  . Multiple sclerosis Sister     died  . COPD Sister   . Colon cancer Neg Hx      History  Substance Use Topics  . Smoking status: Never Smoker   . Smokeless tobacco: Never Used  . Alcohol Use: No     Objective: Filed Vitals:   04/23/13 1543  BP: 138/75  Pulse: 84  Temp: 98.2 F (36.8 C)    General: Alert and Oriented, No Acute Distress HEENT: Pupils equal, round, reactive to light. Conjunctivae clear.  Moist mucous membranes Lungs: Clear to auscultation bilaterally, no  wheezing/ronchi/rales.  Comfortable work of breathing. Good air movement. Cardiac: Regular rate and rhythm. Normal S1/S2.  No murmurs, rubs, nor gallops.   Abdomen: Normal bowel sounds, soft and non tender without palpable masses. No CVA tenderness Extremities: No peripheral edema.  Strong peripheral pulses.  Mental Status: No depression, anxiety, nor agitation. Skin: Warm and dry.  Assessment & Plan: Christine Washington was seen today for emesis.  Diagnoses and associated orders for this visit:  Nausea alone - Discontinue: promethazine (PHENERGAN) 25 MG tablet; Take 1 tablet (25 mg total) by mouth every 6 (six) hours as needed for nausea. - promethazine (PHENERGAN) 25 MG tablet; Take 1 tablet (25 mg total) by mouth every 6 (six) hours as needed for nausea.  GERD (gastroesophageal reflux disease) - Discontinue: omeprazole (PRILOSEC) 40 MG capsule; Take 1 capsule (40 mg total) by mouth daily. - omeprazole (PRILOSEC) 40 MG capsule; Take 1 capsule (40 mg total) by mouth daily.    Discussed with patient most likely viral gastroenteritis with slow recovery due to poor oral intake. We'll try to help appetite/sour stomach with increasing Prilosec and as needed promethazine.Signs and symptoms requring emergent/urgent reevaluation were discussed with the patient. Focus on BRAT diet and return at end of the week if not improving.  Return if symptoms worsen or fail to improve.

## 2013-04-29 ENCOUNTER — Other Ambulatory Visit: Payer: Self-pay | Admitting: Physician Assistant

## 2013-05-02 ENCOUNTER — Other Ambulatory Visit: Payer: Self-pay | Admitting: Physician Assistant

## 2013-05-02 ENCOUNTER — Other Ambulatory Visit: Payer: Self-pay | Admitting: Family Medicine

## 2013-05-14 ENCOUNTER — Encounter: Payer: Self-pay | Admitting: Family Medicine

## 2013-05-14 ENCOUNTER — Other Ambulatory Visit: Payer: Self-pay | Admitting: Family Medicine

## 2013-05-14 ENCOUNTER — Ambulatory Visit (INDEPENDENT_AMBULATORY_CARE_PROVIDER_SITE_OTHER): Payer: 59 | Admitting: Family Medicine

## 2013-05-14 VITALS — BP 136/77 | HR 94 | Wt 189.0 lb

## 2013-05-14 DIAGNOSIS — F3289 Other specified depressive episodes: Secondary | ICD-10-CM

## 2013-05-14 DIAGNOSIS — E1142 Type 2 diabetes mellitus with diabetic polyneuropathy: Secondary | ICD-10-CM

## 2013-05-14 DIAGNOSIS — F329 Major depressive disorder, single episode, unspecified: Secondary | ICD-10-CM

## 2013-05-14 DIAGNOSIS — E785 Hyperlipidemia, unspecified: Secondary | ICD-10-CM

## 2013-05-14 DIAGNOSIS — I10 Essential (primary) hypertension: Secondary | ICD-10-CM

## 2013-05-14 DIAGNOSIS — F32A Depression, unspecified: Secondary | ICD-10-CM

## 2013-05-14 DIAGNOSIS — E119 Type 2 diabetes mellitus without complications: Secondary | ICD-10-CM

## 2013-05-14 DIAGNOSIS — E1140 Type 2 diabetes mellitus with diabetic neuropathy, unspecified: Secondary | ICD-10-CM

## 2013-05-14 DIAGNOSIS — E1149 Type 2 diabetes mellitus with other diabetic neurological complication: Secondary | ICD-10-CM

## 2013-05-14 LAB — LIPID PANEL
LDL Cholesterol: 81 mg/dL (ref 0–99)
Triglycerides: 139 mg/dL (ref <150)
VLDL: 28 mg/dL (ref 0–40)

## 2013-05-14 NOTE — Progress Notes (Signed)
  Subjective:    Patient ID: Christine Washington, female    DOB: 03/17/1936, 77 y.o.   MRN: [id number]  HPI DM- no hypoglycemic evetns.  Says she has been eating things she shouldn't.  No wounds taht aren't healing well.  No ankle swelling.    HTN -  Pt denies chest pain, SOB, dizziness, or heart palpitations.  Taking meds as directed w/o problems.  Denies medication side effects.   Depression-she says overall she actually feels good. She feels that her moods been in a good place most days. She's looking forward to the summer and plans to go to the beach for about 2 weeks which he is excited about. She's tolerating her medication well without any side effects.  Hyperlipidemia-tolerating statin well side effects or problems. Review of Systems     Objective:   Physical Exam  Constitutional: She is oriented to person, place, and time. She appears well-developed and well-nourished.  HENT:  Head: Normocephalic and atraumatic.  Neck: Neck supple. No thyromegaly present.  Cardiovascular: Normal rate, regular rhythm and normal heart sounds.   Pulmonary/Chest: Effort normal and breath sounds normal.  Lymphadenopathy:    She has no cervical adenopathy.  Neurological: She is alert and oriented to person, place, and time.  Skin: Skin is warm and dry.  Psychiatric: She has a normal mood and affect. Her behavior is normal.          Assessment & Plan:  DM- Well controlled. A1C is 6.4 today. On metfrormin. On statin and ARB, and ASA.  Followup in 4 months.  HTN -Well controlled. Continue current regimen. Followup in 4 months.  Depression-fairly well-controlled on Cymbalta. Continue current regimen.  Hyperlipidemia-due to recheck lipid levels. Lab slip given today.

## 2013-05-15 LAB — COMPLETE METABOLIC PANEL WITH GFR
ALT: 15 U/L (ref 0–35)
AST: 25 U/L (ref 0–37)
Calcium: 9.7 mg/dL (ref 8.4–10.5)
Chloride: 100 mEq/L (ref 96–112)
Creat: 1.04 mg/dL (ref 0.50–1.10)

## 2013-05-15 NOTE — Progress Notes (Signed)
Quick Note:  All labs are normal. ______ 

## 2013-06-25 ENCOUNTER — Other Ambulatory Visit: Payer: Self-pay | Admitting: Family Medicine

## 2013-06-25 ENCOUNTER — Other Ambulatory Visit: Payer: Self-pay | Admitting: *Deleted

## 2013-06-25 ENCOUNTER — Other Ambulatory Visit: Payer: Self-pay | Admitting: Physician Assistant

## 2013-06-28 ENCOUNTER — Other Ambulatory Visit: Payer: Self-pay | Admitting: Physician Assistant

## 2013-07-09 ENCOUNTER — Other Ambulatory Visit: Payer: Self-pay | Admitting: Family Medicine

## 2013-08-08 ENCOUNTER — Other Ambulatory Visit: Payer: Self-pay | Admitting: Family Medicine

## 2013-08-14 ENCOUNTER — Encounter: Payer: Self-pay | Admitting: Family Medicine

## 2013-08-14 ENCOUNTER — Ambulatory Visit (INDEPENDENT_AMBULATORY_CARE_PROVIDER_SITE_OTHER): Payer: 59 | Admitting: Family Medicine

## 2013-08-14 VITALS — BP 132/74 | HR 88 | Wt 188.0 lb

## 2013-08-14 DIAGNOSIS — Z23 Encounter for immunization: Secondary | ICD-10-CM

## 2013-08-14 DIAGNOSIS — R5381 Other malaise: Secondary | ICD-10-CM

## 2013-08-14 DIAGNOSIS — R141 Gas pain: Secondary | ICD-10-CM

## 2013-08-14 DIAGNOSIS — E119 Type 2 diabetes mellitus without complications: Secondary | ICD-10-CM

## 2013-08-14 DIAGNOSIS — R140 Abdominal distension (gaseous): Secondary | ICD-10-CM

## 2013-08-14 DIAGNOSIS — M7989 Other specified soft tissue disorders: Secondary | ICD-10-CM

## 2013-08-14 LAB — COMPLETE METABOLIC PANEL WITH GFR
ALT: 15 U/L (ref 0–35)
Alkaline Phosphatase: 51 U/L (ref 39–117)
CO2: 29 mEq/L (ref 19–32)
Sodium: 140 mEq/L (ref 135–145)
Total Bilirubin: 0.9 mg/dL (ref 0.3–1.2)
Total Protein: 7.2 g/dL (ref 6.0–8.3)

## 2013-08-14 LAB — CBC WITH DIFFERENTIAL/PLATELET
Basophils Absolute: 0 10*3/uL (ref 0.0–0.1)
Eosinophils Relative: 3 % (ref 0–5)
Lymphocytes Relative: 26 % (ref 12–46)
Neutro Abs: 4.3 10*3/uL (ref 1.7–7.7)
Platelets: 221 10*3/uL (ref 150–400)
RDW: 14.5 % (ref 11.5–15.5)
WBC: 6.9 10*3/uL (ref 4.0–10.5)

## 2013-08-14 LAB — POCT GLYCOSYLATED HEMOGLOBIN (HGB A1C): Hemoglobin A1C: 6.2

## 2013-08-14 NOTE — Progress Notes (Signed)
  Subjective:    Patient ID: Christine Washington, female    DOB: Apr 17, 1936, 77 y.o.   MRN: [id number]  HPI DM- no hypoglycemic events.  No wounds that are not healing well/ she is taking her medication without any side effects or problems.  Bloating x 2 months  Bowels have been more slow which is unsual for her.  Has not tried anything OTC. Normally 2-3 BM per day.  Now will skip a day at a time. Says has a sorenessin the epigatrum. No blood in stool. Drinks plenty of water.  Takes her prilosec daily. She's as long as she takes her Prilosec she doesn't usually experience any heartburn symptoms.   Has had leg swellingin both legs x 2 months . Worse on the left.  Says fell several months ago.  They go down at night. Some swelling in hands as well. No rash.  No CP or SOB.  Hx of Bilat knee replacment. Swelling is up to the thigh.      Chemistry      Component Value Date/Time   NA 140 05/14/2013 1127   K 3.8 05/14/2013 1127   CL 100 05/14/2013 1127   CO2 31 05/14/2013 1127   BUN 16 05/14/2013 1127   CREATININE 1.04 05/14/2013 1127   CREATININE 0.96 04/12/2010 1852      Component Value Date/Time   CALCIUM 9.7 05/14/2013 1127   ALKPHOS 54 05/14/2013 1127   AST 25 05/14/2013 1127   ALT 15 05/14/2013 1127   BILITOT 0.7 05/14/2013 1127      Review of Systems     Objective:   Physical Exam  Constitutional: She is oriented to person, place, and time. She appears well-developed and well-nourished.  HENT:  Head: Normocephalic and atraumatic.  Cardiovascular: Normal rate, regular rhythm and normal heart sounds.   Pulmonary/Chest: Effort normal and breath sounds normal.  Neurological: She is alert and oriented to person, place, and time.  Skin: Skin is warm and dry.  Psychiatric: She has a normal mood and affect. Her behavior is normal.          Assessment & Plan:  DM- well controlled. A1C is 6.2 continue current regimen. Followup in 3-4 months.  Bloating - Will start miralax and see if  feeling much better. May be secondary to constipation. She is not improving then consider further workup for epigastric discomfort and bloating. She or he takes a PPI daily so I did not make adjustment. She has not had any reflux or heartburn symptoms. She's as long as she takes her Prilosec she doesn't usually experience any heartburn symptoms. No red flag symptoms at this time.  Leg swelling x 2 mo- Will check labs. Unclear etiology. It's been going on about this time time frame as the bloating. Certainly they could be related to when she moves her bowels it would be adjusting to see if the swelling improves as well. In the meantime we'll check for electrolyte abnormalities, renal insufficiency, and anemia.

## 2013-08-14 NOTE — Patient Instructions (Signed)
Can try miralax for your bowels.

## 2013-08-15 ENCOUNTER — Other Ambulatory Visit: Payer: Self-pay | Admitting: Family Medicine

## 2013-08-15 LAB — URINALYSIS, MICROSCOPIC ONLY: Crystals: NONE SEEN

## 2013-08-15 LAB — URINALYSIS, ROUTINE W REFLEX MICROSCOPIC
Nitrite: POSITIVE — AB
Specific Gravity, Urine: 1.02 (ref 1.005–1.030)
pH: 5.5 (ref 5.0–8.0)

## 2013-08-15 LAB — MICROALBUMIN / CREATININE URINE RATIO: Microalb Creat Ratio: 9.1 mg/g (ref 0.0–30.0)

## 2013-08-15 LAB — TSH: TSH: 1.516 u[IU]/mL (ref 0.350–4.500)

## 2013-08-15 MED ORDER — CIPROFLOXACIN HCL 500 MG PO TABS: 500.0000 mg | ORAL_TABLET | Freq: Two times a day (BID) | ORAL | Status: AC

## 2013-08-30 ENCOUNTER — Ambulatory Visit: Payer: 59 | Admitting: Family Medicine

## 2013-08-31 ENCOUNTER — Other Ambulatory Visit: Payer: Self-pay | Admitting: Family Medicine

## 2013-09-05 ENCOUNTER — Encounter: Payer: Self-pay | Admitting: Family Medicine

## 2013-09-05 DIAGNOSIS — H269 Unspecified cataract: Secondary | ICD-10-CM | POA: Insufficient documentation

## 2013-09-07 ENCOUNTER — Other Ambulatory Visit: Payer: Self-pay | Admitting: Family Medicine

## 2013-09-12 ENCOUNTER — Other Ambulatory Visit: Payer: Self-pay | Admitting: Family Medicine

## 2013-09-12 ENCOUNTER — Encounter: Payer: Self-pay | Admitting: Cardiology

## 2013-09-13 ENCOUNTER — Other Ambulatory Visit: Payer: Self-pay | Admitting: Family Medicine

## 2013-09-17 ENCOUNTER — Encounter: Payer: Self-pay | Admitting: Family Medicine

## 2013-09-20 ENCOUNTER — Other Ambulatory Visit: Payer: Self-pay | Admitting: Family Medicine

## 2013-10-02 ENCOUNTER — Other Ambulatory Visit: Payer: Self-pay | Admitting: Family Medicine

## 2013-10-08 ENCOUNTER — Telehealth: Payer: Self-pay | Admitting: *Deleted

## 2013-10-08 MED ORDER — DIPHENOXYLATE-ATROPINE 2.5-0.025 MG PO TABS: 1.0000 | ORAL_TABLET | Freq: Three times a day (TID) | ORAL | Status: DC | PRN

## 2013-10-08 NOTE — Telephone Encounter (Signed)
Pt notified of instructions.  She wants to know if you can send her some more lomotil.  She was only given 10.

## 2013-10-08 NOTE — Telephone Encounter (Signed)
Okay to try to restart medications tomorrow. I would hold the metformin until at least  Thursday or Friday though

## 2013-10-08 NOTE — Telephone Encounter (Signed)
Pt notified rx faxed.

## 2013-10-08 NOTE — Telephone Encounter (Signed)
Pt states that this past weekend she had terrible diarrhea & went to the ED Sunday.  They gave her lomotil qid & metronidazole 500mg  bid.  She states that she is still having the diarrhea but it has slowed down some.  She is worried about her regular medications because she was unable to keep anything in her body & she just hasn't been taking them. She wants know when she should be able to start taking them again.  She states that she is still on the liquid diet but is able to have a cracker or banana today. Please advise

## 2013-10-08 NOTE — Telephone Encounter (Signed)
Will send over new rx. Will need ot be faxed.

## 2013-10-10 ENCOUNTER — Other Ambulatory Visit: Payer: Self-pay | Admitting: Family Medicine

## 2013-10-17 ENCOUNTER — Other Ambulatory Visit: Payer: Self-pay | Admitting: Family Medicine

## 2013-10-24 ENCOUNTER — Other Ambulatory Visit: Payer: Self-pay | Admitting: Family Medicine

## 2013-10-29 ENCOUNTER — Ambulatory Visit: Payer: 59 | Admitting: Family Medicine

## 2013-11-01 ENCOUNTER — Ambulatory Visit (INDEPENDENT_AMBULATORY_CARE_PROVIDER_SITE_OTHER): Payer: 59 | Admitting: Physician Assistant

## 2013-11-01 VITALS — BP 128/70 | HR 90 | Wt 176.0 lb

## 2013-11-01 DIAGNOSIS — R5383 Other fatigue: Secondary | ICD-10-CM

## 2013-11-01 DIAGNOSIS — R531 Weakness: Secondary | ICD-10-CM

## 2013-11-01 DIAGNOSIS — E860 Dehydration: Secondary | ICD-10-CM

## 2013-11-01 DIAGNOSIS — K30 Functional dyspepsia: Secondary | ICD-10-CM

## 2013-11-01 DIAGNOSIS — K3189 Other diseases of stomach and duodenum: Secondary | ICD-10-CM

## 2013-11-01 DIAGNOSIS — IMO0001 Reserved for inherently not codable concepts without codable children: Secondary | ICD-10-CM

## 2013-11-01 DIAGNOSIS — R5381 Other malaise: Secondary | ICD-10-CM

## 2013-11-01 DIAGNOSIS — R61 Generalized hyperhidrosis: Secondary | ICD-10-CM

## 2013-11-01 LAB — CBC WITH DIFFERENTIAL/PLATELET
Eosinophils Relative: 2 % (ref 0–5)
HCT: 38.9 % (ref 36.0–46.0)
Lymphocytes Relative: 20 % (ref 12–46)
Lymphs Abs: 1.5 10*3/uL (ref 0.7–4.0)
MCV: 80.9 fL (ref 78.0–100.0)
Monocytes Absolute: 0.7 10*3/uL (ref 0.1–1.0)
Monocytes Relative: 9 % (ref 3–12)
Neutro Abs: 5.4 10*3/uL (ref 1.7–7.7)
RDW: 14.5 % (ref 11.5–15.5)
WBC: 7.8 10*3/uL (ref 4.0–10.5)

## 2013-11-01 LAB — COMPREHENSIVE METABOLIC PANEL
BUN: 14 mg/dL (ref 6–23)
CO2: 26 mEq/L (ref 19–32)
Calcium: 10.4 mg/dL (ref 8.4–10.5)
Chloride: 101 mEq/L (ref 96–112)
Creat: 0.98 mg/dL (ref 0.50–1.10)
Glucose, Bld: 132 mg/dL — ABNORMAL HIGH (ref 70–99)

## 2013-11-01 LAB — POCT URINALYSIS DIPSTICK
Glucose, UA: NEGATIVE
Nitrite, UA: NEGATIVE
Urobilinogen, UA: 0.2

## 2013-11-01 MED ORDER — SUCRALFATE 1 G PO TABS: 1.0000 g | ORAL_TABLET | Freq: Three times a day (TID) | ORAL | Status: DC

## 2013-11-01 NOTE — Patient Instructions (Signed)
Diet for Diarrhea, Adult Frequent, runny stools (diarrhea) may be caused or worsened by food or drink. Diarrhea may be relieved by changing your diet. Since diarrhea can last up to 7 days, it is easy for you to lose too much fluid from the body and become dehydrated. Fluids that are lost need to be replaced. Along with a modified diet, make sure you drink enough fluids to keep your urine clear or pale yellow. DIET INSTRUCTIONS  Ensure adequate fluid intake (hydration): have 1 cup (8 oz) of fluid for each diarrhea episode. Avoid fluids that contain simple sugars or sports drinks, fruit juices, whole milk products, and sodas. Your urine should be clear or pale yellow if you are drinking enough fluids. Hydrate with an oral rehydration solution that you can purchase at pharmacies, retail stores, and online. You can prepare an oral rehydration solution at home by mixing the following ingredients together:    tsp table salt.   tsp baking soda.   tsp salt substitute containing potassium chloride.  1  tablespoons sugar.  1 L (34 oz) of water.  Certain foods and beverages may increase the speed at which food moves through the gastrointestinal (GI) tract. These foods and beverages should be avoided and include:  Caffeinated and alcoholic beverages.  High-fiber foods, such as raw fruits and vegetables, nuts, seeds, and whole grain breads and cereals.  Foods and beverages sweetened with sugar alcohols, such as xylitol, sorbitol, and mannitol.  Some foods may be well tolerated and may help thicken stool including:  Starchy foods, such as rice, toast, pasta, low-sugar cereal, oatmeal, grits, baked potatoes, crackers, and bagels.   Bananas.   Applesauce.  Add probiotic-rich foods to help increase healthy bacteria in the GI tract, such as yogurt and fermented milk products. RECOMMENDED FOODS AND BEVERAGES Starches Choose foods with less than 2 g of fiber per serving.  Recommended:  White,  [place], and pita breads, plain rolls, buns, bagels. Plain muffins, matzo. Soda, saltine, or graham crackers. Pretzels, melba toast, zwieback. Cooked cereals made with water: cornmeal, farina, cream cereals. Dry cereals: refined corn, wheat, rice. Potatoes prepared any way without skins, refined macaroni, spaghetti, noodles, refined rice.  Avoid:  Bread, rolls, or crackers made with whole wheat, multi-grains, rye, bran seeds, nuts, or coconut. Corn tortillas or taco shells. Cereals containing whole grains, multi-grains, bran, coconut, nuts, raisins. Cooked or dry oatmeal. Coarse wheat cereals, granola. Cereals advertised as "high-fiber." Potato skins. Whole grain pasta, wild or brown rice. Popcorn. Sweet potatoes, yams. Sweet rolls, doughnuts, waffles, pancakes, sweet breads. Vegetables  Recommended: Strained tomato and vegetable juices. Most well-cooked and canned vegetables without seeds. Fresh: Tender lettuce, cucumber without the skin, cabbage, spinach, bean sprouts.  Avoid: Fresh, cooked, or canned: Artichokes, baked beans, beet greens, broccoli, Brussels sprouts, corn, kale, legumes, peas, sweet potatoes. Cooked: Green or red cabbage, spinach. Avoid large servings of any vegetables because vegetables shrink when cooked, and they contain more fiber per serving than fresh vegetables. Fruit  Recommended: Cooked or canned: Apricots, applesauce, cantaloupe, cherries, fruit cocktail, grapefruit, grapes, kiwi, mandarin oranges, peaches, pears, plums, watermelon. Fresh: Apples without skin, ripe banana, grapes, cantaloupe, cherries, grapefruit, peaches, oranges, plums. Keep servings limited to  cup or 1 piece.  Avoid: Fresh: Apples with skin, apricots, mangoes, pears, raspberries, strawberries. Prune juice, stewed or dried prunes. Dried fruits, raisins, dates. Large servings of all fresh fruits. Protein  Recommended: Ground or well-cooked tender beef, ham, veal, lamb, pork, or poultry. Eggs. Fish,  oysters, shrimp,  lobster, other seafoods. Liver, organ meats.  Avoid: Tough, fibrous meats with gristle. Peanut butter, smooth or chunky. Cheese, nuts, seeds, legumes, dried peas, beans, lentils. Dairy  Recommended: Yogurt, lactose-free milk, kefir, drinkable yogurt, buttermilk, soy milk, or plain hard cheese.  Avoid: Milk, chocolate milk, beverages made with milk, such as milkshakes. Soups  Recommended: Bouillon, broth, or soups made from allowed foods. Any strained soup.  Avoid: Soups made from vegetables that are not allowed, cream or milk-based soups. Desserts and Sweets  Recommended: Sugar-free gelatin, sugar-free frozen ice pops made without sugar alcohol.  Avoid: Plain cakes and cookies, pie made with fruit, pudding, custard, cream pie. Gelatin, fruit, ice, sherbet, frozen ice pops. Ice cream, ice milk without nuts. Plain hard candy, honey, jelly, molasses, syrup, sugar, chocolate syrup, gumdrops, marshmallows. Fats and Oils  Recommended: Limit fats to less than 8 tsp per day.  Avoid: Seeds, nuts, olives, avocados. Margarine, butter, cream, mayonnaise, salad oils, plain salad dressings. Plain gravy, crisp bacon without rind. Beverages  Recommended: Water, decaffeinated teas, oral rehydration solutions, sugar-free beverages not sweetened with sugar alcohols.  Avoid: Fruit juices, caffeinated beverages (coffee, tea, soda), alcohol, sports drinks, or lemon-lime soda. Condiments  Recommended: Ketchup, mustard, horseradish, vinegar, cocoa powder. Spices in moderation: allspice, basil, bay leaves, celery powder or leaves, cinnamon, cumin powder, curry powder, ginger, mace, marjoram, onion or garlic powder, oregano, paprika, parsley flakes, ground pepper, rosemary, sage, savory, tarragon, thyme, turmeric.  Avoid: Coconut, honey. Document Released: 02/25/2004 Document Revised: 08/29/2012 Document Reviewed: 04/20/2012 [facility] Patient Information 2014 [place],  [place].   Dehydration, Elderly Dehydration is when you lose more fluids from the body than you take in. Vital organs such as the kidneys, brain, and heart cannot function without a proper amount of fluids and salt. Any loss of fluids from the body can cause dehydration.  Older adults are at a higher risk of dehydration than younger adults. As we age, our bodies are less able to conserve water and do not respond to temperature changes as well. Also, older adults do not become thirsty as easily or quickly. Because of this, older adults often do not realize they need to increase fluids to avoid dehydration.  CAUSES   Vomiting.  Diarrhea.  Excessive sweating.  Excessive urine output.  Fever. SYMPTOMS  Mild dehydration  Thirst.  Dry lips.  Slightly dry mouth. Moderate dehydration  Very dry mouth.  Sunken eyes.  Skin does not bounce back quickly when lightly pinched and released.  Dark urine and decreased urine production.  Decreased tear production.  Headache. Severe dehydration  Very dry mouth.  Extreme thirst.  Rapid, weak pulse (more than 100 beats per minute at rest).  Cold hands and feet.  Not able to sweat in spite of heat.  Rapid breathing.  Blue lips.  Confusion and lethargy.  Difficulty being awakened.  Minimal urine production.  No tears. DIAGNOSIS  Your caregiver will diagnose dehydration based on your symptoms and your exam. Blood and urine tests will help confirm the diagnosis. The diagnostic evaluation should also identify the cause of dehydration. TREATMENT  Treatment of mild or moderate dehydration can often be done at home by increasing the amount of fluids that you drink. It is best to drink small amounts of fluid more often. Drinking too much at one time can make vomiting worse.  Severe dehydration needs to be treated at the hospital where you will probably be given intravenous (IV) fluids that contain water and electrolytes. HOME CARE  INSTRUCTIONS  Ask your caregiver about specific rehydration instructions.  Drink enough fluids to keep your urine clear or pale yellow.  Drink small amounts frequently if you have nausea and vomiting.  Eat as you normally do.  Avoid:  Foods or drinks high in sugar.  Carbonated drinks.  Juice.  Extremely hot or cold fluids.  Drinks with caffeine.  Fatty, greasy foods.  Alcohol.  Tobacco.  Overeating.  Gelatin desserts.  Wash your hands well to avoid spreading bacteria and viruses.  Only take over-the-counter or prescription medicines for pain, discomfort, or fever as directed by your caregiver.  Ask your caregiver if you should continue all prescribed and over-the-counter medicines.  Keep all follow-up appointments with your caregiver. SEEK MEDICAL CARE IF:  You have abdominal pain and it increases or stays in one area (localizes).  You have a rash, stiff neck, or severe headache.  You are irritable, sleepy, or difficult to awaken.  You are weak, dizzy, or extremely thirsty. SEEK IMMEDIATE MEDICAL CARE IF:   You are unable to keep fluids down, or you get worse despite treatment.  You have frequent episodes of vomiting or diarrhea.  You have blood or green matter (bile) in your vomit.  You have blood in your stool or your stool looks black and tarry.  You have not urinated in 6 to 8 hours, or you have only urinated a small amount of very dark urine.  You have a fever.  You faint. MAKE SURE YOU:   Understand these instructions.  Will watch your condition.  Will get help right away if you are not doing well or get worse. Document Released: 02/25/2004 Document Revised: 02/27/2012 Document Reviewed: 07/25/2011 [facility] Patient Information 2014 [place], [place].

## 2013-11-01 NOTE — Progress Notes (Signed)
  Subjective:    Patient ID: Christine Washington, female    DOB: 14-Dec-1936, 77 y.o.   MRN: [id number]  HPI Pt is a 77 yo WF who presents to the clinic with chief complaint of feeling weak. Pt has previously had 2 weeks of diarrhea until 2 days ago when it stopped with lomotil that Dr. Linford Arnold called in. She has continued to feel weaker and weaker. She has had some congestion in the am but goes away during the day. She has an occasional cough. Denies any fever. She does feel like her stomach is burning sometimes. She gets nauseated just drinking water. She has not vomited. She denies any dysuria or urinary changes in frequency.She is on prilosec. She did go to ER when she first developed diarrhea and CT was done of abdomen and was negative. She was given fluids and sent home. She was sent home with carafate and she did feel like that helped some.      Review of Systems     Objective:   Physical Exam  Constitutional: She is oriented to person, place, and time. She appears well-developed and well-nourished.  She did seem to be sweating under her eyes and around her nose.   HENT:  Head: Normocephalic and atraumatic.  Moist mucus membranes in mouth.   Eyes: Conjunctivae are normal.  Neck: Normal range of motion. Neck supple.  Cardiovascular: Normal rate, regular rhythm and normal heart sounds.   Pulmonary/Chest: Effort normal and breath sounds normal.  Abdominal: Soft. Bowel sounds are normal. There is no tenderness. There is no rebound and no guarding.  Lymphadenopathy:    She has no cervical adenopathy.  Neurological: She is alert and oriented to person, place, and time.  Skin: Skin is warm. She is diaphoretic.  Psychiatric: She has a normal mood and affect. Her behavior is normal.          Assessment & Plan:  Dehydration/weakness/diarrhea/burning sensation in stomach- UA positive for protein, leuks(trace). I will culture. I do not want to treat with abx with definitive dx of UTI it  could cause diarrhea again and then worsen dehydration. I do think pt is getting over a nasty GI bug that has left her dehydrated. Encouraged pt to push fluids at home this weekend. Gatorade and water. Will check electrolytes today. Since pt is having some burning sensation in her stomach. Will send carafate to use with prilosec. Will check for h.pylori. Pt declined anti-nausea medication at this time. Pt is not tachycardic and BP stable today. Call if worsening.

## 2013-11-02 ENCOUNTER — Other Ambulatory Visit: Payer: Self-pay | Admitting: Family Medicine

## 2013-11-03 LAB — URINE CULTURE: Organism ID, Bacteria: 25000

## 2013-11-04 LAB — H. PYLORI ANTIBODY, IGG: H Pylori IgG: <0.4 {ISR}

## 2013-11-09 ENCOUNTER — Other Ambulatory Visit: Payer: Self-pay | Admitting: Family Medicine

## 2013-11-12 ENCOUNTER — Ambulatory Visit (INDEPENDENT_AMBULATORY_CARE_PROVIDER_SITE_OTHER): Payer: Medicare Other

## 2013-11-12 VITALS — BP 150/77 | HR 75 | Resp 20 | Ht 66.0 in | Wt 176.0 lb

## 2013-11-12 DIAGNOSIS — B351 Tinea unguium: Secondary | ICD-10-CM

## 2013-11-12 DIAGNOSIS — M2040 Other hammer toe(s) (acquired), unspecified foot: Secondary | ICD-10-CM

## 2013-11-12 DIAGNOSIS — M79609 Pain in unspecified limb: Secondary | ICD-10-CM

## 2013-11-12 DIAGNOSIS — E1142 Type 2 diabetes mellitus with diabetic polyneuropathy: Secondary | ICD-10-CM

## 2013-11-12 DIAGNOSIS — Q828 Other specified congenital malformations of skin: Secondary | ICD-10-CM

## 2013-11-12 DIAGNOSIS — E1140 Type 2 diabetes mellitus with diabetic neuropathy, unspecified: Secondary | ICD-10-CM

## 2013-11-12 DIAGNOSIS — E1149 Type 2 diabetes mellitus with other diabetic neurological complication: Secondary | ICD-10-CM

## 2013-11-12 NOTE — Progress Notes (Signed)
  Subjective:    Patient ID: Christine Washington, female    DOB: 05/28/1936, 77 y.o.   MRN: [id number] "Cut my toenails and trim these callouses."  HPI    Review of Systems deferred this visit     Objective:   Physical Exam Neurovascular status is intact as follows DP pulse +2/4 PT pulse plus one over 4 bilateral thready. Refill time 3 seconds all digits skin temperature warm turgor normal no edema pallor or varicosities noted. Neurologically epicritic and proprioceptive sensations intact and symmetric bilateral there is decreased epicritic sensation Semmes Weinstein testing to the digits and plantar forefoot. Dermatologically skin color pigment normal hair growth absent. Nails thick criptotic incurvated friable and brittle one through 5 bilateral. There is also multiple keratoses secondary digital contractures and plantar flexed metatarsals sub-second and fourth right sub-third and fifth left. Patient is notable bunion and tailor bunion deformities as well with semirigid digital contractures of digits 2 through 5. No other new changes no new medications or findings noted no open wounds or ulcerations noted.       Assessment & Plan:  Assessment diabetes with complicating factors noted. Mycotic nails 1 through 5 bilateral debridement at this time the presence of diabetes also multiple keratoses sub-2 and 4 right sub-3 and 5 left are debrided recommended lotion to the feet daily maintain a thick padding shoe at all times inappropriate cotton or Kerlix socks. Followup for palliative nail care on an as-needed basis suggest a 3 month followup  Alvan Dame DPM

## 2013-11-12 NOTE — Patient Instructions (Signed)
Diabetes and Foot Care Diabetes may cause you to have problems because of poor blood supply (circulation) to your feet and legs. This may cause the skin on your feet to become thinner, break easier, and heal more slowly. Your skin may become dry, and the skin may peel and crack. You may also have nerve damage in your legs and feet causing decreased feeling in them. You may not notice minor injuries to your feet that could lead to infections or more serious problems. Taking care of your feet is one of the most important things you can do for yourself.  HOME CARE INSTRUCTIONS  Wear shoes at all times, even in the house. Do not go barefoot. Bare feet are easily injured.  Check your feet daily for blisters, cuts, and redness. If you cannot see the bottom of your feet, use a mirror or ask someone for help.  Wash your feet with warm water (do not use hot water) and mild soap. Then pat your feet and the areas between your toes until they are completely dry. Do not soak your feet as this can dry your skin.  Apply a moisturizing lotion or petroleum jelly (that does not contain alcohol and is unscented) to the skin on your feet and to dry, brittle toenails. Do not apply lotion between your toes.  Trim your toenails straight across. Do not dig under them or around the cuticle. File the edges of your nails with an emery board or nail file.  Do not cut corns or calluses or try to remove them with medicine.  Wear clean socks or stockings every day. Make sure they are not too tight. Do not wear knee-high stockings since they may decrease blood flow to your legs.  Wear shoes that fit properly and have enough cushioning. To break in new shoes, wear them for just a few hours a day. This prevents you from injuring your feet. Always look in your shoes before you put them on to be sure there are no objects inside.  Do not cross your legs. This may decrease the blood flow to your feet.  If you find a minor scrape,  cut, or break in the skin on your feet, keep it and the skin around it clean and dry. These areas may be cleansed with mild soap and water. Do not cleanse the area with peroxide, alcohol, or iodine.  When you remove an adhesive bandage, be sure not to damage the skin around it.  If you have a wound, look at it several times a day to make sure it is healing.  Do not use heating pads or hot water bottles. They may burn your skin. If you have lost feeling in your feet or legs, you may not know it is happening until it is too late.  Make sure your health care provider performs a complete foot exam at least annually or more often if you have foot problems. Report any cuts, sores, or bruises to your health care provider immediately. SEEK MEDICAL CARE IF:   You have an injury that is not healing.  You have cuts or breaks in the skin.  You have an ingrown nail.  You notice redness on your legs or feet.  You feel burning or tingling in your legs or feet.  You have pain or cramps in your legs and feet.  Your legs or feet are numb.  Your feet always feel cold. SEEK IMMEDIATE MEDICAL CARE IF:   There is increasing redness,   swelling, or pain in or around a wound.  There is a red line that goes up your leg.  Pus is coming from a wound.  You develop a fever or as directed by your health care provider.  You notice a bad smell coming from an ulcer or wound. Document Released: 12/02/2000 Document Revised: 08/07/2013 Document Reviewed: 05/14/2013 [employer] Patient Information 2014 [employer].  

## 2013-11-20 ENCOUNTER — Other Ambulatory Visit: Payer: Self-pay | Admitting: Family Medicine

## 2013-11-22 ENCOUNTER — Ambulatory Visit (INDEPENDENT_AMBULATORY_CARE_PROVIDER_SITE_OTHER): Payer: 59 | Admitting: Family Medicine

## 2013-11-22 ENCOUNTER — Encounter: Payer: Self-pay | Admitting: Family Medicine

## 2013-11-22 VITALS — BP 128/71 | HR 102 | Temp 97.9°F | Ht 66.0 in | Wt 176.0 lb

## 2013-11-22 DIAGNOSIS — M8580 Other specified disorders of bone density and structure, unspecified site: Secondary | ICD-10-CM

## 2013-11-22 DIAGNOSIS — F32A Depression, unspecified: Secondary | ICD-10-CM

## 2013-11-22 DIAGNOSIS — F329 Major depressive disorder, single episode, unspecified: Secondary | ICD-10-CM

## 2013-11-22 DIAGNOSIS — Z1231 Encounter for screening mammogram for malignant neoplasm of breast: Secondary | ICD-10-CM

## 2013-11-22 DIAGNOSIS — Z1331 Encounter for screening for depression: Secondary | ICD-10-CM

## 2013-11-22 DIAGNOSIS — M899 Disorder of bone, unspecified: Secondary | ICD-10-CM

## 2013-11-22 DIAGNOSIS — F3289 Other specified depressive episodes: Secondary | ICD-10-CM

## 2013-11-22 DIAGNOSIS — I10 Essential (primary) hypertension: Secondary | ICD-10-CM

## 2013-11-22 MED ORDER — DULOXETINE HCL 20 MG PO CPEP: ORAL_CAPSULE | ORAL | Status: DC

## 2013-11-22 NOTE — Progress Notes (Signed)
   Subjective:    Patient ID: Christine Washington, female    DOB: 11/29/1936, 77 y.o.   MRN: [id number]  HPI She says her stomach is much better.  She was unable to take carafate pills. Felt like they would get stuck in her throat that his quit taking them but overall she is much better.  Depression.  Got letter from insurance stating her cymbalta is going up a tier.  They recommended alternatives. She is very happy with her regimen and really does not want to change it.  HTN -  Pt denies chest pain, SOB, dizziness, or heart palpitations.  Taking meds as directed w/o problems.  Denies medication side effects.    She recently fractured her left wrist. She is seeing an orthopedist for this. Evidently the wrist was also dislocated. She does have osteopenia and taking medication for this.  Review of Systems     Objective:   Physical Exam  Constitutional: She is oriented to person, place, and time. She appears well-developed and well-nourished.  HENT:  Head: Normocephalic and atraumatic.  Cardiovascular: Normal rate, regular rhythm and normal heart sounds.   Pulmonary/Chest: Effort normal and breath sounds normal.  Neurological: She is alert and oriented to person, place, and time.  Skin: Skin is warm and dry.  Psychiatric: She has a normal mood and affect. Her behavior is normal.          Assessment & Plan:  HTN - well controlled.  Continue current regimen. Followup in 6 months.  Depression - Doing well on cymbalta.  She often does not seek change her prescription at this time. She is going to continue it and just with cost difference will be in her co-pay come January. If the cost is too high then consider weaning and switching to citalopram. Otherwise followup in 6 months.  Osteoporosis - Taking her fosamax without any problems or side effects.  Screening mammogram-encouraged her to think about getting one more mammogram at age 52. Different guidelines suggest different things.  Discussed with her that if her life expectancy is more than 10 years and could consider getting regular mammograms.

## 2013-12-09 ENCOUNTER — Other Ambulatory Visit: Payer: Self-pay | Admitting: Family Medicine

## 2013-12-18 ENCOUNTER — Other Ambulatory Visit: Payer: Self-pay | Admitting: Family Medicine

## 2013-12-30 ENCOUNTER — Other Ambulatory Visit: Payer: Self-pay | Admitting: Family Medicine

## 2014-01-04 ENCOUNTER — Other Ambulatory Visit: Payer: Self-pay | Admitting: Family Medicine

## 2014-01-07 ENCOUNTER — Other Ambulatory Visit: Payer: Self-pay | Admitting: *Deleted

## 2014-01-07 MED ORDER — DULOXETINE HCL 60 MG PO CPEP: 60.0000 mg | ORAL_CAPSULE | Freq: Every day | ORAL | Status: DC

## 2014-01-13 ENCOUNTER — Other Ambulatory Visit: Payer: Self-pay | Admitting: Family Medicine

## 2014-01-31 ENCOUNTER — Ambulatory Visit: Payer: Medicare Other

## 2014-02-11 ENCOUNTER — Other Ambulatory Visit: Payer: Self-pay | Admitting: Family Medicine

## 2014-03-18 ENCOUNTER — Ambulatory Visit (INDEPENDENT_AMBULATORY_CARE_PROVIDER_SITE_OTHER): Payer: Medicare Other

## 2014-03-18 VITALS — BP 135/68 | HR 86 | Resp 12

## 2014-03-18 DIAGNOSIS — E1149 Type 2 diabetes mellitus with other diabetic neurological complication: Secondary | ICD-10-CM

## 2014-03-18 DIAGNOSIS — E1140 Type 2 diabetes mellitus with diabetic neuropathy, unspecified: Secondary | ICD-10-CM

## 2014-03-18 DIAGNOSIS — B351 Tinea unguium: Secondary | ICD-10-CM

## 2014-03-18 DIAGNOSIS — Q828 Other specified congenital malformations of skin: Secondary | ICD-10-CM

## 2014-03-18 DIAGNOSIS — M79609 Pain in unspecified limb: Secondary | ICD-10-CM

## 2014-03-18 NOTE — Progress Notes (Signed)
   Subjective:    Patient ID: Christine Washington, female    DOB: 1936-03-24, 78 y.o.   MRN: [id number]  HPI ''TOENAILS AND CALLUS TRIM.''    Review of Systems no new changes or significant findings     Objective:   Physical Exam Lotion injected findings reveals masker status intact pedal pulses palpable DP and PT +2/4 bilateral capillary fill time 3 seconds all digits epicritic and proprioceptive sensations decreased on Semmes Weinstein testing to the toe is keratosis subsecond right between the first and second toes right secondary to hammertoe deformity and digital contracture is also keratoses sub-second left and inferior calcaneal area left as well. Orthopedic biomechanical exam reveals atrophy of the plantar fat pad contractures toes 2 through 4 and 5 with arthrosis as well as HAV deformity and bunion deformity with lateral deviation hallux bilateral pedal and overlapping second digit with associated keratoses to there is no open wounds no secondary infection no ulcers noted current time.       Assessment & Plan:  Assessment diabetes with peripheral neuropathy mycotic nails painful tender symptomatically through 5 bilateral debridement at this time the presence of pain in symptomology as well as diabetes and complications also multiple keratoses are debrided no active ulcers are noted however through the toes and maintaining some dispersion padding patient Walker and socks without shoes by she is to wear socks and shoes at all times no barefoot or flimsy shoes or flip-flops reappointed 3-4 months for continued diabetic foot and palliative nail care mycotic nails debrided x10 also multiple keratoses debrided both feet  Harriet Masson DPM

## 2014-03-18 NOTE — Patient Instructions (Signed)
Diabetes and Foot Care Diabetes may cause you to have problems because of poor blood supply (circulation) to your feet and legs. This may cause the skin on your feet to become thinner, break easier, and heal more slowly. Your skin may become dry, and the skin may peel and crack. You may also have nerve damage in your legs and feet causing decreased feeling in them. You may not notice minor injuries to your feet that could lead to infections or more serious problems. Taking care of your feet is one of the most important things you can do for yourself.  HOME CARE INSTRUCTIONS  Wear shoes at all times, even in the house. Do not go barefoot. Bare feet are easily injured.  Check your feet daily for blisters, cuts, and redness. If you cannot see the bottom of your feet, use a mirror or ask someone for help.  Wash your feet with warm water (do not use hot water) and mild soap. Then pat your feet and the areas between your toes until they are completely dry. Do not soak your feet as this can dry your skin.  Apply a moisturizing lotion or petroleum jelly (that does not contain alcohol and is unscented) to the skin on your feet and to dry, brittle toenails. Do not apply lotion between your toes.  Trim your toenails straight across. Do not dig under them or around the cuticle. File the edges of your nails with an emery board or nail file.  Do not cut corns or calluses or try to remove them with medicine.  Wear clean socks or stockings every day. Make sure they are not too tight. Do not wear knee-high stockings since they may decrease blood flow to your legs.  Wear shoes that fit properly and have enough cushioning. To break in new shoes, wear them for just a few hours a day. This prevents you from injuring your feet. Always look in your shoes before you put them on to be sure there are no objects inside.  Do not cross your legs. This may decrease the blood flow to your feet.  If you find a minor scrape,  cut, or break in the skin on your feet, keep it and the skin around it clean and dry. These areas may be cleansed with mild soap and water. Do not cleanse the area with peroxide, alcohol, or iodine.  When you remove an adhesive bandage, be sure not to damage the skin around it.  If you have a wound, look at it several times a day to make sure it is healing.  Do not use heating pads or hot water bottles. They may burn your skin. If you have lost feeling in your feet or legs, you may not know it is happening until it is too late.  Make sure your health care provider performs a complete foot exam at least annually or more often if you have foot problems. Report any cuts, sores, or bruises to your health care provider immediately. SEEK MEDICAL CARE IF:   You have an injury that is not healing.  You have cuts or breaks in the skin.  You have an ingrown nail.  You notice redness on your legs or feet.  You feel burning or tingling in your legs or feet.  You have pain or cramps in your legs and feet.  Your legs or feet are numb.  Your feet always feel cold. SEEK IMMEDIATE MEDICAL CARE IF:   There is increasing redness,   swelling, or pain in or around a wound.  There is a red line that goes up your leg.  Pus is coming from a wound.  You develop a fever or as directed by your health care provider.  You notice a bad smell coming from an ulcer or wound. Document Released: 12/02/2000 Document Revised: 08/07/2013 Document Reviewed: 05/14/2013 [employer] Patient Information 2014 [employer].  

## 2014-03-20 ENCOUNTER — Other Ambulatory Visit: Payer: Self-pay | Admitting: Family Medicine

## 2014-03-26 ENCOUNTER — Encounter (HOSPITAL_BASED_OUTPATIENT_CLINIC_OR_DEPARTMENT_OTHER): Payer: Self-pay | Admitting: *Deleted

## 2014-03-28 ENCOUNTER — Encounter (HOSPITAL_BASED_OUTPATIENT_CLINIC_OR_DEPARTMENT_OTHER): Payer: Self-pay | Admitting: *Deleted

## 2014-03-28 NOTE — Progress Notes (Signed)
NPO AFTER MN WITH EXCEPTION WATER/ GATORADE UNTIL 0630. ARRIVE AT 1100. NEEDS ISTAT AND EKG.

## 2014-04-03 ENCOUNTER — Other Ambulatory Visit: Payer: Self-pay

## 2014-04-03 ENCOUNTER — Encounter (HOSPITAL_BASED_OUTPATIENT_CLINIC_OR_DEPARTMENT_OTHER): Payer: Self-pay | Admitting: *Deleted

## 2014-04-03 ENCOUNTER — Ambulatory Visit (HOSPITAL_BASED_OUTPATIENT_CLINIC_OR_DEPARTMENT_OTHER)
Admission: RE | Admit: 2014-04-03 | Discharge: 2014-04-03 | Disposition: A | Discharge status: Home / Self Care | Payer: 59 | Source: Ambulatory Visit | Attending: Orthopedic Surgery | Admitting: Orthopedic Surgery

## 2014-04-03 ENCOUNTER — Encounter (HOSPITAL_BASED_OUTPATIENT_CLINIC_OR_DEPARTMENT_OTHER): Payer: 59 | Admitting: Anesthesiology

## 2014-04-03 ENCOUNTER — Ambulatory Visit (HOSPITAL_BASED_OUTPATIENT_CLINIC_OR_DEPARTMENT_OTHER): Payer: 59 | Admitting: Anesthesiology

## 2014-04-03 ENCOUNTER — Encounter (HOSPITAL_BASED_OUTPATIENT_CLINIC_OR_DEPARTMENT_OTHER)
Admission: RE | Disposition: A | Discharge status: Home / Self Care | Payer: Self-pay | Source: Ambulatory Visit | Attending: Orthopedic Surgery

## 2014-04-03 DIAGNOSIS — F329 Major depressive disorder, single episode, unspecified: Secondary | ICD-10-CM | POA: Insufficient documentation

## 2014-04-03 DIAGNOSIS — E1142 Type 2 diabetes mellitus with diabetic polyneuropathy: Secondary | ICD-10-CM | POA: Insufficient documentation

## 2014-04-03 DIAGNOSIS — G5600 Carpal tunnel syndrome, unspecified upper limb: Secondary | ICD-10-CM

## 2014-04-03 DIAGNOSIS — E785 Hyperlipidemia, unspecified: Secondary | ICD-10-CM | POA: Insufficient documentation

## 2014-04-03 DIAGNOSIS — M1710 Unilateral primary osteoarthritis, unspecified knee: Secondary | ICD-10-CM | POA: Insufficient documentation

## 2014-04-03 DIAGNOSIS — E1149 Type 2 diabetes mellitus with other diabetic neurological complication: Secondary | ICD-10-CM | POA: Insufficient documentation

## 2014-04-03 DIAGNOSIS — G4733 Obstructive sleep apnea (adult) (pediatric): Secondary | ICD-10-CM | POA: Insufficient documentation

## 2014-04-03 DIAGNOSIS — K219 Gastro-esophageal reflux disease without esophagitis: Secondary | ICD-10-CM | POA: Insufficient documentation

## 2014-04-03 DIAGNOSIS — M5090 Cervical disc disorder, unspecified, unspecified cervical region: Secondary | ICD-10-CM | POA: Insufficient documentation

## 2014-04-03 DIAGNOSIS — Z79899 Other long term (current) drug therapy: Secondary | ICD-10-CM | POA: Insufficient documentation

## 2014-04-03 DIAGNOSIS — Z888 Allergy status to other drugs, medicaments and biological substances status: Secondary | ICD-10-CM | POA: Insufficient documentation

## 2014-04-03 DIAGNOSIS — F411 Generalized anxiety disorder: Secondary | ICD-10-CM | POA: Insufficient documentation

## 2014-04-03 DIAGNOSIS — F3289 Other specified depressive episodes: Secondary | ICD-10-CM | POA: Insufficient documentation

## 2014-04-03 DIAGNOSIS — I10 Essential (primary) hypertension: Secondary | ICD-10-CM | POA: Insufficient documentation

## 2014-04-03 HISTORY — DX: Obstructive sleep apnea (adult) (pediatric): G47.33

## 2014-04-03 HISTORY — PX: CARPAL TUNNEL RELEASE: SHX101

## 2014-04-03 HISTORY — DX: Type 2 diabetes mellitus without complications: E11.9

## 2014-04-03 HISTORY — DX: Personal history of (healed) traumatic fracture: Z87.81

## 2014-04-03 HISTORY — DX: Presence of spectacles and contact lenses: Z97.3

## 2014-04-03 HISTORY — DX: Dependence on other enabling machines and devices: Z99.89

## 2014-04-03 HISTORY — DX: Personal history of other benign neoplasm: Z86.018

## 2014-04-03 HISTORY — DX: Personal history of other diseases of the digestive system: Z87.19

## 2014-04-03 HISTORY — DX: Carpal tunnel syndrome, left upper limb: G56.02

## 2014-04-03 LAB — POCT I-STAT, CHEM 8
BUN: 13 mg/dL (ref 6–23)
Calcium, Ion: 1.26 mmol/L (ref 1.13–1.30)
Chloride: 100 mEq/L (ref 96–112)
Creatinine, Ser: 0.9 mg/dL (ref 0.50–1.10)
Glucose, Bld: 121 mg/dL — ABNORMAL HIGH (ref 70–99)
HEMATOCRIT: 42 % (ref 36.0–46.0)
Hemoglobin: 14.3 g/dL (ref 12.0–15.0)
Potassium: 3.6 mEq/L — ABNORMAL LOW (ref 3.7–5.3)
Sodium: 142 mEq/L (ref 137–147)
TCO2: 28 mmol/L (ref 0–100)

## 2014-04-03 LAB — GLUCOSE, CAPILLARY: Glucose-Capillary: 102 mg/dL — ABNORMAL HIGH (ref 70–99)

## 2014-04-03 SURGERY — CARPAL TUNNEL RELEASE
Anesthesia: Monitor Anesthesia Care | Site: Hand | Laterality: Left

## 2014-04-03 MED ORDER — CEFAZOLIN SODIUM-DEXTROSE 2-3 GM-% IV SOLR
2.0000 g | Freq: Once | INTRAVENOUS | Status: DC
  Filled 2014-04-03: qty 50

## 2014-04-03 MED ORDER — PROPOFOL 10 MG/ML IV EMUL
INTRAVENOUS | Status: DC | PRN
  Administered 2014-04-03: 25 ug/kg/min via INTRAVENOUS

## 2014-04-03 MED ORDER — HYDROCODONE-ACETAMINOPHEN 5-300 MG PO TABS: 1.0000 | ORAL_TABLET | Freq: Four times a day (QID) | ORAL | Status: DC | PRN

## 2014-04-03 MED ORDER — PROPOFOL 10 MG/ML IV BOLUS
INTRAVENOUS | Status: DC | PRN
  Administered 2014-04-03: 50 mg via INTRAVENOUS

## 2014-04-03 MED ORDER — DOCUSATE SODIUM 100 MG PO CAPS: 100.0000 mg | ORAL_CAPSULE | Freq: Two times a day (BID) | ORAL | Status: DC

## 2014-04-03 MED ORDER — BUPIVACAINE HCL 0.25 % IJ SOLN: INTRAMUSCULAR | Status: DC | PRN

## 2014-04-03 MED ORDER — FENTANYL CITRATE 0.05 MG/ML IJ SOLN
INTRAMUSCULAR | Status: AC
  Filled 2014-04-03: qty 4

## 2014-04-03 MED ORDER — LACTATED RINGERS IV SOLN
INTRAVENOUS | Status: DC
  Filled 2014-04-03: qty 1000

## 2014-04-03 MED ORDER — LACTATED RINGERS IV SOLN
INTRAVENOUS | Status: DC
  Administered 2014-04-03: 12:00:00 via INTRAVENOUS
  Filled 2014-04-03: qty 1000

## 2014-04-03 MED ORDER — PROMETHAZINE HCL 25 MG/ML IJ SOLN
6.2500 mg | INTRAMUSCULAR | Status: DC | PRN
  Filled 2014-04-03: qty 1

## 2014-04-03 MED ORDER — BUPIVACAINE HCL 0.5 % IJ SOLN
INTRAMUSCULAR | Status: DC | PRN
  Administered 2014-04-03: 10 mL

## 2014-04-03 MED ORDER — LIDOCAINE HCL (PF) 1 % IJ SOLN
INTRAMUSCULAR | Status: DC | PRN
  Administered 2014-04-03: 10 mL

## 2014-04-03 MED ORDER — FENTANYL CITRATE 0.05 MG/ML IJ SOLN
INTRAMUSCULAR | Status: DC | PRN
  Administered 2014-04-03 (×2): 50 ug via INTRAVENOUS

## 2014-04-03 MED ORDER — LIDOCAINE HCL (CARDIAC) 20 MG/ML IV SOLN
INTRAVENOUS | Status: DC | PRN
  Administered 2014-04-03: 50 mg via INTRAVENOUS

## 2014-04-03 MED ORDER — HYDROMORPHONE HCL PF 1 MG/ML IJ SOLN
0.2500 mg | INTRAMUSCULAR | Status: DC | PRN
  Filled 2014-04-03: qty 1

## 2014-04-03 MED ORDER — 0.9 % SODIUM CHLORIDE (POUR BTL) OPTIME
TOPICAL | Status: DC | PRN
  Administered 2014-04-03: 500 mL

## 2014-04-03 MED ORDER — ONDANSETRON HCL 4 MG/2ML IJ SOLN
INTRAMUSCULAR | Status: DC | PRN
  Administered 2014-04-03: 4 mg via INTRAVENOUS

## 2014-04-03 SURGICAL SUPPLY — 48 items
BANDAGE ELASTIC 3 VELCRO ST LF (GAUZE/BANDAGES/DRESSINGS) ×2 IMPLANT
BANDAGE ELASTIC 4 VELCRO ST LF (GAUZE/BANDAGES/DRESSINGS) ×1 IMPLANT
BLADE SURG 15 STRL LF DISP TIS (BLADE) ×1 IMPLANT
BLADE SURG 15 STRL SS (BLADE) ×2
BNDG CMPR 9X4 STRL LF SNTH (GAUZE/BANDAGES/DRESSINGS) ×1
BNDG CONFORM 3 STRL LF (GAUZE/BANDAGES/DRESSINGS) ×2 IMPLANT
BNDG ESMARK 4X9 LF (GAUZE/BANDAGES/DRESSINGS) ×2 IMPLANT
CORDS BIPOLAR (ELECTRODE) ×1 IMPLANT
COVER MAYO STAND STRL (DRAPES) ×1 IMPLANT
COVER TABLE BACK 60X90 (DRAPES) ×2 IMPLANT
CUFF TOURNIQUET SINGLE 18IN (TOURNIQUET CUFF) ×1 IMPLANT
DRAPE EXTREMITY T 121X128X90 (DRAPE) ×2 IMPLANT
DRAPE LG THREE QUARTER DISP (DRAPES) ×4 IMPLANT
DRAPE SURG 17X23 STRL (DRAPES) ×2 IMPLANT
DRAPE U-SHAPE 47X51 STRL (DRAPES) IMPLANT
DRSG EMULSION OIL 3X3 NADH (GAUZE/BANDAGES/DRESSINGS) IMPLANT
GAUZE SPONGE 4X4 16PLY XRAY LF (GAUZE/BANDAGES/DRESSINGS) IMPLANT
GAUZE XEROFORM 1X8 LF (GAUZE/BANDAGES/DRESSINGS) ×1 IMPLANT
GLOVE BIO SURGEON STRL SZ8 (GLOVE) ×2 IMPLANT
GLOVE BIOGEL PI IND STRL 8.5 (GLOVE) ×1 IMPLANT
GLOVE BIOGEL PI INDICATOR 8.5 (GLOVE) ×1
GOWN STRL REUS W/ TWL XL LVL3 (GOWN DISPOSABLE) ×1 IMPLANT
GOWN STRL REUS W/TWL XL LVL3 (GOWN DISPOSABLE) ×3 IMPLANT
KNIFE CARPAL TUNNEL (BLADE) ×1 IMPLANT
LOOP VESSEL MAXI BLUE (MISCELLANEOUS) IMPLANT
NDL HYPO 25X1 1.5 SAFETY (NEEDLE) ×2 IMPLANT
NDL SAFETY ECLIPSE 18X1.5 (NEEDLE) ×2 IMPLANT
NEEDLE HYPO 18GX1.5 SHARP (NEEDLE) ×4
NEEDLE HYPO 25X1 1.5 SAFETY (NEEDLE) ×4 IMPLANT
NS IRRIG 500ML POUR BTL (IV SOLUTION) ×2 IMPLANT
PACK BASIN DAY SURGERY FS (CUSTOM PROCEDURE TRAY) ×2 IMPLANT
PAD ALCOHOL SWAB (MISCELLANEOUS) ×8 IMPLANT
PAD CAST 3X4 CTTN HI CHSV (CAST SUPPLIES) ×1 IMPLANT
PAD CAST 4YDX4 CTTN HI CHSV (CAST SUPPLIES) IMPLANT
PADDING CAST ABS 3INX4YD NS (CAST SUPPLIES) ×1
PADDING CAST ABS 4INX4YD NS (CAST SUPPLIES) ×1
PADDING CAST ABS COTTON 3X4 (CAST SUPPLIES) IMPLANT
PADDING CAST ABS COTTON 4X4 ST (CAST SUPPLIES) ×1 IMPLANT
PADDING CAST COTTON 3X4 STRL (CAST SUPPLIES) ×2
PADDING CAST COTTON 4X4 STRL (CAST SUPPLIES) ×2
SPONGE GAUZE 4X4 12PLY STER LF (GAUZE/BANDAGES/DRESSINGS) ×2 IMPLANT
STOCKINETTE 4X48 STRL (DRAPES) ×2 IMPLANT
SUT PROLENE 4 0 PS 2 18 (SUTURE) ×2 IMPLANT
SYR BULB 3OZ (MISCELLANEOUS) ×2 IMPLANT
SYR CONTROL 10ML LL (SYRINGE) ×4 IMPLANT
TOWEL OR 17X24 6PK STRL BLUE (TOWEL DISPOSABLE) ×2 IMPLANT
TRAY DSU PREP LF (CUSTOM PROCEDURE TRAY) ×2 IMPLANT
UNDERPAD 30X30 INCONTINENT (UNDERPADS AND DIAPERS) ×2 IMPLANT

## 2014-04-03 NOTE — Discharge Instructions (Signed)
KEEP BANDAGE CLEAN AND DRY CALL OFFICE FOR F/U APPT [phone] i 14 days KEEP HAND ELEVATED ABOVE HEART OK TO APPLY ICE TO OPERATIVE AREA CONTACT OFFICE IF ANY WORSENING PAIN OR CONCERNS. Post Anesthesia Home Care Instructions  Activity: Get plenty of rest for the remainder of the day. A responsible adult should stay with you for 24 hours following the procedure.  For the next 24 hours, DO NOT: -Drive a car -Paediatric nurse -Drink alcoholic beverages -Take any medication unless instructed by your physician -Make any legal decisions or sign important papers.  Meals: Start with liquid foods such as gelatin or soup. Progress to regular foods as tolerated. Avoid greasy, spicy, heavy foods. If nausea and/or vomiting occur, drink only clear liquids until the nausea and/or vomiting subsides. Call your physician if vomiting continues.  Special Instructions/Symptoms: Your throat may feel dry or sore from the anesthesia or the breathing tube placed in your throat during surgery. If this causes discomfort, gargle with warm salt water. The discomfort should disappear within 24 hours.       HAND SURGERY    HOME CARE INSTRUCTIONS    The following instructions have been prepared to help you care for yourself upon your return home today.  Wound Care:  Keep your hand elevated above the level of your heart. Do not allow it to dangle by your side. Keep the dressing dry and do not remove it unless your doctor advises you to do so. He will usually change it at the time of you post-op visit. Moving your fingers is advised to stimulate circulation but will depend on the site of your surgery. Of course, if you have a splint applied your doctor will advise you about movement.  Activity:  Do not drive or operate machinery today. Rest today and then you may return to your normal activity and work as indicated by your physician.  Diet: Drink liquids today or eat a light diet. You may resume a regular diet  tomorrow.  General expectations: Pain for two or three days. Fingers may become slightly swollen.   Unexpected Observations- Call your doctor if any of these occur: Severe pain not relieved by pain medication. Elevated temperature. Dressing soaked with blood. Inability to move fingers. White or bluish color to fingers.      Post Anesthesia Home Care Instructions  Activity: Get plenty of rest for the remainder of the day. A responsible adult should stay with you for 24 hours following the procedure.  For the next 24 hours, DO NOT: -Drive a car -Paediatric nurse -Drink alcoholic beverages -Take any medication unless instructed by your physician -Make any legal decisions or sign important papers.  Meals: Start with liquid foods such as gelatin or soup. Progress to regular foods as tolerated. Avoid greasy, spicy, heavy foods. If nausea and/or vomiting occur, drink only clear liquids until the nausea and/or vomiting subsides. Call your physician if vomiting continues.  Special Instructions/Symptoms: Your throat may feel dry or sore from the anesthesia or the breathing tube placed in your throat during surgery. If this causes discomfort, gargle with warm salt water. The discomfort should disappear within 24 hours.

## 2014-04-03 NOTE — H&P (Signed)
Christine Washington is an 78 y.o. female.   Chief Complaint: left hand pain and numbness HPI: pt followed in office Pt concerned about numbness and tingling in left hand Pt here for surgery on left hand No prior surgery to left hand,carpal tunnel  Past Medical History  Diagnosis Date  . Hypertension   . Hyperlipidemia   . OA (osteoarthritis) of knee     left- Dr Latanya Maudlin  . Depression   . GERD (gastroesophageal reflux disease)     Dr Olevia Perches  . Diverticulosis   . Anxiety   . Cervical disc disease   . Diabetic neuropathy   . Type 2 diabetes mellitus   . H/O hiatal hernia   . Carpal tunnel syndrome on left   . OSA on CPAP   . History of wrist fracture     LEFT --  09-02-2013/   RESIDUAL DISCOMFORT  . Wears glasses   . History of uterine fibroid     Past Surgical History  Procedure Laterality Date  . Nasal sinus surgery  1990  . Breast reduction surgery  1995  . Total knee arthroplasty Bilateral RIGHT  10-19-2004/   LEFT  10-31-2007  . Knee arthroscopy w/ meniscectomy Left 01-03-2006  . Cardiovascular stress test  12-21-2007    NORMAL NUCLEAR STUDY/  NO ISCHEMIA/  EF 76%  . Transthoracic echocardiogram  12-21-2007    MILD TO MODERATE LAE/  EF 55%/  MILD AV CALCIFICATION WITH NO STENOSIS  . Abdominal hysterectomy  1970's    AND APPENDECTOMY  . Exploratory laparotomy/  lysis adhesions/  bilateral salpingoophorectomy  1980's  . Tonsillectomy  AGE 25    Family History  Problem Relation Age of Onset  . Hypertension Mother   . Stroke Mother   . Diabetes Mother   . Stroke Father 4  . Hypertension Father   . Cancer Father     melanoma  . Multiple sclerosis Sister     died  . COPD Sister   . Colon cancer Neg Hx    Social History:  reports that she has never smoked. She has never used smokeless tobacco. She reports that she does not drink alcohol or use illicit drugs.  Allergies:  Allergies  Allergen Reactions  . Ace Inhibitors Cough    Lotensin=REACTION: cough  .  Bupropion Hcl Other (See Comments)    Wellbutrin=REACTION: hallucinations  . Lipitor [Atorvastatin] Other (See Comments)    memory  . Pioglitazone Other (See Comments)    Actos=REACTION: headache    Medications Prior to Admission  Medication Sig Dispense Refill  . alendronate (FOSAMAX) 70 MG tablet TAKE 1 TABLET BY MOUTH EVERY 7 DAYS WITH A FULL GLASS OF WATER ON AN EMPTY STOMACH  4 tablet  11  . ALPRAZolam (XANAX) 0.25 MG tablet TAKE 1 TABLET BY MOUTH TWICE A DAY AS NEEDED FOR ANXIETY  60 tablet  0  . amLODipine (NORVASC) 5 MG tablet TAKE 1 TABLET BY MOUTH EVERY DAY--- takes in pm      . aspirin 81 MG tablet Take 81 mg by mouth daily.        . Cholecalciferol (VITAMIN D3) 5000 UNITS TABS Take 1 capsule by mouth daily.       . DULoxetine (CYMBALTA) 60 MG capsule Take 1 capsule (60 mg total) by mouth daily.  90 capsule  1  . losartan-hydrochlorothiazide (HYZAAR) 100-12.5 MG per tablet TAKE 1 TABLET BY MOUTH DAILY.      . metFORMIN (GLUCOPHAGE) 500 MG tablet  TAKE 1 TABLET (500 MG TOTAL) BY MOUTH DAILY WITH BREAKFAST.  30 tablet  3  . omeprazole (PRILOSEC) 40 MG capsule TAKE ONE CAPSULE BY MOUTH EVERY DAY--  takes in pm      . pravastatin (PRAVACHOL) 80 MG tablet TAKE 1 TABLET BY MOUTH EVERY EVENING  30 tablet  3  . QUEtiapine (SEROQUEL) 50 MG tablet TAKE 1 TABLET BY MOUTH AT BEDTIME  30 tablet  3    Results for orders placed during the hospital encounter of 04/03/14 (from the past 48 hour(s))  POCT I-STAT, CHEM 8     Status: Abnormal   Collection Time    04/03/14 11:58 AM      Result Value Ref Range   Sodium 142  137 - 147 mEq/L   Potassium 3.6 (*) 3.7 - 5.3 mEq/L   Chloride 100  96 - 112 mEq/L   BUN 13  6 - 23 mg/dL   Creatinine, Ser 0.90  0.50 - 1.10 mg/dL   Glucose, Bld 121 (*) 70 - 99 mg/dL   Calcium, Ion 1.26  1.13 - 1.30 mmol/L   TCO2 28  0 - 100 mmol/L   Hemoglobin 14.3  12.0 - 15.0 g/dL   HCT 42.0  36.0 - 46.0 %   No results found.  ROS no recent illnesses or  hospitalizations  Blood pressure 127/73, pulse 66, temperature 97.4 F (36.3 C), temperature source Oral, resp. rate 16, height 5\' 6"  (1.676 m), weight 81.647 kg (180 lb), SpO2 96.00%. Physical Exam  General Appearance:  Alert, cooperative, no distress, appears stated age  Head:  Normocephalic, without obvious abnormality, atraumatic  Eyes:  Pupils equal, conjunctiva/corneas clear,         Throat: Lips, mucosa, and tongue normal; teeth and gums normal  Neck: No visible masses     Lungs:   respirations unlabored  Chest Wall:  No tenderness or deformity  Heart:  Regular rate and rhythm,  Abdomen:   Soft, non-tender,         Extremities: Left hand: mild thenar muscle atrophy, able to flex and extend digits Fingers warm well perfused  Pulses: 2+ and symmetric  Skin: Skin color, texture, turgor normal, no rashes or lesions     Neurologic: Normal    Assessment/Plan Left hand carpal tunnel syndrome  Left hand carpal tunnel release  R/B/A DISCUSSED WITH PT IN OFFICE.  PT VOICED UNDERSTANDING OF PLAN CONSENT SIGNED DAY OF SURGERY PT SEEN AND EXAMINED PRIOR TO OPERATIVE PROCEDURE/DAY OF SURGERY SITE MARKED. QUESTIONS ANSWERED WILL [facility] FOLLOWING SURGERY  Linna Hoff 04/03/2014, 12:49 PM

## 2014-04-03 NOTE — Brief Op Note (Signed)
04/03/2014  12:51 PM  PATIENT:  Christine Washington  78 y.o. female  PRE-OPERATIVE DIAGNOSIS:  LEFT CARPAL TUNNEL SYNDROME  POST-OPERATIVE DIAGNOSIS:  LEFT CARPAL TUNNEL SYNDROME  PROCEDURE:  Procedure(s) with comments: LEFT CARPAL TUNNEL RELEASE (Left) - ANESTHESIA: LOCAL WITH IV SEDATION  SURGEON:  Surgeon(s) and Role:    * Linna Hoff, MD - Primary  PHYSICIAN ASSISTANT:   ASSISTANTS: none   ANESTHESIA:   local  EBL:     BLOOD ADMINISTERED:none  DRAINS: none   LOCAL MEDICATIONS USED:  MARCAINE     SPECIMEN:  No Specimen  DISPOSITION OF SPECIMEN:  N/A  COUNTS:  YES  TOURNIQUET:    DICTATION: [id number]  PLAN OF CARE: Discharge to home after PACU  PATIENT DISPOSITION:  PACU - hemodynamically stable.   Delay start of Pharmacological VTE agent (>24hrs) due to surgical blood loss or risk of bleeding: not applicable

## 2014-04-03 NOTE — Transfer of Care (Signed)
Immediate Anesthesia Transfer of Care Note  Patient: Christine Washington  Procedure(s) Performed: Procedure(s) with comments: LEFT CARPAL TUNNEL RELEASE (Left) - ANESTHESIA: LOCAL WITH IV SEDATION  Patient Location: PACU  Anesthesia Type:MAC  Level of Consciousness: awake, alert  and oriented  Airway & Oxygen Therapy: Patient Spontanous Breathing  Post-op Assessment: Report given to PACU RN  Post vital signs: Reviewed and stable  Complications: No apparent anesthesia complications

## 2014-04-03 NOTE — Anesthesia Preprocedure Evaluation (Signed)
Anesthesia Evaluation  Patient identified by MRN, date of birth, ID band Patient awake    Reviewed: Allergy & Precautions, H&P , NPO status , Patient's Chart, lab work & pertinent test results  Airway Mallampati: II TM Distance: >3 FB Neck ROM: Full    Dental  (+) Teeth Intact, Caps, Dental Advisory Given   Pulmonary sleep apnea and Continuous Positive Airway Pressure Ventilation ,  breath sounds clear to auscultation  Pulmonary exam normal       Cardiovascular hypertension, Pt. on medications negative cardio ROS  Rhythm:Regular Rate:Normal     Neuro/Psych  Headaches, Anxiety Depression negative psych ROS   GI/Hepatic negative GI ROS, Neg liver ROS, hiatal hernia, GERD-  Medicated,  Endo/Other  negative endocrine ROSdiabetes, Type 2, Oral Hypoglycemic Agents  Renal/GU negative Renal ROS  negative genitourinary   Musculoskeletal negative musculoskeletal ROS (+)   Abdominal   Peds  Hematology negative hematology ROS (+)   Anesthesia Other Findings   Reproductive/Obstetrics                           Anesthesia Physical Anesthesia Plan  ASA: II  Anesthesia Plan: MAC   Post-op Pain Management:    Induction: Intravenous  Airway Management Planned: Simple Face Mask  Additional Equipment:   Intra-op Plan:   Post-operative Plan:   Informed Consent:   Dental advisory given  Plan Discussed with: CRNA  Anesthesia Plan Comments:         Anesthesia Quick Evaluation

## 2014-04-04 ENCOUNTER — Encounter (HOSPITAL_BASED_OUTPATIENT_CLINIC_OR_DEPARTMENT_OTHER): Payer: Self-pay | Admitting: Orthopedic Surgery

## 2014-04-04 NOTE — Op Note (Signed)
Christine Washington, Christine Washington               ACCOUNT NO.:  [id number]  MEDICAL RECORD NO.:  [id number]  LOCATION:                                 FACILITY:  PHYSICIAN:  Melrose Nakayama, MD  DATE OF BIRTH:  29-Oct-1936  DATE OF PROCEDURE:  04/03/2014 DATE OF DISCHARGE:  04/03/2014                              OPERATIVE REPORT   PREOPERATIVE DIAGNOSIS:  Left hand carpal tunnel syndrome.  POSTOPERATIVE DIAGNOSIS:  Left hand carpal tunnel syndrome.  ATTENDING PHYSICIAN:  Linna Hoff IV, MD, who scrubbed and present for the entire procedure.  ASSISTANT SURGEON:  None.  ANESTHESIA:  Xylocaine 1%, 0.25% Marcaine local block with IV sedation.  SURGICAL PROCEDURE:  Left hand carpal tunnel release.  SURGICAL INDICATIONS:  Christine Washington is a right-hand-dominant female with signs and symptoms consistent of left hand carpal tunnel syndrome. There has been a fracture and  conservative treatment.  Risks, benefits and alternatives were discussed in detail with the patient and signed informed consent was obtained.  Risks include, but not limited to bleeding, infection, damage to nearby nerves, arteries, or tendons, loss of motion of wrist and digits, incomplete relief of symptoms and need for further surgical intervention.  DESCRIPTION OF PROCEDURE:  The patient was properly identified in the preoperative holding area and permanent marker made on the left hand to indicate correct operative site.  The patient was then brought back to the operating room, placed in supine on anesthesia room table where the IV sedation was administered.  The patient tolerated this well.  A well- padded tourniquet was placed on left brachium sealed with 1000 drape. Local anesthetic was administered.  Left upper extremity was then prepped and draped in normal sterile fashion.  Time-out was called, correct side was identified, and procedure then begun.  Attention was then turned to the left hand where 7 cm incision made  directly in the mid palm.  Tourniquet insufflated.  Dissection was carried down through the skin and subcutaneous tissue where the palmar fascia was incised longitudinally.  Further exposure was then carried out to the transverse carpal ligament where under direct visualization, distal 1/2 transverse carpal ligament released in its entirety.  Further exposure was then carried out proximally.  The remaining portion of transverse carpal ligament and antebrachial fascia were then released.  The contents of carpal canal were then inspected.  No other abnormalities noted.  The wound was then irrigated.  The skin was then closed using horizontal mattress of Prolene sutures.  Xeroform dressing, sterile compressive bandage applied.  The patient tolerated the procedure well and returned to recovery room in good condition.  POSTPROCEDURE PLAN:  The patient will be discharged home, see me back in the office approximately in 2 weeks for wound check, suture removal, and begin a postoperative carpal tunnel eval and treat.     Melrose Nakayama, MD   ______________________________ Melrose Nakayama, MD    FWO/MEDQ  D:  04/03/2014  T:  04/04/2014  Job:  [id number]

## 2014-04-04 NOTE — Anesthesia Postprocedure Evaluation (Signed)
  Anesthesia Post-op Note  Patient: Christine Washington  Procedure(s) Performed: Procedure(s) (LRB): LEFT CARPAL TUNNEL RELEASE (Left)  Patient Location: PACU  Anesthesia Type: MAC  Level of Consciousness: awake and alert   Airway and Oxygen Therapy: Patient Spontanous Breathing  Post-op Pain: mild  Post-op Assessment: Post-op Vital signs reviewed, Patient's Cardiovascular Status Stable, Respiratory Function Stable, Patent Airway and No signs of Nausea or vomiting  Last Vitals:  Filed Vitals:   04/03/14 1450  BP: 140/70  Pulse: 65  Temp: 36.1 C  Resp: 18    Post-op Vital Signs: stable   Complications: No apparent anesthesia complications

## 2014-04-08 ENCOUNTER — Other Ambulatory Visit: Payer: Self-pay | Admitting: Family Medicine

## 2014-04-26 ENCOUNTER — Other Ambulatory Visit: Payer: Self-pay | Admitting: Family Medicine

## 2014-04-28 ENCOUNTER — Telehealth: Payer: Self-pay | Admitting: *Deleted

## 2014-04-28 ENCOUNTER — Other Ambulatory Visit: Payer: Self-pay | Admitting: Family Medicine

## 2014-04-28 NOTE — Telephone Encounter (Signed)
Tried to contact pt but no answer and no VM.  

## 2014-04-30 ENCOUNTER — Other Ambulatory Visit: Payer: Self-pay | Admitting: Family Medicine

## 2014-05-07 ENCOUNTER — Other Ambulatory Visit: Payer: Self-pay | Admitting: Family Medicine

## 2014-05-13 ENCOUNTER — Other Ambulatory Visit: Payer: Self-pay | Admitting: Family Medicine

## 2014-05-21 ENCOUNTER — Other Ambulatory Visit: Payer: Self-pay | Admitting: Family Medicine

## 2014-05-23 ENCOUNTER — Ambulatory Visit (INDEPENDENT_AMBULATORY_CARE_PROVIDER_SITE_OTHER): Payer: 59 | Admitting: Family Medicine

## 2014-05-23 ENCOUNTER — Encounter: Payer: Self-pay | Admitting: Family Medicine

## 2014-05-23 VITALS — BP 125/69 | HR 86 | Ht 66.0 in | Wt 179.0 lb

## 2014-05-23 DIAGNOSIS — Z1231 Encounter for screening mammogram for malignant neoplasm of breast: Secondary | ICD-10-CM

## 2014-05-23 DIAGNOSIS — E119 Type 2 diabetes mellitus without complications: Secondary | ICD-10-CM

## 2014-05-23 DIAGNOSIS — E785 Hyperlipidemia, unspecified: Secondary | ICD-10-CM

## 2014-05-23 DIAGNOSIS — N951 Menopausal and female climacteric states: Secondary | ICD-10-CM

## 2014-05-23 DIAGNOSIS — I10 Essential (primary) hypertension: Secondary | ICD-10-CM

## 2014-05-23 DIAGNOSIS — R232 Flushing: Secondary | ICD-10-CM

## 2014-05-23 LAB — COMPLETE METABOLIC PANEL WITH GFR
ALT: 11 U/L (ref 0–35)
AST: 21 U/L (ref 0–37)
Albumin: 4.6 g/dL (ref 3.5–5.2)
Alkaline Phosphatase: 62 U/L (ref 39–117)
BUN: 19 mg/dL (ref 6–23)
CALCIUM: 9.6 mg/dL (ref 8.4–10.5)
CO2: 27 meq/L (ref 19–32)
CREATININE: 0.97 mg/dL (ref 0.50–1.10)
Chloride: 100 mEq/L (ref 96–112)
GFR, EST AFRICAN AMERICAN: 66 mL/min
GFR, EST NON AFRICAN AMERICAN: 57 mL/min — AB
Glucose, Bld: 114 mg/dL — ABNORMAL HIGH (ref 70–99)
Potassium: 4.1 mEq/L (ref 3.5–5.3)
Sodium: 139 mEq/L (ref 135–145)
Total Bilirubin: 0.9 mg/dL (ref 0.2–1.2)
Total Protein: 7.2 g/dL (ref 6.0–8.3)

## 2014-05-23 LAB — LIPID PANEL
CHOLESTEROL: 166 mg/dL (ref 0–200)
HDL: 46 mg/dL (ref >39)
LDL Cholesterol: 92 mg/dL (ref 0–99)
TRIGLYCERIDES: 138 mg/dL (ref <150)
Total CHOL/HDL Ratio: 3.6 Ratio
VLDL: 28 mg/dL (ref 0–40)

## 2014-05-23 LAB — CBC
HEMATOCRIT: 40.8 % (ref 36.0–46.0)
Hemoglobin: 14 g/dL (ref 12.0–15.0)
MCH: 27.9 pg (ref 26.0–34.0)
MCHC: 34.3 g/dL (ref 30.0–36.0)
MCV: 81.3 fL (ref 78.0–100.0)
Platelets: 241 10*3/uL (ref 150–400)
RBC: 5.02 MIL/uL (ref 3.87–5.11)
RDW: 14.5 % (ref 11.5–15.5)
WBC: 7.1 10*3/uL (ref 4.0–10.5)

## 2014-05-23 LAB — POCT GLYCOSYLATED HEMOGLOBIN (HGB A1C): Hemoglobin A1C: 6.3

## 2014-05-23 MED ORDER — PRAVASTATIN SODIUM 80 MG PO TABS: ORAL_TABLET | ORAL | Status: DC

## 2014-05-23 NOTE — Progress Notes (Signed)
Subjective:    Patient ID: Christine Washington, female    DOB: [birth year], 78 y.o.   MRN: [id number]  HPI Hypertension- Pt denies chest pain, SOB, dizziness, or heart palpitations.  Taking meds as directed w/o problems.  Denies medication side effects.    Depression- Mood is well controlled.  Only complains of fatigue. Feels good overall. Did have a fracture on the left arm.    Diabetes - no hypoglycemic events. No wounds or sores that are not healing well. No increased thirst or urination. Checking glucose at home. Taking medications as prescribed without any side effects.    Review of Systems Having some hotflashes.   BP 125/69  Pulse 86  Ht 5\' 6"  (1.676 m)  Wt 179 lb (81.194 kg)  BMI 28.91 kg/m2    Allergies  Allergen Reactions  . Ace Inhibitors Cough    Lotensin=REACTION: cough  . Bupropion Hcl Other (See Comments)    Wellbutrin=REACTION: hallucinations  . Lipitor [Atorvastatin] Other (See Comments)    memory  . Pioglitazone Other (See Comments)    Actos=REACTION: headache    Past Medical History  Diagnosis Date  . Hypertension   . Hyperlipidemia   . OA (osteoarthritis) of knee     left- Dr Latanya Maudlin  . Depression   . GERD (gastroesophageal reflux disease)     Dr Olevia Perches  . Diverticulosis   . Anxiety   . Cervical disc disease   . Diabetic neuropathy   . Type 2 diabetes mellitus   . H/O hiatal hernia   . Carpal tunnel syndrome on left   . OSA on CPAP   . History of wrist fracture     LEFT --  09-02-2013/   RESIDUAL DISCOMFORT  . Wears glasses   . History of uterine fibroid     Past Surgical History  Procedure Laterality Date  . Nasal sinus surgery  1990  . Breast reduction surgery  1995  . Total knee arthroplasty Bilateral RIGHT  10-19-2004/   LEFT  10-31-2007  . Knee arthroscopy w/ meniscectomy Left 01-03-2006  . Cardiovascular stress test  12-21-2007    NORMAL NUCLEAR STUDY/  NO ISCHEMIA/  EF 76%  . Transthoracic echocardiogram  12-21-2007    MILD TO  MODERATE LAE/  EF 55%/  MILD AV CALCIFICATION WITH NO STENOSIS  . Abdominal hysterectomy  1970's    AND APPENDECTOMY  . Exploratory laparotomy/  lysis adhesions/  bilateral salpingoophorectomy  1980's  . Tonsillectomy  AGE 71  . Carpal tunnel release Left 04/03/2014    Procedure: LEFT CARPAL TUNNEL RELEASE;  Surgeon: Linna Hoff, MD;  Location: [facility];  Service: Orthopedics;  Laterality: Left;  ANESTHESIA: LOCAL WITH IV SEDATION    History   Social History  . Marital Status: Divorced    Spouse Name: N/A    Number of Children: N/A  . Years of Education: N/A   Occupational History  . Not on file.   Social History Main Topics  . Smoking status: Never Smoker   . Smokeless tobacco: Never Used  . Alcohol Use: No  . Drug Use: No  . Sexual Activity: Not on file   Other Topics Concern  . 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 Her sister died in 2011-12-11.    Family History  Problem Relation Age of Onset  . Hypertension Mother   .  Stroke Mother   . Diabetes Mother   . Stroke Father 65  . Hypertension Father   . Cancer Father     melanoma  . Multiple sclerosis Sister     died  . COPD Sister   . Colon cancer Neg Hx     Outpatient Encounter Prescriptions as of 05/23/2014  Medication Sig  . alendronate (FOSAMAX) 70 MG tablet TAKE 1 TABLET BY MOUTH EVERY 7 DAYS WITH A FULL GLASS OF WATER ON AN EMPTY STOMACH  . ALPRAZolam (XANAX) 0.25 MG tablet TAKE 1 TABLET BY MOUTH TWICE A DAY AS NEEDED FOR ANXIETY  . amLODipine (NORVASC) 5 MG tablet TAKE 1 TABLET BY MOUTH EVERY DAY--- takes in pm  . aspirin 81 MG tablet Take 81 mg by mouth daily.    . Cholecalciferol (VITAMIN D3) 5000 UNITS TABS Take 1 capsule by mouth daily.   . DULoxetine (CYMBALTA) 60 MG capsule Take 1 capsule (60 mg total) by mouth daily.  Marland Kitchen losartan-hydrochlorothiazide (HYZAAR) 100-12.5 MG per tablet TAKE 1 TABLET BY  MOUTH DAILY.  . metFORMIN (GLUCOPHAGE) 500 MG tablet TAKE 1 TABLET (500 MG TOTAL) BY MOUTH DAILY WITH BREAKFAST.  Marland Kitchen omeprazole (PRILOSEC) 40 MG capsule TAKE ONE CAPSULE BY MOUTH EVERY DAY  . pravastatin (PRAVACHOL) 80 MG tablet TAKE 1 TABLET BY MOUTH EVERY EVENING  . [DISCONTINUED] docusate sodium (COLACE) 100 MG capsule Take 1 capsule (100 mg total) by mouth 2 (two) times daily.  . [DISCONTINUED] Hydrocodone-Acetaminophen (VICODIN) 5-300 MG TABS Take 1 tablet by mouth 4 (four) times daily as needed (PAIN).  . [DISCONTINUED] losartan-hydrochlorothiazide (HYZAAR) 100-12.5 MG per tablet TAKE 1 TABLET BY MOUTH DAILY. **NEEDS APPT**  . [DISCONTINUED] omeprazole (PRILOSEC) 40 MG capsule TAKE ONE CAPSULE BY MOUTH EVERY DAY--  takes in pm  . [DISCONTINUED] omeprazole (PRILOSEC) 40 MG capsule TAKE ONE CAPSULE BY MOUTH EVERY DAY  . [DISCONTINUED] pravastatin (PRAVACHOL) 80 MG tablet TAKE 1 TABLET BY MOUTH EVERY EVENING  . [DISCONTINUED] QUEtiapine (SEROQUEL) 50 MG tablet TAKE 1 TABLET BY MOUTH AT BEDTIME          Objective:   Physical Exam  Constitutional: She is oriented to person, place, and time. She appears well-developed and well-nourished.  HENT:  Head: Normocephalic and atraumatic.  Cardiovascular: Normal rate, regular rhythm and normal heart sounds.   Pulmonary/Chest: Effort normal and breath sounds normal.  Neurological: She is alert and oriented to person, place, and time.  Skin: Skin is warm and dry.  Psychiatric: She has a normal mood and affect. Her behavior is normal.          Assessment & Plan:  Hypertension-well-controlled. Continue current regimen. Follow up in 6 months.  Depression-PHQ 9 score of 1 today, well-controlled.continue current regimen.  F/U in 4-6 mo.   DM-  Well controlled at 6.3.   foot exam performed today. A1c completed today. We'll order lipid panel and CMP. On ARB, ASA  Due for mammo

## 2014-05-24 LAB — TSH: TSH: 2.081 u[IU]/mL (ref 0.350–4.500)

## 2014-06-03 ENCOUNTER — Ambulatory Visit (INDEPENDENT_AMBULATORY_CARE_PROVIDER_SITE_OTHER): Payer: 59

## 2014-06-03 DIAGNOSIS — Z1231 Encounter for screening mammogram for malignant neoplasm of breast: Secondary | ICD-10-CM

## 2014-06-04 ENCOUNTER — Other Ambulatory Visit: Payer: Self-pay | Admitting: Family Medicine

## 2014-06-10 ENCOUNTER — Other Ambulatory Visit: Payer: Self-pay | Admitting: Family Medicine

## 2014-06-24 ENCOUNTER — Ambulatory Visit: Payer: Medicare Other

## 2014-07-07 ENCOUNTER — Other Ambulatory Visit: Payer: Self-pay | Admitting: Family Medicine

## 2014-07-29 ENCOUNTER — Ambulatory Visit (INDEPENDENT_AMBULATORY_CARE_PROVIDER_SITE_OTHER): Payer: Medicare Other

## 2014-07-29 DIAGNOSIS — B351 Tinea unguium: Secondary | ICD-10-CM

## 2014-07-29 DIAGNOSIS — M79609 Pain in unspecified limb: Secondary | ICD-10-CM

## 2014-07-29 DIAGNOSIS — M79606 Pain in leg, unspecified: Secondary | ICD-10-CM

## 2014-07-29 DIAGNOSIS — E1142 Type 2 diabetes mellitus with diabetic polyneuropathy: Secondary | ICD-10-CM

## 2014-07-29 DIAGNOSIS — Q828 Other specified congenital malformations of skin: Secondary | ICD-10-CM

## 2014-07-29 DIAGNOSIS — E1140 Type 2 diabetes mellitus with diabetic neuropathy, unspecified: Secondary | ICD-10-CM

## 2014-07-29 DIAGNOSIS — E1149 Type 2 diabetes mellitus with other diabetic neurological complication: Secondary | ICD-10-CM

## 2014-07-29 NOTE — Progress Notes (Signed)
   Subjective:    Patient ID: Christine Washington, female    DOB: May 23, 1936, 78 y.o.   MRN: [id number]  HPI Comments: Pt states she needs her 10 toenails trimmed and the callouses to the right 2, 4th and left 4th MPJ plantar and the right 1st interdigital toes.     Review of Systems no new findings or systemic changes noted     Objective:   Physical Exam 78 year old... this time well-developed well-nourished return 3 for continued followup diabetic foot care nails thick brittle crumbly friable dystrophic debrided 1 through 5 bilateral also has keratoses subsecond third MTP area bilateral plantigrade metatarsals his interdigital keratoses in one to the HAV deformity and hammertoe deformity. Adductovarus rotated fourth and fifth digits noted bilateral. Orthopedic biomechanical exam rectus foot type digital contractures hammertoe deformities and HAV deformity noted no open wounds ulcerations no secondary infection is noted multiple keratoses again subcutaneous bilateral and pinch callus of the first bilateral. Decreased epicritic sensation confirmed [place] Mule testing to the forefoot and toes       Assessment & Plan:  Assessment diabetes with complications and peripheral neuropathy. Painful mycotic nails debrided 1 through 5 bilateral at this time also debridement of keratoses sub-to 3 bilateral and pinch callus of the hallux on debridement keratoses are dispensed and tubercle padding to keep the hallux and second digit separated into the second digit from rubbing against the shoes patient is a candidate for new diabetic accident shoes we'll obtain authorization from her primary physician for diabetic shoes and insoles her old shoes are several years old and need replacing at this time. Patient does have contractures keratoses and pre-also keratoses and diabetes with neuropathy issues  Harriet Masson DPM

## 2014-07-29 NOTE — Patient Instructions (Signed)
Diabetes and Foot Care Diabetes may cause you to have problems because of poor blood supply (circulation) to your feet and legs. This may cause the skin on your feet to become thinner, break easier, and heal more slowly. Your skin may become dry, and the skin may peel and crack. You may also have nerve damage in your legs and feet causing decreased feeling in them. You may not notice minor injuries to your feet that could lead to infections or more serious problems. Taking care of your feet is one of the most important things you can do for yourself.  HOME CARE INSTRUCTIONS  Wear shoes at all times, even in the house. Do not go barefoot. Bare feet are easily injured.  Check your feet daily for blisters, cuts, and redness. If you cannot see the bottom of your feet, use a mirror or ask someone for help.  Wash your feet with warm water (do not use hot water) and mild soap. Then pat your feet and the areas between your toes until they are completely dry. Do not soak your feet as this can dry your skin.  Apply a moisturizing lotion or petroleum jelly (that does not contain alcohol and is unscented) to the skin on your feet and to dry, brittle toenails. Do not apply lotion between your toes.  Trim your toenails straight across. Do not dig under them or around the cuticle. File the edges of your nails with an emery board or nail file.  Do not cut corns or calluses or try to remove them with medicine.  Wear clean socks or stockings every day. Make sure they are not too tight. Do not wear knee-high stockings since they may decrease blood flow to your legs.  Wear shoes that fit properly and have enough cushioning. To break in new shoes, wear them for just a few hours a day. This prevents you from injuring your feet. Always look in your shoes before you put them on to be sure there are no objects inside.  Do not cross your legs. This may decrease the blood flow to your feet.  If you find a minor scrape,  cut, or break in the skin on your feet, keep it and the skin around it clean and dry. These areas may be cleansed with mild soap and water. Do not cleanse the area with peroxide, alcohol, or iodine.  When you remove an adhesive bandage, be sure not to damage the skin around it.  If you have a wound, look at it several times a day to make sure it is healing.  Do not use heating pads or hot water bottles. They may burn your skin. If you have lost feeling in your feet or legs, you may not know it is happening until it is too late.  Make sure your health care provider performs a complete foot exam at least annually or more often if you have foot problems. Report any cuts, sores, or bruises to your health care provider immediately. SEEK MEDICAL CARE IF:   You have an injury that is not healing.  You have cuts or breaks in the skin.  You have an ingrown nail.  You notice redness on your legs or feet.  You feel burning or tingling in your legs or feet.  You have pain or cramps in your legs and feet.  Your legs or feet are numb.  Your feet always feel cold. SEEK IMMEDIATE MEDICAL CARE IF:   There is increasing redness,   swelling, or pain in or around a wound.  There is a red line that goes up your leg.  Pus is coming from a wound.  You develop a fever or as directed by your health care provider.  You notice a bad smell coming from an ulcer or wound. Document Released: 12/02/2000 Document Revised: 08/07/2013 Document Reviewed: 05/14/2013 Exit[employer] Patient Information 2015 [employer]. This information is not intended to replace advice given to you by your health care provider. Make sure you discuss any questions you have with your health care provider.  

## 2014-08-11 ENCOUNTER — Encounter: Payer: Self-pay | Admitting: Family Medicine

## 2014-08-13 ENCOUNTER — Other Ambulatory Visit: Payer: Self-pay | Admitting: Family Medicine

## 2014-08-18 ENCOUNTER — Other Ambulatory Visit: Payer: Self-pay | Admitting: Family Medicine

## 2014-08-21 ENCOUNTER — Other Ambulatory Visit: Payer: Self-pay

## 2014-08-21 MED ORDER — PRAVASTATIN SODIUM 80 MG PO TABS: ORAL_TABLET | ORAL | Status: DC

## 2014-08-21 MED ORDER — LOSARTAN POTASSIUM-HCTZ 100-12.5 MG PO TABS: 1.0000 | ORAL_TABLET | Freq: Every day | ORAL | Status: DC

## 2014-08-21 MED ORDER — METFORMIN HCL 500 MG PO TABS: 500.0000 mg | ORAL_TABLET | Freq: Every day | ORAL | Status: DC

## 2014-08-21 MED ORDER — QUETIAPINE FUMARATE 50 MG PO TABS: 50.0000 mg | ORAL_TABLET | Freq: Every day | ORAL | Status: DC

## 2014-08-21 MED ORDER — AMLODIPINE BESYLATE 5 MG PO TABS: 5.0000 mg | ORAL_TABLET | Freq: Every day | ORAL | Status: DC

## 2014-08-21 MED ORDER — OMEPRAZOLE 40 MG PO CPDR: 40.0000 mg | DELAYED_RELEASE_CAPSULE | Freq: Every day | ORAL | Status: DC

## 2014-08-22 ENCOUNTER — Ambulatory Visit (INDEPENDENT_AMBULATORY_CARE_PROVIDER_SITE_OTHER): Payer: 59 | Admitting: Family Medicine

## 2014-08-22 ENCOUNTER — Encounter: Payer: Self-pay | Admitting: Family Medicine

## 2014-08-22 VITALS — Wt 179.0 lb

## 2014-08-22 DIAGNOSIS — R61 Generalized hyperhidrosis: Secondary | ICD-10-CM

## 2014-08-22 DIAGNOSIS — R5383 Other fatigue: Secondary | ICD-10-CM

## 2014-08-22 DIAGNOSIS — I10 Essential (primary) hypertension: Secondary | ICD-10-CM

## 2014-08-22 DIAGNOSIS — E119 Type 2 diabetes mellitus without complications: Secondary | ICD-10-CM

## 2014-08-22 DIAGNOSIS — Z23 Encounter for immunization: Secondary | ICD-10-CM

## 2014-08-22 DIAGNOSIS — R5381 Other malaise: Secondary | ICD-10-CM

## 2014-08-22 DIAGNOSIS — M810 Age-related osteoporosis without current pathological fracture: Secondary | ICD-10-CM

## 2014-08-22 LAB — POCT UA - MICROALBUMIN
Albumin/Creatinine Ratio, Urine, POC: <30
Creatinine, POC: 300 mg/dL
Microalbumin Ur, POC: 30 mg/L

## 2014-08-22 LAB — POCT GLYCOSYLATED HEMOGLOBIN (HGB A1C): HEMOGLOBIN A1C: 6.4

## 2014-08-22 NOTE — Assessment & Plan Note (Signed)
Well-controlled on current regimen of losartan, HCT amlodipine.

## 2014-08-22 NOTE — Progress Notes (Signed)
Subjective:    Patient ID: Christine Washington, female    DOB: [birth year], 78 y.o.   MRN: [id number]  Diabetes  Hypertension   Diabetes - no hypoglycemic events. No wounds or sores that are not healing well. No increased thirst or urination. Checking glucose at home. Taking medications as prescribed without any side effects.  Hypertension- Pt denies chest pain, SOB, dizziness, or heart palpitations.  Taking meds as directed w/o problems.  Denies medication side effects.    Has been fatigued for 6-8 months. She says it mostly with activity. She says if she tries to go out and do something that time she gets home she is completely exhausted. She's been out in public when she just started to feel extremely tired and weak and started to actually sweat. She doesn't experience any chest pain or breathing problems when this happens. She had a normal thyroid level and CBC about 2 months ago.  Review of Systems     Wt 179 lb (81.194 kg)    Allergies  Allergen Reactions  . Ace Inhibitors Cough    Lotensin=REACTION: cough  . Bupropion Hcl Other (See Comments)    Wellbutrin=REACTION: hallucinations  . Lipitor [Atorvastatin] Other (See Comments)    memory  . Pioglitazone Other (See Comments)    Actos=REACTION: headache    Past Medical History  Diagnosis Date  . Hypertension   . Hyperlipidemia   . OA (osteoarthritis) of knee     left- Dr Latanya Maudlin  . Depression   . GERD (gastroesophageal reflux disease)     Dr Olevia Perches  . Diverticulosis   . Anxiety   . Cervical disc disease   . Diabetic neuropathy   . Type 2 diabetes mellitus   . H/O hiatal hernia   . Carpal tunnel syndrome on left   . OSA on CPAP   . History of wrist fracture     LEFT --  09-02-2013/   RESIDUAL DISCOMFORT  . Wears glasses   . History of uterine fibroid     Past Surgical History  Procedure Laterality Date  . Nasal sinus surgery  1990  . Breast reduction surgery  1995  . Total knee arthroplasty Bilateral RIGHT   10-19-2004/   LEFT  10-31-2007  . Knee arthroscopy w/ meniscectomy Left 01-03-2006  . Cardiovascular stress test  12-21-2007    NORMAL NUCLEAR STUDY/  NO ISCHEMIA/  EF 76%  . Transthoracic echocardiogram  12-21-2007    MILD TO MODERATE LAE/  EF 55%/  MILD AV CALCIFICATION WITH NO STENOSIS  . Abdominal hysterectomy  1970's    AND APPENDECTOMY  . Exploratory laparotomy/  lysis adhesions/  bilateral salpingoophorectomy  1980's  . Tonsillectomy  AGE 36  . Carpal tunnel release Left 04/03/2014    Procedure: LEFT CARPAL TUNNEL RELEASE;  Surgeon: Linna Hoff, MD;  Location: [facility];  Service: Orthopedics;  Laterality: Left;  ANESTHESIA: LOCAL WITH IV SEDATION    History   Social History  . Marital Status: Divorced    Spouse Name: N/A    Number of Children: N/A  . Years of Education: N/A   Occupational History  . Not on file.   Social History Main Topics  . Smoking status: Never Smoker   . Smokeless tobacco: Never Used  . Alcohol Use: No  . Drug Use: No  . Sexual Activity: Not on file   Other Topics Concern  . 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 Her sister died in 11-22-11.    Family History  Problem Relation Age of Onset  . Hypertension Mother   . Stroke Mother   . Diabetes Mother   . Stroke Father 46  . Hypertension Father   . Cancer Father     melanoma  . Multiple sclerosis Sister     died  . COPD Sister   . Colon cancer Neg Hx     Outpatient Encounter Prescriptions as of 08/22/2014  Medication Sig  . alendronate (FOSAMAX) 70 MG tablet TAKE 1 TABLET BY MOUTH EVERY 7 DAYS WITH A FULL GLASS OF WATER ON AN EMPTY STOMACH  . ALPRAZolam (XANAX) 0.25 MG tablet TAKE 1 TABLET BY MOUTH TWICE A DAY AS NEEDED FOR ANXIETY  . amLODipine (NORVASC) 5 MG tablet Take 1 tablet (5 mg total) by mouth daily.  Marland Kitchen aspirin 81 MG tablet Take 81 mg by mouth daily.    . Cholecalciferol  (VITAMIN D3) 5000 UNITS TABS Take 1 capsule by mouth daily.   . DULoxetine (CYMBALTA) 60 MG capsule TAKE 1 CAPSULE (60 MG TOTAL) BY MOUTH DAILY.  Marland Kitchen losartan-hydrochlorothiazide (HYZAAR) 100-12.5 MG per tablet Take 1 tablet by mouth daily.  . metFORMIN (GLUCOPHAGE) 500 MG tablet Take 1 tablet (500 mg total) by mouth daily with breakfast.  . omeprazole (PRILOSEC) 40 MG capsule Take 1 capsule (40 mg total) by mouth daily.  . pravastatin (PRAVACHOL) 80 MG tablet TAKE 1 TABLET BY MOUTH EVERY EVENING  . QUEtiapine (SEROQUEL) 50 MG tablet Take 1 tablet (50 mg total) by mouth daily.       Objective:   Physical Exam  Constitutional: She is oriented to person, place, and time. She appears well-developed and well-nourished.  HENT:  Head: Normocephalic and atraumatic.  Cardiovascular: Normal rate and regular rhythm.   Murmur heard. 2/6 SEM  Pulmonary/Chest: Effort normal and breath sounds normal.  Neurological: She is alert and oriented to person, place, and time.  Skin: Skin is warm and dry.  Psychiatric: She has a normal mood and affect. Her behavior is normal.          Assessment & Plan:  Flu shot given today.   Fatigue/diaphoresis-unclear etiology. Normal thyroid and blood count 2 months ago. We are going to check a vitamin D today since she did complain of bone pain and achiness. She also has a fairly pronounced murmur on exam today somewhat ahead and schedule her for an echocardiogram. She did have a normal stress test about 6 years ago in 2009 with Dr. Kirk Ruths.

## 2014-08-22 NOTE — Assessment & Plan Note (Addendum)
Well-controlled. Hemoglobin A1c is 6.4 today. Fairly stable. She is on an aspirin, ARB, and statin. Recent lipids are up to date. LDL was at goal at 92. Urine micro done today.

## 2014-08-23 LAB — VITAMIN D 25 HYDROXY (VIT D DEFICIENCY, FRACTURES): VIT D 25 HYDROXY: 51 ng/mL (ref 30–89)

## 2014-09-05 ENCOUNTER — Ambulatory Visit (INDEPENDENT_AMBULATORY_CARE_PROVIDER_SITE_OTHER): Payer: Medicare Other | Admitting: *Deleted

## 2014-09-05 ENCOUNTER — Ambulatory Visit: Payer: Medicare Other

## 2014-09-05 DIAGNOSIS — M2040 Other hammer toe(s) (acquired), unspecified foot: Secondary | ICD-10-CM

## 2014-09-05 NOTE — Progress Notes (Signed)
   Subjective:    Patient ID: Christine Washington, female    DOB: 1936/09/07, 78 y.o.   MRN: [id number]  HPI  DIABETIC SHOE MEASUREMENT.  Review of Systems     Objective:   Physical Exam        Assessment & Plan:

## 2014-09-10 ENCOUNTER — Encounter: Payer: 59 | Admitting: Family Medicine

## 2014-09-30 ENCOUNTER — Ambulatory Visit (INDEPENDENT_AMBULATORY_CARE_PROVIDER_SITE_OTHER): Payer: Medicare Other

## 2014-09-30 DIAGNOSIS — E1140 Type 2 diabetes mellitus with diabetic neuropathy, unspecified: Secondary | ICD-10-CM

## 2014-09-30 DIAGNOSIS — M2040 Other hammer toe(s) (acquired), unspecified foot: Secondary | ICD-10-CM

## 2014-09-30 NOTE — Progress Notes (Signed)
Pt is here to PUDS 

## 2014-09-30 NOTE — Patient Instructions (Signed)

## 2014-10-31 ENCOUNTER — Other Ambulatory Visit: Payer: Self-pay | Admitting: Family Medicine

## 2014-11-04 ENCOUNTER — Telehealth: Payer: Self-pay

## 2014-11-04 NOTE — Telephone Encounter (Signed)
PA required for 2D Echocardiogram without contrast. Approval # [id number] good through 12/19/2014. Gave to referral department to schedule.

## 2014-11-07 ENCOUNTER — Other Ambulatory Visit: Payer: Self-pay | Admitting: Family Medicine

## 2014-11-12 ENCOUNTER — Ambulatory Visit (HOSPITAL_BASED_OUTPATIENT_CLINIC_OR_DEPARTMENT_OTHER)
Admission: RE | Admit: 2014-11-12 | Discharge: 2014-11-12 | Disposition: A | Discharge status: Home / Self Care | Payer: Medicare Other | Source: Ambulatory Visit | Attending: Family Medicine | Admitting: Family Medicine

## 2014-11-12 DIAGNOSIS — E785 Hyperlipidemia, unspecified: Secondary | ICD-10-CM | POA: Diagnosis not present

## 2014-11-12 DIAGNOSIS — E119 Type 2 diabetes mellitus without complications: Secondary | ICD-10-CM | POA: Diagnosis not present

## 2014-11-12 DIAGNOSIS — R5383 Other fatigue: Secondary | ICD-10-CM

## 2014-11-12 DIAGNOSIS — I10 Essential (primary) hypertension: Secondary | ICD-10-CM | POA: Diagnosis not present

## 2014-11-12 DIAGNOSIS — R5381 Other malaise: Secondary | ICD-10-CM

## 2014-11-12 DIAGNOSIS — I059 Rheumatic mitral valve disease, unspecified: Secondary | ICD-10-CM

## 2014-11-12 DIAGNOSIS — R61 Generalized hyperhidrosis: Secondary | ICD-10-CM

## 2014-11-12 DIAGNOSIS — I340 Nonrheumatic mitral (valve) insufficiency: Secondary | ICD-10-CM | POA: Diagnosis not present

## 2014-11-12 DIAGNOSIS — R011 Cardiac murmur, unspecified: Secondary | ICD-10-CM | POA: Diagnosis present

## 2014-11-12 NOTE — Progress Notes (Signed)
*  PRELIMINARY RESULTS* Echocardiogram 2D Echocardiogram has been performed.  Leavy Cella 11/12/2014, 12:19 PM

## 2014-11-21 ENCOUNTER — Other Ambulatory Visit: Payer: Self-pay | Admitting: Family Medicine

## 2014-11-25 ENCOUNTER — Ambulatory Visit: Payer: Medicare Other

## 2014-12-01 ENCOUNTER — Encounter: Payer: Self-pay | Admitting: Family Medicine

## 2014-12-01 ENCOUNTER — Ambulatory Visit (INDEPENDENT_AMBULATORY_CARE_PROVIDER_SITE_OTHER): Payer: 59 | Admitting: Family Medicine

## 2014-12-01 VITALS — BP 122/76 | HR 90 | Ht 66.0 in | Wt 181.0 lb

## 2014-12-01 DIAGNOSIS — R5383 Other fatigue: Secondary | ICD-10-CM

## 2014-12-01 DIAGNOSIS — E119 Type 2 diabetes mellitus without complications: Secondary | ICD-10-CM

## 2014-12-01 DIAGNOSIS — M899 Disorder of bone, unspecified: Secondary | ICD-10-CM

## 2014-12-01 DIAGNOSIS — I10 Essential (primary) hypertension: Secondary | ICD-10-CM

## 2014-12-01 DIAGNOSIS — M898X9 Other specified disorders of bone, unspecified site: Secondary | ICD-10-CM

## 2014-12-01 LAB — POCT GLYCOSYLATED HEMOGLOBIN (HGB A1C): Hemoglobin A1C: 6.7

## 2014-12-01 NOTE — Progress Notes (Signed)
   Subjective:    Patient ID: Christine Washington, female    DOB: 10-May-1936, 78 y.o.   MRN: [id number]  HPI Diabetes - no hypoglycemic events. No wounds or sores that are not healing well. No increased thirst or urination. Checking glucose at home. Taking medications as prescribed without any side effects.  She has been really stressed with extended family issues.  Admits hasn't been eating the best.   Hypertension- Pt denies chest pain, SOB, dizziness, or heart palpitations.  Taking meds as directed w/o problems.  Denies medication side effects.    D/O of several months fatigue. She has a lot of joint an dknee pain.Says has been more sedentary. Says has been eating thing she shouldn't for the Holidays. Has been eating out more often.  She says she is exhausted when she gets back home from shopping.  She feels like her boes hurt some days.    Review of Systems     Objective:   Physical Exam  Constitutional: She is oriented to person, place, and time. She appears well-developed and well-nourished.  HENT:  Head: Normocephalic and atraumatic.  Cardiovascular: Normal rate and regular rhythm.   Murmur heard. 3/6 SEM best heard at the last sternal border. No carotid bruits.   Pulmonary/Chest: Effort normal and breath sounds normal.  Neurological: She is alert and oriented to person, place, and time.  Skin: Skin is warm and dry.  Psychiatric: She has a normal mood and affect. Her behavior is normal.          Assessment & Plan:  Diabetes-A1c is 6.7 today which is well controlled but still up a little bit from previous which was 6.4. Make sure working on diet and exercise. Follow-up in 3 months. She is due for BMP today. Lipids are up-to-date. She is on ARB and a statin.  Hypertension-Repat BP looks fantastic!  F/U in 3 months. Continue current regimen.  Fatigue- we'll check her thyroid level. We will recheck her CBC. She had an normal in 6 months ago but just to evaluate for anemia. She  also complains of aching in her bones and joints. So we will also check a vitamin D level. I did encourage her to get back in with her orthopedist she's been having a lot of knee pain and she is Re: Had bilateral knee replacements. She's worried they may tell her she needs surgery again so has been hesitant to go.

## 2014-12-02 ENCOUNTER — Ambulatory Visit (INDEPENDENT_AMBULATORY_CARE_PROVIDER_SITE_OTHER): Payer: Medicare Other | Admitting: Podiatry

## 2014-12-02 DIAGNOSIS — B351 Tinea unguium: Secondary | ICD-10-CM

## 2014-12-02 DIAGNOSIS — M79673 Pain in unspecified foot: Secondary | ICD-10-CM

## 2014-12-02 DIAGNOSIS — Q828 Other specified congenital malformations of skin: Secondary | ICD-10-CM

## 2014-12-02 DIAGNOSIS — E1140 Type 2 diabetes mellitus with diabetic neuropathy, unspecified: Secondary | ICD-10-CM

## 2014-12-02 LAB — CBC WITH DIFFERENTIAL/PLATELET
BASOS ABS: 0 10*3/uL (ref 0.0–0.1)
BASOS PCT: 0 % (ref 0–1)
Eosinophils Absolute: 0.2 10*3/uL (ref 0.0–0.7)
Eosinophils Relative: 3 % (ref 0–5)
HCT: 41 % (ref 36.0–46.0)
HEMOGLOBIN: 13.2 g/dL (ref 12.0–15.0)
LYMPHS ABS: 1.4 10*3/uL (ref 0.7–4.0)
Lymphocytes Relative: 23 % (ref 12–46)
MCH: 27.4 pg (ref 26.0–34.0)
MCHC: 32.2 g/dL (ref 30.0–36.0)
MCV: 85.1 fL (ref 78.0–100.0)
MPV: 10.4 fL (ref 9.4–12.4)
Monocytes Absolute: 0.6 10*3/uL (ref 0.1–1.0)
Monocytes Relative: 9 % (ref 3–12)
Neutro Abs: 4 10*3/uL (ref 1.7–7.7)
Neutrophils Relative %: 65 % (ref 43–77)
Platelets: 215 10*3/uL (ref 150–400)
RBC: 4.82 MIL/uL (ref 3.87–5.11)
RDW: 14.1 % (ref 11.5–15.5)
WBC: 6.2 10*3/uL (ref 4.0–10.5)

## 2014-12-02 LAB — VITAMIN D 25 HYDROXY (VIT D DEFICIENCY, FRACTURES): VIT D 25 HYDROXY: 35 ng/mL (ref 30–100)

## 2014-12-02 LAB — BASIC METABOLIC PANEL WITH GFR
BUN: 16 mg/dL (ref 6–23)
CO2: 29 mEq/L (ref 19–32)
Calcium: 9.6 mg/dL (ref 8.4–10.5)
Chloride: 102 mEq/L (ref 96–112)
Creat: 0.85 mg/dL (ref 0.50–1.10)
GFR, Est African American: 77 mL/min
GFR, Est Non African American: 67 mL/min
Glucose, Bld: 128 mg/dL — ABNORMAL HIGH (ref 70–99)
Potassium: 4.7 mEq/L (ref 3.5–5.3)
SODIUM: 140 meq/L (ref 135–145)

## 2014-12-02 LAB — TSH: TSH: 1.552 u[IU]/mL (ref 0.350–4.500)

## 2014-12-02 NOTE — Progress Notes (Signed)
   Subjective:    Patient ID: Christine Washington, female    DOB: 01/12/1936, 78 y.o.   MRN: [id number]  HPI Pt presents for nail debridement   Review of Systems     Objective:   Physical Exam: Pulses are palpable bilateral. Nails are thick and dystrophic onychomycotic and painful palpation.          Assessment & Plan:  Assessment: Pain limb secondary to onychomycosis 1 through 5 bilateral.  Plan: Debridement of nails bilateral. 

## 2014-12-15 ENCOUNTER — Telehealth: Payer: Self-pay

## 2014-12-15 NOTE — Telephone Encounter (Signed)
Christine Washington called. She is alarmed by her blood sugar readings. 112 mg/dl, 110 mg/dl, 89 mg/dl and 90 mg/dl. I advised her this is normal blood glucose readings. The range is 70-99. She is happy to know that the readings are normal. She reports being tired and napping during the day. I advised her to come in for an office visit for evaluation. She said she would like to give it a couple of days to see how she feels. I advised her to call back if her symptoms worsen or persist.

## 2014-12-23 DIAGNOSIS — H40013 Open angle with borderline findings, low risk, bilateral: Secondary | ICD-10-CM | POA: Diagnosis not present

## 2014-12-29 ENCOUNTER — Other Ambulatory Visit: Payer: Self-pay | Admitting: Family Medicine

## 2015-01-06 ENCOUNTER — Other Ambulatory Visit: Payer: Self-pay | Admitting: Family Medicine

## 2015-01-20 DIAGNOSIS — H25813 Combined forms of age-related cataract, bilateral: Secondary | ICD-10-CM | POA: Diagnosis not present

## 2015-01-20 DIAGNOSIS — H40003 Preglaucoma, unspecified, bilateral: Secondary | ICD-10-CM | POA: Diagnosis not present

## 2015-01-20 DIAGNOSIS — E119 Type 2 diabetes mellitus without complications: Secondary | ICD-10-CM | POA: Diagnosis not present

## 2015-01-20 DIAGNOSIS — H527 Unspecified disorder of refraction: Secondary | ICD-10-CM | POA: Diagnosis not present

## 2015-01-30 DIAGNOSIS — H25813 Combined forms of age-related cataract, bilateral: Secondary | ICD-10-CM | POA: Diagnosis not present

## 2015-02-05 DIAGNOSIS — K219 Gastro-esophageal reflux disease without esophagitis: Secondary | ICD-10-CM | POA: Diagnosis not present

## 2015-02-05 DIAGNOSIS — Z79899 Other long term (current) drug therapy: Secondary | ICD-10-CM | POA: Diagnosis not present

## 2015-02-05 DIAGNOSIS — Z7982 Long term (current) use of aspirin: Secondary | ICD-10-CM | POA: Diagnosis not present

## 2015-02-05 DIAGNOSIS — F419 Anxiety disorder, unspecified: Secondary | ICD-10-CM | POA: Diagnosis not present

## 2015-02-05 DIAGNOSIS — M1990 Unspecified osteoarthritis, unspecified site: Secondary | ICD-10-CM | POA: Diagnosis not present

## 2015-02-05 DIAGNOSIS — H527 Unspecified disorder of refraction: Secondary | ICD-10-CM | POA: Diagnosis not present

## 2015-02-05 DIAGNOSIS — H25811 Combined forms of age-related cataract, right eye: Secondary | ICD-10-CM | POA: Diagnosis not present

## 2015-02-05 DIAGNOSIS — Z96653 Presence of artificial knee joint, bilateral: Secondary | ICD-10-CM | POA: Diagnosis not present

## 2015-02-05 DIAGNOSIS — E785 Hyperlipidemia, unspecified: Secondary | ICD-10-CM | POA: Diagnosis not present

## 2015-02-05 DIAGNOSIS — G4730 Sleep apnea, unspecified: Secondary | ICD-10-CM | POA: Diagnosis not present

## 2015-02-05 DIAGNOSIS — H40003 Preglaucoma, unspecified, bilateral: Secondary | ICD-10-CM | POA: Diagnosis not present

## 2015-02-05 DIAGNOSIS — H2511 Age-related nuclear cataract, right eye: Secondary | ICD-10-CM | POA: Diagnosis not present

## 2015-02-05 DIAGNOSIS — E119 Type 2 diabetes mellitus without complications: Secondary | ICD-10-CM | POA: Diagnosis not present

## 2015-02-19 DIAGNOSIS — Z7982 Long term (current) use of aspirin: Secondary | ICD-10-CM | POA: Diagnosis not present

## 2015-02-19 DIAGNOSIS — G4730 Sleep apnea, unspecified: Secondary | ICD-10-CM | POA: Diagnosis not present

## 2015-02-19 DIAGNOSIS — K219 Gastro-esophageal reflux disease without esophagitis: Secondary | ICD-10-CM | POA: Diagnosis not present

## 2015-02-19 DIAGNOSIS — H25812 Combined forms of age-related cataract, left eye: Secondary | ICD-10-CM | POA: Diagnosis not present

## 2015-02-19 DIAGNOSIS — H40003 Preglaucoma, unspecified, bilateral: Secondary | ICD-10-CM | POA: Diagnosis not present

## 2015-02-19 DIAGNOSIS — Z888 Allergy status to other drugs, medicaments and biological substances status: Secondary | ICD-10-CM | POA: Diagnosis not present

## 2015-02-19 DIAGNOSIS — H2512 Age-related nuclear cataract, left eye: Secondary | ICD-10-CM | POA: Diagnosis not present

## 2015-02-19 DIAGNOSIS — F419 Anxiety disorder, unspecified: Secondary | ICD-10-CM | POA: Diagnosis not present

## 2015-02-19 DIAGNOSIS — F40240 Claustrophobia: Secondary | ICD-10-CM | POA: Diagnosis not present

## 2015-02-19 DIAGNOSIS — H25813 Combined forms of age-related cataract, bilateral: Secondary | ICD-10-CM | POA: Diagnosis not present

## 2015-02-19 DIAGNOSIS — Z79899 Other long term (current) drug therapy: Secondary | ICD-10-CM | POA: Diagnosis not present

## 2015-02-19 DIAGNOSIS — E119 Type 2 diabetes mellitus without complications: Secondary | ICD-10-CM | POA: Diagnosis not present

## 2015-02-19 DIAGNOSIS — E785 Hyperlipidemia, unspecified: Secondary | ICD-10-CM | POA: Diagnosis not present

## 2015-02-19 DIAGNOSIS — I10 Essential (primary) hypertension: Secondary | ICD-10-CM | POA: Diagnosis not present

## 2015-02-19 DIAGNOSIS — M1990 Unspecified osteoarthritis, unspecified site: Secondary | ICD-10-CM | POA: Diagnosis not present

## 2015-03-02 ENCOUNTER — Encounter: Payer: Self-pay | Admitting: Family Medicine

## 2015-03-02 ENCOUNTER — Ambulatory Visit (INDEPENDENT_AMBULATORY_CARE_PROVIDER_SITE_OTHER): Payer: Medicare Other | Admitting: Family Medicine

## 2015-03-02 ENCOUNTER — Other Ambulatory Visit: Payer: Self-pay | Admitting: Family Medicine

## 2015-03-02 VITALS — BP 118/68 | HR 103 | Wt 184.0 lb

## 2015-03-02 DIAGNOSIS — F322 Major depressive disorder, single episode, severe without psychotic features: Secondary | ICD-10-CM

## 2015-03-02 DIAGNOSIS — Z23 Encounter for immunization: Secondary | ICD-10-CM

## 2015-03-02 DIAGNOSIS — I10 Essential (primary) hypertension: Secondary | ICD-10-CM

## 2015-03-02 DIAGNOSIS — F329 Major depressive disorder, single episode, unspecified: Secondary | ICD-10-CM

## 2015-03-02 DIAGNOSIS — R413 Other amnesia: Secondary | ICD-10-CM | POA: Diagnosis not present

## 2015-03-02 DIAGNOSIS — E119 Type 2 diabetes mellitus without complications: Secondary | ICD-10-CM | POA: Diagnosis not present

## 2015-03-02 LAB — POCT GLYCOSYLATED HEMOGLOBIN (HGB A1C): Hemoglobin A1C: 6.8

## 2015-03-02 MED ORDER — ALPRAZOLAM 0.25 MG PO TABS: 0.2500 mg | ORAL_TABLET | Freq: Every day | ORAL | Status: DC | PRN

## 2015-03-02 MED ORDER — METFORMIN HCL 500 MG PO TABS: 500.0000 mg | ORAL_TABLET | Freq: Two times a day (BID) | ORAL | Status: DC

## 2015-03-02 MED ORDER — DULOXETINE HCL 30 MG PO CPEP: 30.0000 mg | ORAL_CAPSULE | Freq: Every day | ORAL | Status: DC

## 2015-03-02 NOTE — Addendum Note (Signed)
Addended by: Teddy Spike on: 03/02/2015 12:00 PM   Modules accepted: Orders

## 2015-03-02 NOTE — Patient Instructions (Addendum)
Decrease seroquel to half a tab at bedtime for one week, then stop Increase your metformin to twice a day.   For your arthritis - Take 2 extra strength tylenol three times a day ( total of 6 pills per day).

## 2015-03-02 NOTE — Progress Notes (Signed)
   Subjective:    Patient ID: Christine Washington, female    DOB: [birth year], 79 y.o.   MRN: [id number]  HPI Diabetes - no hypoglycemic events. No wounds or sores that are not healing well. No increased thirst or urination. Checking glucose at home. Taking medications as prescribed without any side effects. Her weight is up about 3 lbs since last OV.   Hypertension- Pt denies chest pain, SOB, dizziness, or heart palpitations.  Taking meds as directed w/o problems.  Denies medication side effects.    Bilateral hand and foot pain. Says her fingers are deforming. occ gets swelling in the 5th digitis. Says her wrist swell at times too. She went to a rheumatologist a couple years ago to be evaluated and was told she just had osteoarthritis. No autoimmune type arthritis. She really doesn't take anything for pain.  Her eye doctor asked her why she was taking the Seroquel, Cymbalta and she wasn't really sure. She wanted to clarify with me why she was taking it and if she still needs to continue it. They've these medications were started several years ago around the time that her sister passed away. 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 And she was going through a lot and not sleeping well. She feels like overall her mood is in a good place. She says she occasionally feels down but it doesn't seem to persist.  Review of Systems     Objective:   Physical Exam  Constitutional: She is oriented to person, place, and time. She appears well-developed and well-nourished.  HENT:  Head: Normocephalic and atraumatic.  Cardiovascular: Normal rate, regular rhythm and normal heart sounds.   Harsh 3/6SEM best heard over the left sternal border   Pulmonary/Chest: Effort normal and breath sounds normal.  Musculoskeletal:  She does have some arthritis and deformity of the DIPs and PIPs on both hands.  Neurological: She is alert and oriented to person, place, and time.  Skin: Skin is warm and  dry.  Psychiatric: She has a normal mood and affect. Her behavior is normal.          Assessment & Plan:  DM- Well controled. A1C 6.8 today. Well controlled but up a little bit from previous. We'll have her increase her metformin to twice a day. Follow-up in 3 months.  HTN - well controlled. Continue current regimen.  Depression - Cut the seroquel half daily at night for one week, then stop taking it.  Will dec cymbalta as well to 30mg .   She also has some concerns about her memory. She feels like she steadily been more forgetful and is losing things like her rings in her glasses. Encouraged her to schedule an appointment specifically for memory evaluation.  Discussed Prevnar 13. Given today.

## 2015-03-05 DIAGNOSIS — Z7982 Long term (current) use of aspirin: Secondary | ICD-10-CM | POA: Diagnosis not present

## 2015-03-05 DIAGNOSIS — Z885 Allergy status to narcotic agent status: Secondary | ICD-10-CM | POA: Diagnosis not present

## 2015-03-05 DIAGNOSIS — I10 Essential (primary) hypertension: Secondary | ICD-10-CM | POA: Diagnosis not present

## 2015-03-05 DIAGNOSIS — R4182 Altered mental status, unspecified: Secondary | ICD-10-CM | POA: Diagnosis not present

## 2015-03-05 DIAGNOSIS — M25562 Pain in left knee: Secondary | ICD-10-CM | POA: Diagnosis not present

## 2015-03-05 DIAGNOSIS — Z888 Allergy status to other drugs, medicaments and biological substances status: Secondary | ICD-10-CM | POA: Diagnosis not present

## 2015-03-05 DIAGNOSIS — M1990 Unspecified osteoarthritis, unspecified site: Secondary | ICD-10-CM | POA: Diagnosis not present

## 2015-03-05 DIAGNOSIS — K219 Gastro-esophageal reflux disease without esophagitis: Secondary | ICD-10-CM | POA: Diagnosis not present

## 2015-03-05 DIAGNOSIS — E785 Hyperlipidemia, unspecified: Secondary | ICD-10-CM | POA: Diagnosis not present

## 2015-03-05 DIAGNOSIS — M25561 Pain in right knee: Secondary | ICD-10-CM | POA: Diagnosis not present

## 2015-03-05 DIAGNOSIS — M5032 Other cervical disc degeneration, mid-cervical region: Secondary | ICD-10-CM | POA: Diagnosis not present

## 2015-03-05 DIAGNOSIS — S0003XA Contusion of scalp, initial encounter: Secondary | ICD-10-CM | POA: Diagnosis not present

## 2015-03-05 DIAGNOSIS — M25531 Pain in right wrist: Secondary | ICD-10-CM | POA: Diagnosis not present

## 2015-03-05 DIAGNOSIS — Z79899 Other long term (current) drug therapy: Secondary | ICD-10-CM | POA: Diagnosis not present

## 2015-03-05 DIAGNOSIS — S8001XA Contusion of right knee, initial encounter: Secondary | ICD-10-CM | POA: Diagnosis not present

## 2015-03-05 DIAGNOSIS — M47812 Spondylosis without myelopathy or radiculopathy, cervical region: Secondary | ICD-10-CM | POA: Diagnosis not present

## 2015-03-05 DIAGNOSIS — G4733 Obstructive sleep apnea (adult) (pediatric): Secondary | ICD-10-CM | POA: Diagnosis not present

## 2015-03-05 DIAGNOSIS — W1830XA Fall on same level, unspecified, initial encounter: Secondary | ICD-10-CM | POA: Diagnosis not present

## 2015-03-05 DIAGNOSIS — E119 Type 2 diabetes mellitus without complications: Secondary | ICD-10-CM | POA: Diagnosis not present

## 2015-03-06 ENCOUNTER — Other Ambulatory Visit: Payer: Self-pay | Admitting: Family Medicine

## 2015-03-10 ENCOUNTER — Ambulatory Visit: Payer: Self-pay

## 2015-03-19 ENCOUNTER — Encounter: Payer: Self-pay | Admitting: Family Medicine

## 2015-03-19 ENCOUNTER — Ambulatory Visit (INDEPENDENT_AMBULATORY_CARE_PROVIDER_SITE_OTHER): Payer: Medicare Other | Admitting: Family Medicine

## 2015-03-19 VITALS — BP 142/72 | HR 93 | Wt 185.0 lb

## 2015-03-19 DIAGNOSIS — G4700 Insomnia, unspecified: Secondary | ICD-10-CM

## 2015-03-19 DIAGNOSIS — F329 Major depressive disorder, single episode, unspecified: Secondary | ICD-10-CM

## 2015-03-19 DIAGNOSIS — F32A Depression, unspecified: Secondary | ICD-10-CM

## 2015-03-19 MED ORDER — METFORMIN HCL 1000 MG PO TABS: 1000.0000 mg | ORAL_TABLET | Freq: Every day | ORAL | Status: DC

## 2015-03-19 MED ORDER — TRAZODONE HCL 50 MG PO TABS: 25.0000 mg | ORAL_TABLET | Freq: Every evening | ORAL | Status: DC | PRN

## 2015-03-19 NOTE — Progress Notes (Signed)
   Subjective:    Patient ID: Christine Washington, female    DOB: [birth year], 80 y.o.   MRN: [id number]  HPI  She is here to follow up on her medications. We made several changes at her last visit. We decreased the Seroquel which she was using mostly for mood and insomnia. We were trying to wean her off of this. She says she's not sleeping well and needs something to help her sleep.   Depression-we also decreased her Cymbalta to 30 mg. She was really start on the medication several years ago when her sister passed away and when she came in for her last office visit she wanted to start weaning some of these medications down.  She feels and tripped a few weeks ago and hurt her left knee and bruised her left eye.  Says was really sore for days.     Review of Systems     Objective:   Physical Exam  Constitutional: She is oriented to person, place, and time. She appears well-developed and well-nourished.  HENT:  Head: Normocephalic and atraumatic.  Cardiovascular: Normal rate, regular rhythm and normal heart sounds.   Pulmonary/Chest: Effort normal and breath sounds normal.  Neurological: She is alert and oriented to person, place, and time.  Skin: Skin is warm and dry.  Psychiatric: She has a normal mood and affect. Her behavior is normal.          Assessment & Plan:  Insomnia - discussion a trial of trazodone.  Had tried [place] in the past and says didn't work well at all.   Depression- doing well. No mood changes.  Continue the cymbalta at 30 mg. When I see her back we can continue to wean this medication but ran out 1 to keep a constant while we work on switching her sleep medication regimen.

## 2015-03-21 ENCOUNTER — Other Ambulatory Visit: Payer: Self-pay | Admitting: Family Medicine

## 2015-03-24 ENCOUNTER — Ambulatory Visit: Payer: Self-pay

## 2015-03-26 ENCOUNTER — Other Ambulatory Visit: Payer: Self-pay | Admitting: Family Medicine

## 2015-04-01 ENCOUNTER — Other Ambulatory Visit: Payer: Self-pay | Admitting: Family Medicine

## 2015-04-01 MED ORDER — LOSARTAN POTASSIUM-HCTZ 100-12.5 MG PO TABS: 1.0000 | ORAL_TABLET | Freq: Every day | ORAL | Status: DC

## 2015-04-03 ENCOUNTER — Ambulatory Visit: Payer: Medicare Other | Admitting: Family Medicine

## 2015-04-07 ENCOUNTER — Encounter: Payer: Medicare Other | Admitting: Family Medicine

## 2015-04-07 DIAGNOSIS — Z961 Presence of intraocular lens: Secondary | ICD-10-CM | POA: Diagnosis not present

## 2015-04-09 ENCOUNTER — Encounter: Payer: Medicare Other | Admitting: Family Medicine

## 2015-04-10 ENCOUNTER — Ambulatory Visit (INDEPENDENT_AMBULATORY_CARE_PROVIDER_SITE_OTHER): Payer: Medicare Other

## 2015-04-10 ENCOUNTER — Ambulatory Visit: Payer: Self-pay | Admitting: Podiatry

## 2015-04-10 DIAGNOSIS — B351 Tinea unguium: Secondary | ICD-10-CM | POA: Diagnosis not present

## 2015-04-10 DIAGNOSIS — E1140 Type 2 diabetes mellitus with diabetic neuropathy, unspecified: Secondary | ICD-10-CM | POA: Diagnosis not present

## 2015-04-10 DIAGNOSIS — M79673 Pain in unspecified foot: Secondary | ICD-10-CM | POA: Diagnosis not present

## 2015-04-10 NOTE — Progress Notes (Signed)
   Subjective:    Patient ID: Christine Washington, female    DOB: August 30, 1936, 79 y.o.   MRN: [id number]  HPI Pt presents for nail debridement   Review of Systems     Objective:   Physical Exam: Pulses are palpable bilateral. Nails are thick and dystrophic onychomycotic and painful palpation.          Assessment & Plan:  Assessment: Pain limb secondary to onychomycosis 1 through 5 bilateral.  Plan: Debridement of nails bilateral.

## 2015-04-14 ENCOUNTER — Ambulatory Visit: Payer: Self-pay

## 2015-04-15 ENCOUNTER — Ambulatory Visit (INDEPENDENT_AMBULATORY_CARE_PROVIDER_SITE_OTHER): Payer: Medicare Other

## 2015-04-15 ENCOUNTER — Telehealth: Payer: Self-pay | Admitting: *Deleted

## 2015-04-15 ENCOUNTER — Ambulatory Visit (INDEPENDENT_AMBULATORY_CARE_PROVIDER_SITE_OTHER): Payer: Medicare Other | Admitting: Family Medicine

## 2015-04-15 ENCOUNTER — Encounter: Payer: Self-pay | Admitting: Family Medicine

## 2015-04-15 VITALS — BP 127/70 | HR 88 | Ht 66.0 in | Wt 186.0 lb

## 2015-04-15 DIAGNOSIS — R7989 Other specified abnormal findings of blood chemistry: Secondary | ICD-10-CM | POA: Diagnosis not present

## 2015-04-15 DIAGNOSIS — Z0000 Encounter for general adult medical examination without abnormal findings: Secondary | ICD-10-CM | POA: Diagnosis not present

## 2015-04-15 DIAGNOSIS — E559 Vitamin D deficiency, unspecified: Secondary | ICD-10-CM | POA: Diagnosis not present

## 2015-04-15 DIAGNOSIS — R0602 Shortness of breath: Secondary | ICD-10-CM

## 2015-04-15 DIAGNOSIS — E119 Type 2 diabetes mellitus without complications: Secondary | ICD-10-CM | POA: Diagnosis not present

## 2015-04-15 DIAGNOSIS — R799 Abnormal finding of blood chemistry, unspecified: Secondary | ICD-10-CM | POA: Diagnosis not present

## 2015-04-15 DIAGNOSIS — J9811 Atelectasis: Secondary | ICD-10-CM

## 2015-04-15 DIAGNOSIS — Z780 Asymptomatic menopausal state: Secondary | ICD-10-CM

## 2015-04-15 DIAGNOSIS — E785 Hyperlipidemia, unspecified: Secondary | ICD-10-CM

## 2015-04-15 LAB — LIPID PANEL
CHOLESTEROL: 153 mg/dL (ref 0–200)
HDL: 47 mg/dL (ref >=46)
LDL Cholesterol: 79 mg/dL (ref 0–99)
Total CHOL/HDL Ratio: 3.3 Ratio
Triglycerides: 134 mg/dL (ref <150)
VLDL: 27 mg/dL (ref 0–40)

## 2015-04-15 LAB — COMPLETE METABOLIC PANEL WITH GFR
ALK PHOS: 65 U/L (ref 39–117)
ALT: 12 U/L (ref 0–35)
AST: 20 U/L (ref 0–37)
Albumin: 4.5 g/dL (ref 3.5–5.2)
BILIRUBIN TOTAL: 0.5 mg/dL (ref 0.2–1.2)
BUN: 21 mg/dL (ref 6–23)
CO2: 25 mEq/L (ref 19–32)
Calcium: 10 mg/dL (ref 8.4–10.5)
Chloride: 105 mEq/L (ref 96–112)
Creat: 0.76 mg/dL (ref 0.50–1.10)
GFR, Est African American: 88 mL/min
GFR, Est Non African American: 76 mL/min
GLUCOSE: 104 mg/dL — AB (ref 70–99)
Potassium: 4.3 mEq/L (ref 3.5–5.3)
SODIUM: 139 meq/L (ref 135–145)
Total Protein: 7.3 g/dL (ref 6.0–8.3)

## 2015-04-15 LAB — TSH: TSH: 1.483 u[IU]/mL (ref 0.350–4.500)

## 2015-04-15 NOTE — Patient Instructions (Signed)
Keep up a regular exercise program and make sure you are eating a healthy diet Try to eat 4 servings of dairy a day, or if you are lactose intolerant take a calcium with vitamin D daily.  Your vaccines are up to date.   

## 2015-04-15 NOTE — Telephone Encounter (Signed)
Before leaving office today pt stated that she had forgotten to let Dr.Metheney know that she had been feeling dizzy lately. She said that she could schedule a f/u up appt for this. Fwd to pcp for advice.Audelia Hives Ryan

## 2015-04-15 NOTE — Progress Notes (Signed)
Subjective:    Christine Washington is a 79 y.o. female who presents for Medicare Annual/Subsequent preventive examination.  Preventive Screening-Counseling & Management  Tobacco History  Smoking status  . Never Smoker   Smokeless tobacco  . Never Used     Problems Prior to Visit 1. Says waking up feeling SOb at night. Started a couple of months ago. She has CPAP.  She is not using it. Has felt dizzy a few times as well as her she when she first gets up. She also has a history of a hiatal hernia. Shortness of breath doesn't seem to occur during the day. No worsening or alleviating symptoms. No known triggers.  Current Problems (verified) Patient Active Problem List   Diagnosis Date Noted  . Cataract 09/05/2013  . Delayed sleep phase syndrome 11/09/2012  . Osteopenia 09/27/2012  . OSA (obstructive sleep apnea) 04/03/2012  . Diabetic neuropathy 12/28/2011  . FATIGUE 04/12/2010  . Diabetes type 2, controlled 06/02/2009  . SCIATICA 04/22/2009  . GASTRIC POLYP 03/18/2008  . Hyperlipidemia 01/17/2008  . THYROID NODULE 11/22/2007  . GERD 09/24/2007  . OSTEOARTHRITIS, KNEE 09/24/2007  . [place] DISEASE, CERVICAL 09/24/2007  . Major depression, chronic 07/16/2007  . COMMON MIGRAINE 07/11/2007  . HYPERTENSION, BENIGN ESSENTIAL 07/05/2007    Medications Prior to Visit Current Outpatient Prescriptions on File Prior to Visit  Medication Sig Dispense Refill  . alendronate (FOSAMAX) 70 MG tablet TAKE 1 TABLET BY MOUTH EVERY 7 DAYS WITH A FULL GLASS OF WATER ON AN EMPTY STOMACH 4 tablet 11  . ALPRAZolam (XANAX) 0.25 MG tablet Take 1 tablet (0.25 mg total) by mouth daily as needed for anxiety. 60 tablet 0  . amLODipine (NORVASC) 5 MG tablet TAKE 1 TABLET (5 MG TOTAL) BY MOUTH DAILY. 90 tablet 0  . aspirin 81 MG tablet Take 81 mg by mouth daily.      . Cholecalciferol (VITAMIN D3) 1000 UNITS CAPS Take 2 tablets by mouth.    . DULoxetine (CYMBALTA) 30 MG capsule Take 1 capsule (30 mg total) by  mouth daily. 30 capsule 2  . losartan-hydrochlorothiazide (HYZAAR) 100-12.5 MG per tablet Take 1 tablet by mouth daily. 90 tablet 0  . Magnesium 250 MG TABS Take 1 tablet by mouth.    . metFORMIN (GLUCOPHAGE) 1000 MG tablet Take 1 tablet (1,000 mg total) by mouth daily with breakfast. 90 tablet 2  . omeprazole (PRILOSEC) 40 MG capsule TAKE ONE CAPSULE BY MOUTH EVERY DAY 30 capsule 1  . pravastatin (PRAVACHOL) 80 MG tablet TAKE 1 TABLET BY MOUTH EVERY EVENING 90 tablet 2  . traZODone (DESYREL) 50 MG tablet Take 0.5-4 tablets (25-200 mg total) by mouth at bedtime as needed for sleep. 30 tablet 3   No current facility-administered medications on file prior to visit.    Current Medications (verified) Current Outpatient Prescriptions  Medication Sig Dispense Refill  . alendronate (FOSAMAX) 70 MG tablet TAKE 1 TABLET BY MOUTH EVERY 7 DAYS WITH A FULL GLASS OF WATER ON AN EMPTY STOMACH 4 tablet 11  . ALPRAZolam (XANAX) 0.25 MG tablet Take 1 tablet (0.25 mg total) by mouth daily as needed for anxiety. 60 tablet 0  . amLODipine (NORVASC) 5 MG tablet TAKE 1 TABLET (5 MG TOTAL) BY MOUTH DAILY. 90 tablet 0  . aspirin 81 MG tablet Take 81 mg by mouth daily.      . Cholecalciferol (VITAMIN D3) 1000 UNITS CAPS Take 2 tablets by mouth.    . DULoxetine (CYMBALTA) 30 MG capsule  Take 1 capsule (30 mg total) by mouth daily. 30 capsule 2  . losartan-hydrochlorothiazide (HYZAAR) 100-12.5 MG per tablet Take 1 tablet by mouth daily. 90 tablet 0  . Magnesium 250 MG TABS Take 1 tablet by mouth.    . metFORMIN (GLUCOPHAGE) 1000 MG tablet Take 1 tablet (1,000 mg total) by mouth daily with breakfast. 90 tablet 2  . omeprazole (PRILOSEC) 40 MG capsule TAKE ONE CAPSULE BY MOUTH EVERY DAY 30 capsule 1  . pravastatin (PRAVACHOL) 80 MG tablet TAKE 1 TABLET BY MOUTH EVERY EVENING 90 tablet 2  . traZODone (DESYREL) 50 MG tablet Take 0.5-4 tablets (25-200 mg total) by mouth at bedtime as needed for sleep. 30 tablet 3   No  current facility-administered medications for this visit.     Allergies (verified) Ace inhibitors; Bupropion hcl; Lipitor; and Pioglitazone   PAST HISTORY  Family History Family History  Problem Relation Age of Onset  . Hypertension Mother   . Stroke Mother   . Diabetes Mother   . Stroke Father 61  . Hypertension Father   . Cancer Father     melanoma  . Multiple sclerosis Sister     died  . COPD Sister   . Colon cancer Neg Hx     Social History History  Substance Use Topics  . Smoking status: Never Smoker   . Smokeless tobacco: Never Used  . Alcohol Use: No     Are there smokers in your home (other than you)? No  Risk Factors Current exercise habits: The patient does not participate in regular exercise at present.  Dietary issues discussed: None  Cardiac risk factors: advanced age (older than 67 for men, 57 for women), diabetes mellitus, dyslipidemia and hypertension.  Depression Screen (Note: if answer to either of the following is "Yes", a more complete depression screening is indicated)   Over the past two weeks, have you felt down, depressed or hopeless? No  Over the past two weeks, have you felt little interest or pleasure in doing things? No  Have you lost interest or pleasure in daily life? No  Do you often feel hopeless? No  Do you cry easily over simple problems? No  Activities of Daily Living In your present state of health, do you have any difficulty performing the following activities?:  Driving? No Managing money?  No Feeding yourself? No Getting from bed to chair? Yes Climbing a flight of stairs? Yes Preparing food and eating?: No Bathing or showering? No Getting dressed: No Getting to the toilet? No Using the toilet:No Moving around from place to place: No In the past year have you fallen or had a near fall?:No   Are you sexually active?  No  Do you have more than one partner?  No  Hearing Difficulties: Yes Do you often ask people to  speak up or repeat themselves? Yes Do you experience ringing or noises in your ears? No Do you have difficulty understanding soft or whispered voices? Yes   Do you feel that you have a problem with memory? Yes  Do you often misplace items? Yes  Do you feel safe at home?  Yes  Cognitive Testing  Alert? Yes  Normal Appearance?Yes  Oriented to person? Yes  Place? Yes   Time? Yes  Recall of three objects?  Yes  Can perform simple calculations? Yes  Displays appropriate judgment?Yes  Can read the correct time from a watch face?Yes   cit-6 NORMAL   Advanced Directives have been discussed  with the patient? No  List the Names of Other Physician/Practitioners you currently use: 1.    Indicate any recent Medical Services you may have received from other than [facility] providers in the past year (date may be approximate).  Immunization History  Administered Date(s) Administered  . Influenza Split 09/10/2012  . Influenza Whole 09/24/2007, 09/04/2008, 11/06/2009, 10/13/2010  . Influenza,inj,Quad PF,36+ Mos 11/01/2013, 08/22/2014  . Pneumococcal Conjugate-13 03/02/2015  . Pneumococcal Polysaccharide-23 12/19/2004  . Tdap 12/28/2011  . Zoster 02/17/2012    Screening Tests Health Maintenance  Topic Date Due  . FOOT EXAM  05/24/2015  . INFLUENZA VACCINE  07/20/2015  . URINE MICROALBUMIN  08/23/2015  . HEMOGLOBIN A1C  09/02/2015  . OPHTHALMOLOGY EXAM  09/04/2015  . COLONOSCOPY  12/19/2018  . TETANUS/TDAP  12/27/2021  . DEXA SCAN  Completed  . ZOSTAVAX  Completed  . PNA vac Low Risk Adult  Completed    All answers were reviewed with the patient and necessary referrals were made:  METHENEY,CATHERINE, MD   04/15/2015   History reviewed: allergies, current medications, past family history, past medical history, past social history, past surgical history and problem list  Review of Systems A comprehensive review of systems was negative.    Objective:     Vision by Snellen chart:  UTD Body mass index is 30.04 kg/(m^2). BP 127/70 mmHg  Pulse 88  Ht 5\' 6"  (1.676 m)  Wt 186 lb (84.369 kg)  BMI 30.04 kg/m2  BP 127/70 mmHg  Pulse 88  Ht 5\' 6"  (1.676 m)  Wt 186 lb (84.369 kg)  BMI 30.04 kg/m2 General appearance: alert, cooperative and appears stated age Head: Normocephalic, without obvious abnormality, atraumatic Eyes: conj claer, EOM, PEERLA Ears: normal TM's and external ear canals both ears Nose: Nares normal. Septum midline. Mucosa normal. No drainage or sinus tenderness. Throat: lips, mucosa, and tongue normal; teeth and gums normal Neck: no adenopathy, no carotid bruit, no JVD, supple, symmetrical, trachea midline and thyroid not enlarged, symmetric, no tenderness/mass/nodules Back: symmetric, no curvature. ROM normal. No CVA tenderness. Lungs: clear to auscultation bilaterally Heart: regular rate and rhythm, S1, S2 normal, no murmur, click, rub or gallop Abdomen: soft, non-tender; bowel sounds normal; no masses,  no organomegaly Extremities: extremities normal, atraumatic, no cyanosis or edema Pulses: 2+ and symmetric Skin: Skin color, texture, turgor normal. No rashes or lesions Lymph nodes: Cervical, supraclavicular, and axillary nodes normal. Neurologic: Alert and oriented X 3, normal strength and tone. Normal symmetric reflexes. Normal coordination and gait     Assessment:     Annual Medicare Wellness Exam       Plan:     During the course of the visit the patient was educated and counseled about appropriate screening and preventive services including:    vaccines are UTD   DEXA  Shortness of breath-we'll get chest x-ray today. Certainly could be the higher hernia sliding up and her chest making her feel short of breath. She says what happens it's pretty extreme. I did encourage her to start wearing her CPAP again since she has known sleep apnea. Consider spirometry for further evaluation as well.  Diet review for nutrition referral? Yes  ____  Not Indicated _X__   Patient Instructions (the written plan) was given to the patient.  Medicare Attestation I have personally reviewed: The patient's medical and social history Their use of alcohol, tobacco or illicit drugs Their current medications and supplements The patient's functional ability including ADLs,fall risks, home safety risks, cognitive, and  hearing and visual impairment Diet and physical activities Evidence for depression or mood disorders  The patient's weight, height, BMI, and visual acuity have been recorded in the chart.  I have made referrals, counseling, and provided education to the patient based on review of the above and I have provided the patient with a written personalized care plan for preventive services.     METHENEY,CATHERINE, MD   04/15/2015

## 2015-04-16 ENCOUNTER — Encounter: Payer: Medicare Other | Admitting: Family Medicine

## 2015-04-16 LAB — VITAMIN D 25 HYDROXY (VIT D DEFICIENCY, FRACTURES): VIT D 25 HYDROXY: 36 ng/mL (ref 30–100)

## 2015-04-20 ENCOUNTER — Telehealth: Payer: Self-pay | Admitting: *Deleted

## 2015-04-20 MED ORDER — GLUCOSE BLOOD VI STRP: ORAL_STRIP | Status: DC

## 2015-04-20 NOTE — Telephone Encounter (Signed)
Pt called and wanted Dr. Madilyn Fireman to know that her feet and legs have been swelling since being on the trazodone, pt stated that this began to happen since being on trazodone.

## 2015-04-27 ENCOUNTER — Other Ambulatory Visit: Payer: Self-pay | Admitting: Family Medicine

## 2015-04-27 DIAGNOSIS — Z139 Encounter for screening, unspecified: Secondary | ICD-10-CM

## 2015-04-29 ENCOUNTER — Telehealth: Payer: Self-pay | Admitting: Family Medicine

## 2015-04-29 NOTE — Telephone Encounter (Signed)
(  sorry, accidentally closed the note) She said on some nights she does take 4 tabs of trazadone but she's up and down all night going to the bathroom and she said that they just "make her feel bad".

## 2015-04-29 NOTE — Telephone Encounter (Signed)
Informed pt to start the 2 tabs of Cymbalta

## 2015-04-29 NOTE — Telephone Encounter (Signed)
Have her increase the Cymbalta to 2 tabs. Also see how many tabs at the trazodone she is taking at bedtime. If she is already Taking 4 tabs then please let me know.

## 2015-04-29 NOTE — Telephone Encounter (Signed)
Spoke to pt and she said that sat afternoon she just burst into tears and has done so every day since.  She said that she doesn't sleep that well on the trazadone and that she's had to take a xanax every day since sat.  I told her you could possibly increase her cymbalta.  She already has an appt with you next thurs I believe.

## 2015-04-29 NOTE — Telephone Encounter (Signed)
Pt called. She is having uncontrollable crying spells.

## 2015-04-30 MED ORDER — ESZOPICLONE 2 MG PO TABS: 2.0000 mg | ORAL_TABLET | Freq: Every evening | ORAL | Status: DC | PRN

## 2015-04-30 NOTE — Addendum Note (Signed)
Addended by: Beatrice Lecher D on: 04/30/2015 12:37 PM   Modules accepted: Orders

## 2015-04-30 NOTE — Telephone Encounter (Signed)
New rx sent

## 2015-04-30 NOTE — Telephone Encounter (Signed)
Pt stated that she doesn't think she's ever tried [place] before but is willing to give it a go.

## 2015-04-30 NOTE — Telephone Encounter (Signed)
We an try switching to lunesta for sleep.  Has she tried tthat before.

## 2015-05-05 ENCOUNTER — Telehealth: Payer: Self-pay | Admitting: Family Medicine

## 2015-05-05 ENCOUNTER — Other Ambulatory Visit: Payer: Self-pay | Admitting: Family Medicine

## 2015-05-05 MED ORDER — TRAZODONE HCL 100 MG PO TABS: 100.0000 mg | ORAL_TABLET | Freq: Every day | ORAL | Status: DC

## 2015-05-05 NOTE — Telephone Encounter (Signed)
Patient's insurance will not cover lunesta so she is requesting a refill on her trazadone.  She is taking her last dose tonight.  Thanks!

## 2015-05-05 NOTE — Telephone Encounter (Signed)
Call pt and let her know rx sent.

## 2015-05-06 ENCOUNTER — Other Ambulatory Visit: Payer: Medicare Other

## 2015-05-06 NOTE — Telephone Encounter (Signed)
  Patient advised that rx was sent to pharmacy. Rhonda Cunningham,CMA

## 2015-05-07 ENCOUNTER — Encounter: Payer: Self-pay | Admitting: Family Medicine

## 2015-05-07 ENCOUNTER — Ambulatory Visit (INDEPENDENT_AMBULATORY_CARE_PROVIDER_SITE_OTHER): Payer: Medicare Other | Admitting: Family Medicine

## 2015-05-07 VITALS — BP 143/76 | HR 91 | Wt 185.0 lb

## 2015-05-07 DIAGNOSIS — R1032 Left lower quadrant pain: Secondary | ICD-10-CM | POA: Diagnosis not present

## 2015-05-07 DIAGNOSIS — R10814 Left lower quadrant abdominal tenderness: Secondary | ICD-10-CM | POA: Diagnosis not present

## 2015-05-07 DIAGNOSIS — M1990 Unspecified osteoarthritis, unspecified site: Secondary | ICD-10-CM

## 2015-05-07 DIAGNOSIS — R140 Abdominal distension (gaseous): Secondary | ICD-10-CM | POA: Diagnosis not present

## 2015-05-07 DIAGNOSIS — G4700 Insomnia, unspecified: Secondary | ICD-10-CM

## 2015-05-07 DIAGNOSIS — R198 Other specified symptoms and signs involving the digestive system and abdomen: Secondary | ICD-10-CM | POA: Diagnosis not present

## 2015-05-07 NOTE — Patient Instructions (Signed)
Find a probiotic over the counter Try for at least 3-4 weeks and see if you feel your bowels are better.

## 2015-05-07 NOTE — Progress Notes (Signed)
   Subjective:    Patient ID: Christine Washington, female    DOB: 1936-10-05, 79 y.o.   MRN: [id number]  HPI Dizziness  For 3-4 weeks. Feels like her balance has been off.  We did increase her cymbalta 60mg  around 8 days ago ago.  She has had bilat knee replacement and the has fallen on her knees several times and feels like they "not right"  . She just felt like she was really tearful.   Insomnia - The lunesta was not covered well. So went back to the trazodone.  Has been using 200mg .  she sleeps for about 6 hours on it still wakes up frequently.  OA - uses extra strength PRN but it constipates her so doesn use often.  Then has to use a laxative.    Bloating - says has a hiatal hernia. Says has had pain in the LLQ. Says she can't even vacuum.  No fever, chills, or sweats.   Review of Systems     Objective:   Physical Exam  Constitutional: She is oriented to person, place, and time. She appears well-developed and well-nourished.  HENT:  Head: Normocephalic and atraumatic.  Cardiovascular: Normal rate, regular rhythm and normal heart sounds.   Pulmonary/Chest: Effort normal and breath sounds normal.  Abdominal: Soft. Bowel sounds are normal. She exhibits no distension and no mass. There is tenderness. There is guarding.  Neurological: She is alert and oriented to person, place, and time.  Skin: Skin is warm and dry.  Psychiatric: She has a normal mood and affect. Her behavior is normal.          Assessment & Plan:  Bloating - avoid dairy, discussed lactose free diet.  Recommend trial of probiotic.   Depression - dong well on the inc dose of Cymbalta. Says hasn't had to use her xanax in one week.   OA- can use a stool softener with her tyelnol to prevent the constipation.   LLQ pain w/ guarding - will do CT to rule out diverticulitis.  Will schedule at [facility]  [facility] within last 6 weeks.  Go to ED if suddenly gets worse.    Insomnia- back on the trazodone and she has not had any  lower extremity swelling since been back on it. The swelling seems to have resolved on its own. She's not sure why it happened. Can increase the trazodone to 3 mg if needed.

## 2015-05-11 ENCOUNTER — Encounter (HOSPITAL_BASED_OUTPATIENT_CLINIC_OR_DEPARTMENT_OTHER): Payer: Self-pay

## 2015-05-11 ENCOUNTER — Ambulatory Visit (HOSPITAL_BASED_OUTPATIENT_CLINIC_OR_DEPARTMENT_OTHER)
Admission: RE | Admit: 2015-05-11 | Discharge: 2015-05-11 | Disposition: A | Discharge status: Home / Self Care | Payer: Medicare Other | Source: Ambulatory Visit | Attending: Family Medicine | Admitting: Family Medicine

## 2015-05-11 DIAGNOSIS — K5730 Diverticulosis of large intestine without perforation or abscess without bleeding: Secondary | ICD-10-CM | POA: Diagnosis not present

## 2015-05-11 DIAGNOSIS — R10814 Left lower quadrant abdominal tenderness: Secondary | ICD-10-CM

## 2015-05-11 DIAGNOSIS — R198 Other specified symptoms and signs involving the digestive system and abdomen: Secondary | ICD-10-CM

## 2015-05-11 DIAGNOSIS — N281 Cyst of kidney, acquired: Secondary | ICD-10-CM | POA: Diagnosis not present

## 2015-05-11 DIAGNOSIS — R1032 Left lower quadrant pain: Secondary | ICD-10-CM | POA: Insufficient documentation

## 2015-05-11 MED ORDER — IOHEXOL 300 MG/ML  SOLN
100.0000 mL | Freq: Once | INTRAMUSCULAR | Status: AC | PRN
  Administered 2015-05-11: 100 mL via INTRAVENOUS

## 2015-05-13 ENCOUNTER — Ambulatory Visit: Payer: Medicare Other

## 2015-05-13 ENCOUNTER — Other Ambulatory Visit: Payer: Medicare Other

## 2015-05-14 ENCOUNTER — Other Ambulatory Visit: Payer: Self-pay | Admitting: Family Medicine

## 2015-05-20 ENCOUNTER — Other Ambulatory Visit: Payer: Medicare Other

## 2015-05-20 ENCOUNTER — Ambulatory Visit: Payer: Medicare Other

## 2015-05-24 ENCOUNTER — Other Ambulatory Visit: Payer: Self-pay | Admitting: Family Medicine

## 2015-05-25 ENCOUNTER — Ambulatory Visit (INDEPENDENT_AMBULATORY_CARE_PROVIDER_SITE_OTHER): Payer: Medicare Other | Admitting: Sports Medicine

## 2015-05-25 ENCOUNTER — Ambulatory Visit (INDEPENDENT_AMBULATORY_CARE_PROVIDER_SITE_OTHER): Payer: Medicare Other

## 2015-05-25 ENCOUNTER — Encounter: Payer: Self-pay | Admitting: Sports Medicine

## 2015-05-25 VITALS — BP 121/69 | HR 95 | Ht 66.0 in | Wt 184.0 lb

## 2015-05-25 DIAGNOSIS — M25562 Pain in left knee: Secondary | ICD-10-CM | POA: Diagnosis not present

## 2015-05-25 DIAGNOSIS — Z96653 Presence of artificial knee joint, bilateral: Secondary | ICD-10-CM | POA: Diagnosis not present

## 2015-05-25 DIAGNOSIS — M25561 Pain in right knee: Secondary | ICD-10-CM | POA: Diagnosis not present

## 2015-05-25 DIAGNOSIS — M47816 Spondylosis without myelopathy or radiculopathy, lumbar region: Secondary | ICD-10-CM | POA: Diagnosis not present

## 2015-05-25 DIAGNOSIS — Z96652 Presence of left artificial knee joint: Secondary | ICD-10-CM | POA: Diagnosis not present

## 2015-05-25 DIAGNOSIS — Z96651 Presence of right artificial knee joint: Secondary | ICD-10-CM | POA: Diagnosis not present

## 2015-05-25 MED ORDER — MELOXICAM 15 MG PO TABS: ORAL_TABLET | ORAL | Status: DC

## 2015-05-25 NOTE — Assessment & Plan Note (Addendum)
Question loosening of the prosthesis, right worse than left. X-rays, bone scan if no better. She does have some tenderness at the pes anserine bursa which can be a target for injection if we determine there is no loosening. I would like her to do some physical therapy with her knees as well.

## 2015-05-25 NOTE — Progress Notes (Signed)
   Subjective:    I'm seeing this patient as a consultation for:  Dr. Beatrice Lecher  CC: Multiple issues  HPI: Back pain: Patient has a lump on the left side of her low back, wonders if this is the cause of her pain. She does have a history of chronic low back pain, and an MRI done in the past showing multilevel spondylosis and degenerative disc disease. Pain is predominantly axial, worse with most motions, radiating into the thigh and buttock but not past the knee. No bowel or bladder dysfunction, saddle numbness, or constitutional symptoms.  Bilateral knee pain: Is post bilateral knee arthroplasty approximately 10 years ago per patient, she has had multiple falls and now recurrence of pain that she localizes at the medial joint line as well as just distal to the joint line, moderate, persistent.  Past medical history, Surgical history, Family history not pertinant except as noted below, Social history, Allergies, and medications have been entered into the medical record, reviewed, and no changes needed.   Review of Systems: No headache, visual changes, nausea, vomiting, diarrhea, constipation, dizziness, abdominal pain, skin rash, fevers, chills, night sweats, weight loss, swollen lymph nodes, body aches, joint swelling, muscle aches, chest pain, shortness of breath, mood changes, visual or auditory hallucinations.   Objective:   General: Well Developed, well nourished, and in no acute distress.  Neuro/Psych: Alert and oriented x3, extra-ocular muscles intact, able to move all 4 extremities, sensation grossly intact. Skin: Warm and dry, no rashes noted.  Respiratory: Not using accessory muscles, speaking in full sentences, trachea midline.  Cardiovascular: Pulses palpable, no extremity edema. Abdomen: Does not appear distended. Back Exam:  Inspection: Unremarkable  Motion: Flexion 45 deg, Extension 45 deg, Side Bending to 45 deg bilaterally,  Rotation to 45 deg bilaterally  SLR  laying: Negative  XSLR laying: Negative  Palpable tenderness: There are some palpable deposits of fact, however pain is referable to the paraspinal muscles bilaterally, and somewhat diffusely.Marland Kitchen FABER: negative. Sensory change: Gross sensation intact to all lumbar and sacral dermatomes.  Reflexes: 2+ at both patellar tendons, 2+ at achilles tendons, Babinski's downgoing.  Strength at foot  Plantar-flexion: 5/5 Dorsi-flexion: 5/5 Eversion: 5/5 Inversion: 5/5  Leg strength  Quad: 5/5 Hamstring: 5/5 Hip flexor: 5/5 Hip abductors: 5/5  Gait unremarkable. Bilateral Knee: Normal to inspection, well healed arthroplasty scars bilaterally, tender to palpation at the medial joint line as well as over the proximal medial tibia. ROM normal in flexion and extension and lower leg rotation. Non painful patellar compression. Patellar and quadriceps tendons unremarkable. Hamstring and quadriceps strength is normal.  Impression and Recommendations:   This case required medical decision making of moderate complexity.

## 2015-05-25 NOTE — Assessment & Plan Note (Signed)
MRI from 2010 did show multilevel spondylosis with degenerative disc disease and facet arthritis. Pain seems to be predominantly facet mediated. No radicular symptoms. Formal physical therapy, meloxicam, return in one month, MRI for interventional if no better.

## 2015-05-27 ENCOUNTER — Encounter: Payer: Self-pay | Admitting: Physical Therapy

## 2015-05-27 ENCOUNTER — Ambulatory Visit (INDEPENDENT_AMBULATORY_CARE_PROVIDER_SITE_OTHER): Payer: Medicare Other | Admitting: Physical Therapy

## 2015-05-27 ENCOUNTER — Ambulatory Visit (INDEPENDENT_AMBULATORY_CARE_PROVIDER_SITE_OTHER): Payer: Medicare Other

## 2015-05-27 ENCOUNTER — Ambulatory Visit: Payer: Medicare Other

## 2015-05-27 DIAGNOSIS — M85851 Other specified disorders of bone density and structure, right thigh: Secondary | ICD-10-CM

## 2015-05-27 DIAGNOSIS — R52 Pain, unspecified: Secondary | ICD-10-CM | POA: Diagnosis not present

## 2015-05-27 DIAGNOSIS — M8580 Other specified disorders of bone density and structure, unspecified site: Secondary | ICD-10-CM | POA: Diagnosis not present

## 2015-05-27 DIAGNOSIS — M25659 Stiffness of unspecified hip, not elsewhere classified: Secondary | ICD-10-CM

## 2015-05-27 DIAGNOSIS — M6281 Muscle weakness (generalized): Secondary | ICD-10-CM | POA: Diagnosis not present

## 2015-05-27 DIAGNOSIS — Z780 Asymptomatic menopausal state: Secondary | ICD-10-CM

## 2015-05-27 DIAGNOSIS — Z13820 Encounter for screening for osteoporosis: Secondary | ICD-10-CM | POA: Diagnosis not present

## 2015-05-27 DIAGNOSIS — M859 Disorder of bone density and structure, unspecified: Secondary | ICD-10-CM | POA: Diagnosis not present

## 2015-05-27 NOTE — Therapy (Addendum)
[place], [place], [address] Phone: [phone]   Fax:  [phone]  Physical Therapy Evaluation  Patient Details  Name: Christine Washington MRN: [id number] Date of Birth: 12-Feb-1936 Referring Provider:  Silverio Decamp,*  Encounter Date: 05/27/2015      PT End of Session - 05/27/15 0936    Visit Number 1   Number of Visits 12   Date for PT Re-Evaluation 07/08/15   Authorization - Visit Number --   PT Start Time 0936   PT Stop Time 1037   PT Time Calculation (min) 61 min      Past Medical History  Diagnosis Date  . Hypertension   . Hyperlipidemia   . OA (osteoarthritis) of knee     left- Dr Latanya Maudlin  . Depression   . GERD (gastroesophageal reflux disease)     Dr Olevia Perches  . Diverticulosis   . Anxiety   . Cervical disc disease   . Diabetic neuropathy   . Type 2 diabetes mellitus   . H/O hiatal hernia   . Carpal tunnel syndrome on left   . OSA on CPAP   . History of wrist fracture     LEFT --  09-02-2013/   RESIDUAL DISCOMFORT  . Wears glasses   . History of uterine fibroid     Past Surgical History  Procedure Laterality Date  . Nasal sinus surgery  1990  . Breast reduction surgery  1995  . Total knee arthroplasty Bilateral RIGHT  10-19-2004/   LEFT  10-31-2007  . Knee arthroscopy w/ meniscectomy Left 01-03-2006  . Cardiovascular stress test  12-21-2007    NORMAL NUCLEAR STUDY/  NO ISCHEMIA/  EF 76%  . Transthoracic echocardiogram  12-21-2007    MILD TO MODERATE LAE/  EF 55%/  MILD AV CALCIFICATION WITH NO STENOSIS  . Abdominal hysterectomy  1970's    AND APPENDECTOMY  . Exploratory laparotomy/  lysis adhesions/  bilateral salpingoophorectomy  1980's  . Tonsillectomy  AGE 63  . Carpal tunnel release Left 04/03/2014    Procedure: LEFT CARPAL TUNNEL RELEASE;  Surgeon: Linna Hoff, MD;  Location[facility];  Service: Orthopedics;  Laterality: Left;  ANESTHESIA:  LOCAL WITH IV SEDATION    There were no vitals filed for this visit.  Visit Diagnosis:  Muscle weakness (generalized) - Plan: PT plan of care cert/re-cert  Stiffness of hip joint, unspecified laterality - Plan: PT plan of care cert/re-cert  Pain of multiple sites - Plan: PT plan of care cert/re-cert      Subjective Assessment - 05/27/15 0940    Subjective Pt reports developing low back pain yrs ago. The pain has been getting worse more recently. Doesn't remember having therapy for her back. Knees also bother her   How long can you sit comfortably? 30 minutes   How long can you stand comfortably? 5 minutes   Diagnostic tests x-rays of the knees and CT scan of her abdomen. Reports loosen of Rt knee components and disc issues in her back   Patient Stated Goals wishes to walk straight, not stumble, walk for exercise and go places without someone having to take her.    Currently in Pain? Yes   Pain Score 7    Pain Location Back   Pain Descriptors / Indicators Aching  a hurt   Pain Radiating Towards to Lt hip   Pain Frequency Constant   Aggravating Factors  moving around, house work  Pain Relieving Factors extra strength tylenol, ice and rest   Effect of Pain on Daily Activities llimited in her care of her house, difficult with lifitng Rt leg up with lower body dressing.             [facility] PT Assessment - 05/27/15 0001    Assessment   Medical Diagnosis lumbar spondylosis   Onset Date/Surgical Date 05/25/15   Next MD Visit 06/21/15   Prior Therapy none   Precautions   Precautions None   Balance Screen   Has the patient fallen in the past 6 months Yes   How many times? 3+  in the store and hit her head, while turning a corner.    Has the patient had a decrease in activity level because of a fear of falling?  Yes  afraid to travel now, and some housekeeping   Is the patient reluctant to leave their home because of a fear of falling?  Yes   [place]  --  mobile home   Additional Comments no steps   Prior Function   Level of Independence --  Independent   Vocation Retired   Observation/Other Assessments   Focus on Therapeutic Outcomes (FOTO)  60% limited   Posture/Postural Control   Posture/Postural Control Postural limitations   Postural Limitations Rounded Shoulders;Forward head;Increased thoracic kyphosis   ROM / Strength   AROM / PROM / Strength AROM;Strength   AROM   Overall AROM Comments --  pt with fair quad contraction with quad sets bilat.    AROM Assessment Site Lumbar   Lumbar Flexion to lower shin  with pain   Lumbar Extension decreased 75%  with pain   Lumbar - Right Rotation decreased 50%   Lumbar - Left Rotation decreased 50%    Strength   Overall Strength Comments multifidis fair    Strength Assessment Site Hip;Knee;Ankle   Right/Left Hip Right;Left   Right Hip Flexion 5/5   Right Hip Extension 4/5   Right Hip ABduction 4/5   Left Hip Flexion 5/5   Left Hip Extension 3+/5   Left Hip ABduction 3+/5   Right/Left Knee Right;Left   Right Knee Flexion 4/5   Right Knee Extension 4-/5  with pain   Left Knee Flexion 4/5   Left Knee Extension 4+/5   Right/Left Ankle Right;Left   Right Ankle Dorsiflexion 5/5   Right Ankle Plantar Flexion 4/5   Right Ankle Inversion 5/5   Right Ankle Eversion 5/5   Left Ankle Dorsiflexion 5/5   Left Ankle Plantar Flexion 4/5   Left Ankle Inversion 5/5   Flexibility   Soft Tissue Assessment /Muscle Length yes   Hamstrings tight with single knee to chest Lt> Rt                   [facility] Adult PT Treatment/Exercise - 05/27/15 0001    Exercises   Exercises Lumbar   Lumbar Exercises: Supine   Other Supine Lumbar Exercises 10 reps decompression routine with pillow under her head.    Modalities   Modalities Electrical Stimulation;Moist Heat   Moist Heat Therapy   Number Minutes Moist Heat 15 Minutes   Moist Heat Location Lumbar Spine   Electrical Stimulation    Electrical Stimulation Location lumbar   Electrical Stimulation Action IFC   Electrical Stimulation Parameters to tolerance   Electrical Stimulation Goals Pain                PT Education -  05/27/15 1011    Education provided Yes   Education Details HEP postural realignement routine   Person(s) Educated Patient   Methods Explanation;Handout   Comprehension Returned demonstration          PT Short Term Goals - 05/27/15 1014    PT SHORT TERM GOAL #1   Title I with HEP ( 06/17/15)   Time 3   Period Weeks   Status New   PT SHORT TERM GOAL #2   Title increase strength bilat hips =/> 4+/5 to assist with standing activities ( 06/17/15)    Time 3   Period Weeks   Status New   PT SHORT TERM GOAL #3   Title report back pain decrease =/> 25% with walking ( 06/17/15)    Time 3   Period Weeks   Status New           PT Long Term Goals - 05/27/15 1015    PT LONG TERM GOAL #1   Title I with advanced HEP ( 07/08/15)   Time 6   Period Weeks   Status New   PT LONG TERM GOAL #2   Title Report pain decrease =/> 75% in knees and back (07/08/15)    Time 6   Period Weeks   Status New   PT LONG TERM GOAL #3   Title perform vacuuming without increased pain ( 07/08/15)    Time 6   Period Weeks   Status New   PT LONG TERM GOAL #4   Title improve FOTO =/< 47% impaired ( CK level ) ( 07/08/15)                Problem List Patient Active Problem List   Diagnosis Date Noted  . Spondylosis of lumbar region without myelopathy or radiculopathy 05/25/2015  . Cataract 09/05/2013  . Delayed sleep phase syndrome 11/09/2012  . Osteopenia 09/27/2012  . OSA (obstructive sleep apnea) 04/03/2012  . Diabetic neuropathy 12/28/2011  . FATIGUE 04/12/2010  . Diabetes type 2, controlled 06/02/2009  . SCIATICA 04/22/2009  . GASTRIC POLYP 03/18/2008  . Hyperlipidemia 01/17/2008  . THYROID NODULE 11/22/2007  . GERD 09/24/2007  . History of total knee arthroplasty 09/24/2007  . [place]  DISEASE, CERVICAL 09/24/2007  . Major depression, chronic 07/16/2007  . COMMON MIGRAINE 07/11/2007  . HYPERTENSION, BENIGN ESSENTIAL 07/05/2007    Jeral Pinch, PT 05/27/2015, 10:47 AM  [facility] [place], [place], [address] Phone: [phone]   Fax:  [phone]

## 2015-05-27 NOTE — Patient Instructions (Signed)
Decompression Exercise: Basic  [place] [phone]  Have a pillow under your head.  Lie on back on firm surface, knees bent, feet flat, arms turned up, out to sides, backs of hands down. Time _5-10__ minutes. Surface: floor or on bed   Shoulder Press   Press both shoulders down. Hold _3-5__ seconds. Repeat _10__ times. Repeat 2 times a day. Surface: floor or bed  Head Press With Chin Tuck  Have a pillow under your head Tuck chin SLIGHTLY toward chest, keep mouth closed. Feel weight on back of head. Increase weight by pressing head down. Hold _3-5__ seconds. Relax. Repeat _10__ times. Repeat  2 times a day Surface: floor or on a bed  Leg Lengthener: Full  Pillow under head.  Straighten one leg. Pull toes AND forefoot toward knee, extend heel. Lengthen leg by pulling pelvis away from ribs. Hold _3-5__ seconds. Relax. Repeat 10 time. Re-bend knee. Do other leg. Each leg _10__ times. Repeat 2 times a dy Surface: floor or on a bed  Leg Press: Single  Pillow under head.  Straighten one leg down to floor. Bring toes AND forefoot toward knee, extend heel. Press leg down. DO NOT BEND KNEE. Hold _3-5__ seconds. Relax leg. Repeat exercise 10 time. Relax leg. Re-bend knee. Repeat with other leg. Each leg _10__ times. Repeat 2 times a day  [facility] [phone]

## 2015-05-28 ENCOUNTER — Other Ambulatory Visit: Payer: Self-pay | Admitting: Family Medicine

## 2015-06-01 ENCOUNTER — Ambulatory Visit (INDEPENDENT_AMBULATORY_CARE_PROVIDER_SITE_OTHER): Payer: Medicare Other | Admitting: Rehabilitative and Restorative Service Providers"

## 2015-06-01 ENCOUNTER — Encounter: Payer: Self-pay | Admitting: Rehabilitative and Restorative Service Providers"

## 2015-06-01 DIAGNOSIS — M6281 Muscle weakness (generalized): Secondary | ICD-10-CM | POA: Diagnosis not present

## 2015-06-01 DIAGNOSIS — M25659 Stiffness of unspecified hip, not elsewhere classified: Secondary | ICD-10-CM | POA: Diagnosis not present

## 2015-06-01 DIAGNOSIS — R52 Pain, unspecified: Secondary | ICD-10-CM

## 2015-06-01 NOTE — Therapy (Signed)
[place], [place], [address] Phone: [phone]   Fax:  [phone]  Physical Therapy Treatment  Patient Details  Name: Christine Washington MRN: [id number] Date of Birth: [birth year] Referring Provider:  Silverio Decamp,*  Encounter Date: 06/01/2015      PT End of Session - 06/01/15 1153    Visit Number 2   Number of Visits 12   Date for PT Re-Evaluation 07/08/15   PT Start Time 1153   PT Stop Time 1246   PT Time Calculation (min) 53 min   Activity Tolerance Patient tolerated treatment well      Past Medical History  Diagnosis Date  . Hypertension   . Hyperlipidemia   . OA (osteoarthritis) of knee     left- Dr Latanya Maudlin  . Depression   . GERD (gastroesophageal reflux disease)     Dr Olevia Perches  . Diverticulosis   . Anxiety   . Cervical disc disease   . Diabetic neuropathy   . Type 2 diabetes mellitus   . H/O hiatal hernia   . Carpal tunnel syndrome on left   . OSA on CPAP   . History of wrist fracture     LEFT --  09-02-2013/   RESIDUAL DISCOMFORT  . Wears glasses   . History of uterine fibroid     Past Surgical History  Procedure Laterality Date  . Nasal sinus surgery  1990  . Breast reduction surgery  1995  . Total knee arthroplasty Bilateral RIGHT  10-19-2004/   LEFT  10-31-2007  . Knee arthroscopy w/ meniscectomy Left 01-03-2006  . Cardiovascular stress test  12-21-2007    NORMAL NUCLEAR STUDY/  NO ISCHEMIA/  EF 76%  . Transthoracic echocardiogram  12-21-2007    MILD TO MODERATE LAE/  EF 55%/  MILD AV CALCIFICATION WITH NO STENOSIS  . Abdominal hysterectomy  1970's    AND APPENDECTOMY  . Exploratory laparotomy/  lysis adhesions/  bilateral salpingoophorectomy  1980's  . Tonsillectomy  AGE 79  . Carpal tunnel release Left 04/03/2014    Procedure: LEFT CARPAL TUNNEL RELEASE;  Surgeon: Linna Hoff, MD;  Location[facility];  Service: Orthopedics;   Laterality: Left;  ANESTHESIA: LOCAL WITH IV SEDATION    There were no vitals filed for this visit.  Visit Diagnosis:  Muscle weakness (generalized)  Stiffness of hip joint, unspecified laterality  Pain of multiple sites      Subjective Assessment - 06/01/15 1153    Currently in Pain? Yes   Pain Score 5    Pain Location Back   Pain Orientation Left   Pain Descriptors / Indicators Constant;Dull;Aching   Pain Type Chronic pain   Pain Radiating Towards Lt hip   Pain Onset More than a month ago            [facility] Adult PT Treatment/Exercise - 06/01/15 0001    Lumbar Exercises: Standing   Other Standing Lumbar Exercises hip extension back to wall - 10 reps x2 sets   Other Standing Lumbar Exercises scap retraction 10 reps x 2 sets   Lumbar Exercises: Supine   Other Supine Lumbar Exercises 10 reps decompression routine with pillow under her head.    Other Supine Lumbar Exercises shoulder punch to ceiling 10 reps x2 sets           PT Education - 06/01/15 1234    Education provided Yes   Education Details Reviewed exercises - patient unable to  remember the exercises form initial treatment. Added UE punch to ceilling in supine; hip extensiion in standing back to wal and scap squeeze to address posture   Person(s) Educated Patient   Methods Explanation;Demonstration;Tactile cues;Verbal cues;Handout   Comprehension Verbalized understanding;Returned demonstration;Verbal cues required;Tactile cues required;Need further instruction          PT Short Term Goals - 06/01/15 1207    PT SHORT TERM GOAL #1   Title I with HEP ( 06/17/15)   Time 3   Period Weeks   Status On-going   PT SHORT TERM GOAL #2   Title increase strength bilat hips =/> 4+/5 to assist with standing activities ( 06/17/15)    Period Weeks   Status On-going   PT SHORT TERM GOAL #3   Title report back pain decrease =/> 25% with walking ( 06/17/15)    Time 3   Period Weeks   Status On-going           PT  Long Term Goals - 06/01/15 1209    PT LONG TERM GOAL #1   Title I with advanced HEP ( 07/08/15)   Time 6   Period Weeks   Status On-going   PT LONG TERM GOAL #2   Title Report pain decrease =/> 75% in knees and back (07/08/15)    Time 6   Period Weeks   Status On-going   PT LONG TERM GOAL #3   Title perform vacuuming without increased pain ( 07/08/15)    Time 6   Period Weeks   Status On-going   PT LONG TERM GOAL #4   Title improve FOTO =/< 47% impaired ( CK level ) ( 07/08/15)   Time 6   Period Weeks   Status On-going               Plan - 06/01/15 1202    Clinical Impression Statement Patient presents with symptoms consistent with LBP and generalized decreased activity level and decreased strength   Pt will benefit from skilled therapeutic intervention in order to improve on the following deficits Abnormal gait;Decreased endurance;Decreased activity tolerance;Difficulty walking;Decreased strength;Decreased mobility   Rehab Potential Good   PT Frequency 2x / week   PT Duration 6 weeks   PT Treatment/Interventions ADLs/Self Care Home Management;Electrical Stimulation;Cryotherapy;Ultrasound;Moist Heat;Gait training;Functional mobility training;Therapeutic activities;Therapeutic exercise;Neuromuscular re-education;Patient/family education;Manual techniques   PT Next Visit Plan Review HEP; progress exercises as indicated   PT Home Exercise Plan decompression exercise program; progressing with strengthening and stabilization exercises as indicated        Problem List Patient Active Problem List   Diagnosis Date Noted  . Spondylosis of lumbar region without myelopathy or radiculopathy 05/25/2015  . Cataract 09/05/2013  . Delayed sleep phase syndrome 11/09/2012  . Osteopenia 09/27/2012  . OSA (obstructive sleep apnea) 04/03/2012  . Diabetic neuropathy 12/28/2011  . FATIGUE 04/12/2010  . Diabetes type 2, controlled 06/02/2009  . SCIATICA 04/22/2009  . GASTRIC POLYP  03/18/2008  . Hyperlipidemia 01/17/2008  . THYROID NODULE 11/22/2007  . GERD 09/24/2007  . History of total knee arthroplasty 09/24/2007  . [place] DISEASE, CERVICAL 09/24/2007  . Major depression, chronic 07/16/2007  . COMMON MIGRAINE 07/11/2007  . HYPERTENSION, BENIGN ESSENTIAL 07/05/2007    Ronald Londo Nilda Simmer, PT, MPH 06/01/2015, 12:40 PM  [facility] [place], [place], [address] Phone: [phone]   Fax:  [phone]

## 2015-06-01 NOTE — Patient Instructions (Signed)
Shoulder Press: Supine with Protraction   Lying on back. Press one arm up toward the ceiling, then the other.  Repeat _10_ times per set. Do 2-3_ sets per session. Do _2-3_ sessions per week.     Scapular Retraction (Standing)   With arms at sides, pinch shoulder blades together. Repeat _10___ times per set. Do _2-3___ sets per session. Do _2-3___ sessions per day.      Standing - back to wall - push hips away from the wall, shoulders remain touching the wall.  Hold 4-5 sec. Repeat 10 reps 2-3 sets   TENS unit - for treating pain from [email or web address] or other source

## 2015-06-03 ENCOUNTER — Encounter: Payer: Medicare Other | Admitting: Physical Therapy

## 2015-06-05 ENCOUNTER — Encounter: Payer: Self-pay | Admitting: Family Medicine

## 2015-06-05 ENCOUNTER — Ambulatory Visit (INDEPENDENT_AMBULATORY_CARE_PROVIDER_SITE_OTHER): Payer: Medicare Other | Admitting: Family Medicine

## 2015-06-05 VITALS — BP 138/64 | HR 92 | Wt 184.0 lb

## 2015-06-05 DIAGNOSIS — R140 Abdominal distension (gaseous): Secondary | ICD-10-CM

## 2015-06-05 DIAGNOSIS — M8580 Other specified disorders of bone density and structure, unspecified site: Secondary | ICD-10-CM

## 2015-06-05 DIAGNOSIS — R1084 Generalized abdominal pain: Secondary | ICD-10-CM | POA: Diagnosis not present

## 2015-06-05 DIAGNOSIS — E119 Type 2 diabetes mellitus without complications: Secondary | ICD-10-CM

## 2015-06-05 DIAGNOSIS — N281 Cyst of kidney, acquired: Secondary | ICD-10-CM

## 2015-06-05 DIAGNOSIS — I10 Essential (primary) hypertension: Secondary | ICD-10-CM | POA: Diagnosis not present

## 2015-06-05 DIAGNOSIS — R42 Dizziness and giddiness: Secondary | ICD-10-CM

## 2015-06-05 DIAGNOSIS — Q6100 Congenital renal cyst, unspecified: Secondary | ICD-10-CM

## 2015-06-05 LAB — POCT GLYCOSYLATED HEMOGLOBIN (HGB A1C): Hemoglobin A1C: 6.5

## 2015-06-05 NOTE — Progress Notes (Signed)
Subjective:    Patient ID: Christine Washington, female    DOB: Mar 07, 1936, 79 y.o.   MRN: [id number]  HPI Diabetes - no hypoglycemic events. No wounds or sores that are not healing well. No increased thirst or urination. Checking glucose at home occasionally. Taking medications as prescribed without any side effects.  Hypertension- Pt denies chest pain, SOB, dizziness, or heart palpitations. Gets up to pee about 3 times at night.   Taking meds as directed w/o problems.  Denies medication side effects.    Occ gets lightheaded.  Says feels more like dizziness.  No near syncope. No specific triggers. Says it can happen anytime. It doesn't happen at any specific time of day. Is not related to when she takes a specific medication such as her Fosamax. It usually is very brief and just last for a few seconds.    Left lower quadrant pain-she does have some tenderness across the abdomen she said if she leans up against the counter just feels very sore and she feels like a lot of pressure like something is coming out of her bottom. We did do the CT and I reviewed the results with her. We had also previously called her and reviewed the results per there were no acute findings to explain her pain. She was noted to have a cyst on the kidney. She is quite concerned about it because her sister died a couple years ago from renal cancer.  Review of Systems   BP 138/64 mmHg  Pulse 92  Wt 184 lb (83.462 kg)    Allergies  Allergen Reactions  . Ace Inhibitors Cough    Lotensin=REACTION: cough  . Ambien [Zolpidem Tartrate] Other (See Comments)  . Bupropion Hcl Other (See Comments)    Wellbutrin=REACTION: hallucinations  . Lipitor [Atorvastatin] Other (See Comments)    memory  . Pioglitazone Other (See Comments)    Actos=REACTION: headache    Past Medical History  Diagnosis Date  . Hypertension   . Hyperlipidemia   . OA (osteoarthritis) of knee     left- Dr Latanya Maudlin  . Depression   . GERD  (gastroesophageal reflux disease)     Dr Olevia Perches  . Diverticulosis   . Anxiety   . Cervical disc disease   . Diabetic neuropathy   . Type 2 diabetes mellitus   . H/O hiatal hernia   . Carpal tunnel syndrome on left   . OSA on CPAP   . History of wrist fracture     LEFT --  09-02-2013/   RESIDUAL DISCOMFORT  . Wears glasses   . History of uterine fibroid     Past Surgical History  Procedure Laterality Date  . Nasal sinus surgery  1990  . Breast reduction surgery  1995  . Total knee arthroplasty Bilateral RIGHT  10-19-2004/   LEFT  10-31-2007  . Knee arthroscopy w/ meniscectomy Left 01-03-2006  . Cardiovascular stress test  12-21-2007    NORMAL NUCLEAR STUDY/  NO ISCHEMIA/  EF 76%  . Transthoracic echocardiogram  12-21-2007    MILD TO MODERATE LAE/  EF 55%/  MILD AV CALCIFICATION WITH NO STENOSIS  . Abdominal hysterectomy  1970's    AND APPENDECTOMY  . Exploratory laparotomy/  lysis adhesions/  bilateral salpingoophorectomy  1980's  . Tonsillectomy  AGE 38  . Carpal tunnel release Left 04/03/2014    Procedure: LEFT CARPAL TUNNEL RELEASE;  Surgeon: Linna Hoff, MD;  Location: [facility];  Service: Orthopedics;  Laterality: Left;  ANESTHESIA: LOCAL WITH IV SEDATION    History   Social History  . Marital Status: Divorced    Spouse Name: N/A  . Number of Children: N/A  . Years of Education: N/A   Occupational History  . Not on file.   Social History Main Topics  . Smoking status: Never Smoker   . Smokeless tobacco: Never Used  . Alcohol Use: No  . Drug Use: No  . Sexual Activity: Not on file   Other Topics Concern  . 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 Her sister died in 12/27/11.    Family History  Problem Relation Age of Onset  . Hypertension Mother   . Stroke Mother   . Diabetes Mother   . Stroke Father 22  . Hypertension Father   . Cancer Father      melanoma  . Multiple sclerosis Sister     died  . COPD Sister   . Colon cancer Neg Hx     Outpatient Encounter Prescriptions as of 06/05/2015  Medication Sig  . alendronate (FOSAMAX) 70 MG tablet TAKE 1 TABLET BY MOUTH EVERY 7 DAYS WITH A FULL GLASS OF WATER ON AN EMPTY STOMACH  . ALPRAZolam (XANAX) 0.25 MG tablet Take 1 tablet (0.25 mg total) by mouth daily as needed for anxiety.  Marland Kitchen amLODipine (NORVASC) 5 MG tablet TAKE 1 TABLET (5 MG TOTAL) BY MOUTH DAILY.  Marland Kitchen aspirin 81 MG tablet Take 81 mg by mouth daily.    . Cholecalciferol (VITAMIN D3) 1000 UNITS CAPS Take 2 tablets by mouth.  . DULoxetine (CYMBALTA) 30 MG capsule TAKE 1 CAPSULE (30 MG TOTAL) BY MOUTH DAILY.  Marland Kitchen glucose blood test strip Use as instructed  . losartan-hydrochlorothiazide (HYZAAR) 100-12.5 MG per tablet Take 1 tablet by mouth daily.  . Magnesium 250 MG TABS Take 1 tablet by mouth.  . meloxicam (MOBIC) 15 MG tablet One tab PO qAM with breakfast for 2 weeks, then daily prn pain.  . metFORMIN (GLUCOPHAGE) 1000 MG tablet Take 1 tablet (1,000 mg total) by mouth daily with breakfast.  . omeprazole (PRILOSEC) 40 MG capsule TAKE ONE CAPSULE BY MOUTH EVERY DAY  . pravastatin (PRAVACHOL) 80 MG tablet TAKE 1 TABLET BY MOUTH EVERY EVENING  . traZODone (DESYREL) 100 MG tablet Take 1-3 tablets (100-300 mg total) by mouth at bedtime.   No facility-administered encounter medications on file as of 06/05/2015.          Objective:   Physical Exam  Constitutional: She is oriented to person, place, and time. She appears well-developed and well-nourished.  HENT:  Head: Normocephalic and atraumatic.  Right Ear: External ear normal.  Left Ear: External ear normal.  Nose: Nose normal.  Eyes: Conjunctivae are normal.  Neck: Neck supple. No thyromegaly present.  Cardiovascular: Normal rate, regular rhythm and normal heart sounds.   Pulmonary/Chest: Effort normal and breath sounds normal.  Abdominal: Soft. Bowel sounds are normal. She  exhibits no distension and no mass. There is tenderness. There is no rebound and no guarding.  generlized tenderness   Lymphadenopathy:    She has no cervical adenopathy.  Neurological: She is alert and oriented to person, place, and time.  Skin: Skin is warm and dry.  Psychiatric: She has a normal mood and affect. Her behavior is normal.          Assessment & Plan:  DM-  foot exam peformed today. A1C is 6.5, improved.  Well controlled. A1C is 6.f  F/U in 3 months    HTN - well controlled, f/u in 3 months.   Osteopenia- reviewed results of recent DEXA. Make sure taking adequate calcium with vitamin D.  F/u kidney cyst - sister died from renal dz. I will call the radiologist and see if they feel it needs more imagine.    Left lower quadrant pain-today she is just tender diffusely-nothing acute on CT. She takes a probiotic. She avoids milk products. Sometime feels like her inside may be falling out. With concern test her for celiac as well. Could also consider seeing her GI, Dr. Loletha Grayer again. She does get a pressure in her pelvic area when she presses on her abdomen. She has had a hysterectomy but certainly could have some bladder prolapse. Hasn't felt anything in her vagina.  Consider pelvic exam. But did not performed today.  Dizziness - unclear etiology.  Check BP and sugar at home when this happens.  Call if persists.

## 2015-06-08 ENCOUNTER — Telehealth: Payer: Self-pay | Admitting: *Deleted

## 2015-06-08 ENCOUNTER — Encounter: Payer: Medicare Other | Admitting: Physical Therapy

## 2015-06-08 LAB — GLIA (IGA/G) + TTG IGA
GLIADIN IGA: 2 U (ref <20)
GLIADIN IGG: 1 U (ref <20)
TISSUE TRANSGLUTAMINASE AB, IGA: 1 U/mL (ref <4)

## 2015-06-08 NOTE — Telephone Encounter (Signed)
Pt called and stated that she took her blood sugar this morning after she ate it was 286. She only had bowl of cheerios and a banana. Last night she had a salad with dried fruit; raisins, prunes, and cranberries, and pomegranate.  She has taken her medications this morning. I advised her to drink plenty of water and check her blood sugar again in a few hours and if it is still high or if it drops low to call back. Pt voiced understanding and agreed. She asked that I call PT and cancel her appt for this morning which I did while pt was on the phone and she was advised to call their office back to reschedule this appt.Christine Washington [place]

## 2015-06-09 ENCOUNTER — Ambulatory Visit (INDEPENDENT_AMBULATORY_CARE_PROVIDER_SITE_OTHER): Payer: Medicare Other | Admitting: Physical Therapy

## 2015-06-09 DIAGNOSIS — M25659 Stiffness of unspecified hip, not elsewhere classified: Secondary | ICD-10-CM | POA: Diagnosis not present

## 2015-06-09 DIAGNOSIS — M6281 Muscle weakness (generalized): Secondary | ICD-10-CM | POA: Diagnosis not present

## 2015-06-09 DIAGNOSIS — R52 Pain, unspecified: Secondary | ICD-10-CM | POA: Diagnosis not present

## 2015-06-09 NOTE — Patient Instructions (Signed)
Supine to Sit: Phase 3   Lying on back, squeeze pelvic floor and hold. Inhale. Exhale. Roll to side. Push up on elbows as feet are off bed. Relax.    [facility] at [facility] [place], [place] [address]  [phone] (office) [phone] (fax)

## 2015-06-09 NOTE — Therapy (Signed)
[place], [place], [address] Phone: [phone]   Fax:  [phone]  Physical Therapy Treatment  Patient Details  Name: Christine Washington MRN: [id number] Date of Birth: [birth year] Referring Provider:  Silverio Decamp,*  Encounter Date: 06/09/2015      PT End of Session - 06/09/15 1157    Visit Number 3   Number of Visits 12   Date for PT Re-Evaluation 07/08/15   PT Start Time 1152   PT Stop Time 1247   PT Time Calculation (min) 55 min   Activity Tolerance Patient limited by fatigue      Past Medical History  Diagnosis Date  . Hypertension   . Hyperlipidemia   . OA (osteoarthritis) of knee     left- Dr Latanya Maudlin  . Depression   . GERD (gastroesophageal reflux disease)     Dr Olevia Perches  . Diverticulosis   . Anxiety   . Cervical disc disease   . Diabetic neuropathy   . Type 2 diabetes mellitus   . H/O hiatal hernia   . Carpal tunnel syndrome on left   . OSA on CPAP   . History of wrist fracture     LEFT --  09-02-2013/   RESIDUAL DISCOMFORT  . Wears glasses   . History of uterine fibroid     Past Surgical History  Procedure Laterality Date  . Nasal sinus surgery  1990  . Breast reduction surgery  1995  . Total knee arthroplasty Bilateral RIGHT  10-19-2004/   LEFT  10-31-2007  . Knee arthroscopy w/ meniscectomy Left 01-03-2006  . Cardiovascular stress test  12-21-2007    NORMAL NUCLEAR STUDY/  NO ISCHEMIA/  EF 76%  . Transthoracic echocardiogram  12-21-2007    MILD TO MODERATE LAE/  EF 55%/  MILD AV CALCIFICATION WITH NO STENOSIS  . Abdominal hysterectomy  1970's    AND APPENDECTOMY  . Exploratory laparotomy/  lysis adhesions/  bilateral salpingoophorectomy  1980's  . Tonsillectomy  AGE 79  . Carpal tunnel release Left 04/03/2014    Procedure: LEFT CARPAL TUNNEL RELEASE;  Surgeon: Linna Hoff, MD;  Location: [facility];  Service: Orthopedics;  Laterality:  Left;  ANESTHESIA: LOCAL WITH IV SEDATION    There were no vitals filed for this visit.  Visit Diagnosis:  Muscle weakness (generalized)  Stiffness of hip joint, unspecified laterality  Pain of multiple sites      Subjective Assessment - 06/09/15 1158    Subjective Pt reports her blood sugar was 147 this morning (yesterday 286- felt "jerky" and tearful).  Pt reports she feels back is a little better today.    Currently in Pain? Yes   Pain Score 4    Pain Location Back   Pain Orientation Left   Pain Descriptors / Indicators Dull   Aggravating Factors  house work    Pain Relieving Factors rest.             [facility] PT Assessment - 06/09/15 0001    Assessment   Medical Diagnosis lumbar spondylosis   Onset Date/Surgical Date 05/25/15   Next MD Visit 06/21/15                     [facility] Adult PT Treatment/Exercise - 06/09/15 0001    Lumbar Exercises: Supine   Bridge 10 reps;3 seconds   Bridge Limitations Initial rep pt reported Lt hamstring cramp; subsided with stretch.    Straight  Leg Raise 10 reps   Straight Leg Raises Limitations some cramping in L hamstring with RLE raise.    Other Supine Lumbar Exercises Head presses x 3 sec hold x 10 (multiple tactile cues for form); shoulder presses x 3 sec hold x 10 reps; Leg lengthener x 3 sec hold x 5 reps each leg.    Lumbar Exercises: Sidelying   Clam 10 reps  each side   Modalities   Modalities Electrical Stimulation;Moist Heat   Moist Heat Therapy   Number Minutes Moist Heat 15 Minutes   Moist Heat Location Lumbar Spine   Electrical Stimulation   Electrical Stimulation Location Lt hip/SI; bilateral L4 paraspinals.    Electrical Stimulation Action premod to 2 areas   Electrical Stimulation Parameters to tolerance; 15 min    Electrical Stimulation Goals Pain                PT Education - 06/09/15 1204    Education provided Yes   Education Details Instructed pt on Log roll. Required multiple cues for  proper form.    Person(s) Educated Patient   Methods Explanation;Demonstration; hand out    Comprehension Verbalized understanding;Returned demonstration          PT Short Term Goals - 06/01/15 1207    PT SHORT TERM GOAL #1   Title I with HEP ( 06/17/15)   Time 3   Period Weeks   Status On-going   PT SHORT TERM GOAL #2   Title increase strength bilat hips =/> 4+/5 to assist with standing activities ( 06/17/15)    Period Weeks   Status On-going   PT SHORT TERM GOAL #3   Title report back pain decrease =/> 25% with walking ( 06/17/15)    Time 3   Period Weeks   Status On-going           PT Long Term Goals - 06/01/15 1209    PT LONG TERM GOAL #1   Title I with advanced HEP ( 07/08/15)   Time 6   Period Weeks   Status On-going   PT LONG TERM GOAL #2   Title Report pain decrease =/> 75% in knees and back (07/08/15)    Time 6   Period Weeks   Status On-going   PT LONG TERM GOAL #3   Title perform vacuuming without increased pain ( 07/08/15)    Time 6   Period Weeks   Status On-going   PT LONG TERM GOAL #4   Title improve FOTO =/< 47% impaired ( CK level ) ( 07/08/15)   Time 6   Period Weeks   Status On-going               Plan - 06/09/15 1240    Clinical Impression Statement Pt tolerated all exercises with minimal cramping in LLE.  Pt required multiple cues (VC/tactile) for good form with exercises. Pt reported elimination of LBP at end of session; still "woozy" though. Progressing towards goals.    Pt will benefit from skilled therapeutic intervention in order to improve on the following deficits Abnormal gait;Decreased endurance;Decreased activity tolerance;Difficulty walking;Decreased strength;Decreased mobility   Rehab Potential Good   PT Frequency 2x / week   PT Duration 6 weeks   PT Treatment/Interventions ADLs/Self Care Home Management;Electrical Stimulation;Cryotherapy;Ultrasound;Moist Heat;Gait training;Functional mobility training;Therapeutic  activities;Therapeutic exercise;Neuromuscular re-education;Patient/family education;Manual techniques   PT Next Visit Plan Continue progressive strengthening for core/ LE.    Consulted and Agree with Plan of Care Patient  Problem List Patient Active Problem List   Diagnosis Date Noted  . Spondylosis of lumbar region without myelopathy or radiculopathy 05/25/2015  . Cataract 09/05/2013  . Delayed sleep phase syndrome 11/09/2012  . Osteopenia 09/27/2012  . OSA (obstructive sleep apnea) 04/03/2012  . Diabetic neuropathy 12/28/2011  . FATIGUE 04/12/2010  . Diabetes type 2, controlled 06/02/2009  . SCIATICA 04/22/2009  . GASTRIC POLYP 03/18/2008  . Hyperlipidemia 01/17/2008  . THYROID NODULE 11/22/2007  . GERD 09/24/2007  . History of total knee arthroplasty 09/24/2007  . [place] DISEASE, CERVICAL 09/24/2007  . Major depression, chronic 07/16/2007  . COMMON MIGRAINE 07/11/2007  . HYPERTENSION, BENIGN ESSENTIAL 07/05/2007   Kerin Perna, PTA 06/09/2015 12:45 PM   [place], [place], [address] Phone: [phone]   Fax:  [phone]

## 2015-06-10 ENCOUNTER — Ambulatory Visit (INDEPENDENT_AMBULATORY_CARE_PROVIDER_SITE_OTHER): Payer: Medicare Other

## 2015-06-10 DIAGNOSIS — Z1231 Encounter for screening mammogram for malignant neoplasm of breast: Secondary | ICD-10-CM

## 2015-06-11 ENCOUNTER — Other Ambulatory Visit: Payer: Self-pay | Admitting: *Deleted

## 2015-06-12 ENCOUNTER — Ambulatory Visit (INDEPENDENT_AMBULATORY_CARE_PROVIDER_SITE_OTHER): Payer: Medicare Other | Admitting: Physical Therapy

## 2015-06-12 DIAGNOSIS — M6281 Muscle weakness (generalized): Secondary | ICD-10-CM

## 2015-06-12 DIAGNOSIS — M25659 Stiffness of unspecified hip, not elsewhere classified: Secondary | ICD-10-CM | POA: Diagnosis not present

## 2015-06-12 DIAGNOSIS — R52 Pain, unspecified: Secondary | ICD-10-CM

## 2015-06-12 NOTE — Therapy (Signed)
[place], [place], [address] Phone: [phone]   Fax:  [phone]  Physical Therapy Treatment  Patient Details  Name: Christine Washington MRN: [id number] Date of Birth: August 11, 1936 Referring Provider:  Silverio Decamp,*  Encounter Date: 06/12/2015      PT End of Session - 06/12/15 1026    Visit Number 4   Number of Visits 12   Date for PT Re-Evaluation 07/08/15   PT Start Time 1020   PT Stop Time 1101   PT Time Calculation (min) 41 min   Activity Tolerance No increased pain;Patient tolerated treatment well      Past Medical History  Diagnosis Date  . Hypertension   . Hyperlipidemia   . OA (osteoarthritis) of knee     left- Dr Latanya Maudlin  . Depression   . GERD (gastroesophageal reflux disease)     Dr Olevia Perches  . Diverticulosis   . Anxiety   . Cervical disc disease   . Diabetic neuropathy   . Type 2 diabetes mellitus   . H/O hiatal hernia   . Carpal tunnel syndrome on left   . OSA on CPAP   . History of wrist fracture     LEFT --  09-02-2013/   RESIDUAL DISCOMFORT  . Wears glasses   . History of uterine fibroid     Past Surgical History  Procedure Laterality Date  . Nasal sinus surgery  1990  . Breast reduction surgery  1995  . Total knee arthroplasty Bilateral RIGHT  10-19-2004/   LEFT  10-31-2007  . Knee arthroscopy w/ meniscectomy Left 01-03-2006  . Cardiovascular stress test  12-21-2007    NORMAL NUCLEAR STUDY/  NO ISCHEMIA/  EF 76%  . Transthoracic echocardiogram  12-21-2007    MILD TO MODERATE LAE/  EF 55%/  MILD AV CALCIFICATION WITH NO STENOSIS  . Abdominal hysterectomy  1970's    AND APPENDECTOMY  . Exploratory laparotomy/  lysis adhesions/  bilateral salpingoophorectomy  1980's  . Tonsillectomy  AGE 79  . Carpal tunnel release Left 04/03/2014    Procedure: LEFT CARPAL TUNNEL RELEASE;  Surgeon: Linna Hoff, MD;  Location: [facility];  Service:  Orthopedics;  Laterality: Left;  ANESTHESIA: LOCAL WITH IV SEDATION    There were no vitals filed for this visit.  Visit Diagnosis:  Muscle weakness (generalized)  Stiffness of hip joint, unspecified laterality  Pain of multiple sites      Subjective Assessment - 06/12/15 1026    Subjective Pt reports she is feeling better each day.  If she's busy during day, the pain in low back increases.  She has been appling icy-hot to area and resting to reduce symptoms. Pt c/o decreased energy level; states she hasn't had the energy to perform HEP.    Currently in Pain? Yes   Pain Score 2    Pain Location Back   Pain Orientation Left   Pain Descriptors / Indicators Dull   Aggravating Factors  housework   Pain Relieving Factors rest, icy hot            [facility] PT Assessment - 06/12/15 0001    Assessment   Medical Diagnosis lumbar spondylosis   Onset Date/Surgical Date 05/25/15   Next MD Visit 06/21/15           [facility] Adult PT Treatment/Exercise - 06/12/15 0001    Exercises   Exercises Knee/Hip;Lumbar   Lumbar Exercises: Stretches   Passive Hamstring Stretch  2 reps;20 seconds   Piriformis Stretch 30 seconds;3 reps  seated    Lumbar Exercises: Aerobic   Stationary Bike NuStep L3: 5 min (80-90 SPM)   Lumbar Exercises: Supine   Bridge 10 reps;3 seconds   Straight Leg Raise 10 reps  2 sets    Lumbar Exercises: Sidelying   Clam 10 reps  2 sets each side.    Modalities   Modalities --  Pt declined, 0/10 pain at end of session.            PT Short Term Goals - 06/12/15 1138    PT SHORT TERM GOAL #1   Title I with HEP ( 06/17/15)   Time 3   Period Weeks   Status On-going   PT SHORT TERM GOAL #2   Title increase strength bilat hips =/> 4+/5 to assist with standing activities ( 06/17/15)    Time 3   Period Weeks   Status On-going   PT SHORT TERM GOAL #3   Title report back pain decrease =/> 25% with walking ( 06/17/15)    Time 3   Period Weeks   Status Achieved            PT Long Term Goals - 06/01/15 1209    PT LONG TERM GOAL #1   Title I with advanced HEP ( 07/08/15)   Time 6   Period Weeks   Status On-going   PT LONG TERM GOAL #2   Title Report pain decrease =/> 75% in knees and back (07/08/15)    Time 6   Period Weeks   Status On-going   PT LONG TERM GOAL #3   Title perform vacuuming without increased pain ( 07/08/15)    Time 6   Period Weeks   Status On-going   PT LONG TERM GOAL #4   Title improve FOTO =/< 47% impaired ( CK level ) ( 07/08/15)   Time 6   Period Weeks   Status On-going               Plan - 06/12/15 1115    Clinical Impression Statement Pt making good progress, able to tolerate increased reps of LE strengthening. Pt does need frequent cues for form.  Pt's pain eliminated during exercises, so no modalities for pain reduction were needed at end of session.  Pt may need further encouragment for HEP complaince. Pt has met STG #3.   Pt will benefit from skilled therapeutic intervention in order to improve on the following deficits Abnormal gait;Decreased endurance;Decreased activity tolerance;Difficulty walking;Decreased strength;Decreased mobility   Rehab Potential Good   PT Frequency 2x / week   PT Duration 6 weeks   PT Treatment/Interventions ADLs/Self Care Home Management;Electrical Stimulation;Cryotherapy;Ultrasound;Moist Heat;Gait training;Functional mobility training;Therapeutic activities;Therapeutic exercise;Neuromuscular re-education;Patient/family education;Manual techniques   PT Next Visit Plan Continue progressive strengthening for core/ LE. Advance HEP.    Consulted and Agree with Plan of Care Patient        Problem List Patient Active Problem List   Diagnosis Date Noted  . Spondylosis of lumbar region without myelopathy or radiculopathy 05/25/2015  . Cataract 09/05/2013  . Delayed sleep phase syndrome 11/09/2012  . Osteopenia 09/27/2012  . OSA (obstructive sleep apnea) 04/03/2012  . Diabetic  neuropathy 12/28/2011  . FATIGUE 04/12/2010  . Diabetes type 2, controlled 06/02/2009  . SCIATICA 04/22/2009  . GASTRIC POLYP 03/18/2008  . Hyperlipidemia 01/17/2008  . THYROID NODULE 11/22/2007  . GERD 09/24/2007  . History of total knee arthroplasty 09/24/2007  .  [place] DISEASE, CERVICAL 09/24/2007  . Major depression, chronic 07/16/2007  . COMMON MIGRAINE 07/11/2007  . HYPERTENSION, BENIGN ESSENTIAL 07/05/2007    Kerin Perna, PTA 06/12/2015 11:40 AM  [facility] [place], [place], [address] Phone: [phone]   Fax:  [phone]

## 2015-06-15 DIAGNOSIS — Z96653 Presence of artificial knee joint, bilateral: Secondary | ICD-10-CM | POA: Diagnosis not present

## 2015-06-15 DIAGNOSIS — M7051 Other bursitis of knee, right knee: Secondary | ICD-10-CM | POA: Diagnosis not present

## 2015-06-16 ENCOUNTER — Ambulatory Visit (INDEPENDENT_AMBULATORY_CARE_PROVIDER_SITE_OTHER): Payer: Medicare Other | Admitting: Physical Therapy

## 2015-06-16 DIAGNOSIS — M6281 Muscle weakness (generalized): Secondary | ICD-10-CM | POA: Diagnosis not present

## 2015-06-16 DIAGNOSIS — M25659 Stiffness of unspecified hip, not elsewhere classified: Secondary | ICD-10-CM

## 2015-06-16 DIAGNOSIS — R52 Pain, unspecified: Secondary | ICD-10-CM

## 2015-06-16 NOTE — Therapy (Signed)
[place], [place], [address] Phone: [phone]   Fax:  [phone]  Physical Therapy Treatment  Patient Details  Name: Christine Washington MRN: [id number] Date of Birth: [birth year] Referring Provider:  Silverio Decamp,*  Encounter Date: 06/16/2015      PT End of Session - 06/16/15 1201    Visit Number 5   Number of Visits 12   Date for PT Re-Evaluation 07/08/15   PT Start Time 3354   PT Stop Time 1236   PT Time Calculation (min) 38 min   Activity Tolerance Patient tolerated treatment well      Past Medical History  Diagnosis Date  . Hypertension   . Hyperlipidemia   . OA (osteoarthritis) of knee     left- Dr Latanya Maudlin  . Depression   . GERD (gastroesophageal reflux disease)     Dr Olevia Perches  . Diverticulosis   . Anxiety   . Cervical disc disease   . Diabetic neuropathy   . Type 2 diabetes mellitus   . H/O hiatal hernia   . Carpal tunnel syndrome on left   . OSA on CPAP   . History of wrist fracture     LEFT --  09-02-2013/   RESIDUAL DISCOMFORT  . Wears glasses   . History of uterine fibroid     Past Surgical History  Procedure Laterality Date  . Nasal sinus surgery  1990  . Breast reduction surgery  1995  . Total knee arthroplasty Bilateral RIGHT  10-19-2004/   LEFT  10-31-2007  . Knee arthroscopy w/ meniscectomy Left 01-03-2006  . Cardiovascular stress test  12-21-2007    NORMAL NUCLEAR STUDY/  NO ISCHEMIA/  EF 76%  . Transthoracic echocardiogram  12-21-2007    MILD TO MODERATE LAE/  EF 55%/  MILD AV CALCIFICATION WITH NO STENOSIS  . Abdominal hysterectomy  1970's    AND APPENDECTOMY  . Exploratory laparotomy/  lysis adhesions/  bilateral salpingoophorectomy  1980's  . Tonsillectomy  AGE 55  . Carpal tunnel release Left 04/03/2014    Procedure: LEFT CARPAL TUNNEL RELEASE;  Surgeon: Linna Hoff, MD;  Location: [facility];  Service: Orthopedics;   Laterality: Left;  ANESTHESIA: LOCAL WITH IV SEDATION    There were no vitals filed for this visit.  Visit Diagnosis:  Muscle weakness (generalized)  Stiffness of hip joint, unspecified laterality  Pain of multiple sites      Subjective Assessment - 06/16/15 1201    Subjective Pt reports that her exercises are getting easier at home. "Feeling better every day"    Currently in Pain? No/denies, reports slight increase in Lt LB/SI joint pain with hip ext during session; resolved with stretches.             [facility] PT Assessment - 06/16/15 0001    Assessment   Medical Diagnosis lumbar spondylosis   Onset Date/Surgical Date 05/25/15   Next MD Visit 06/21/15   Strength   Strength Assessment Site Hip   Right/Left Hip Right;Left   Right Hip Flexion 5/5   Right Hip Extension 4+/5   Right Hip ABduction --  5-/5   Left Hip Flexion 5/5   Left Hip Extension 4+/5   Left Hip ABduction 4/5  cues for abd vs flex.                     [facility] Adult PT Treatment/Exercise - 06/16/15 0001    Lumbar Exercises:  Stretches   Passive Hamstring Stretch 2 reps;20 seconds  each leg    Piriformis Stretch 30 seconds;3 reps  seated    Lumbar Exercises: Aerobic   Stationary Bike NuStep L4:5 min   Lumbar Exercises: Sidelying   Clam 10 reps  2 sets each side.    Lumbar Exercises: Prone   Straight Leg Raise 10 reps  2 sets each leg   Modalities   Modalities --  Pt declined, 0/10 pain at end of session.                   PT Short Term Goals - 06/16/15 1217    PT SHORT TERM GOAL #1   Title I with HEP ( 06/17/15)   Time 3   Period Weeks   Status On-going   PT SHORT TERM GOAL #2   Title increase strength bilat hips =/> 4+/5 to assist with standing activities ( 06/17/15)    Time 3   Period Weeks   Status Partially Met   PT SHORT TERM GOAL #3   Title report back pain decrease =/> 25% with walking ( 06/17/15)    Time 3   Period Weeks   Status Achieved           PT  Long Term Goals - 06/16/15 1216    PT LONG TERM GOAL #2   Title Report pain decrease =/> 75% in knees and back (07/08/15)    Time 6   Period Weeks   Status Achieved   PT LONG TERM GOAL #3   Title perform vacuuming without increased pain ( 07/08/15)    Time 6   Period Weeks   Status On-going  Has not attempted yet (6/28)   PT LONG TERM GOAL #4   Title improve FOTO =/< 47% impaired ( CK level ) ( 07/08/15)   Time 6   Period Weeks   Status On-going               Plan - 06/16/15 1213    Clinical Impression Statement Pt demo improved hip strength this visit, partially met STG #2.  Pt is reporting decrease in back and knee pain - has met LTG #2. Requires frequent cues for form and to stay on task; requires increased time for position changes due to dizziness. Activity tolerance increasing as well.  Session shortened due to pt's late arrival.    Pt will benefit from skilled therapeutic intervention in order to improve on the following deficits Abnormal gait;Decreased endurance;Decreased activity tolerance;Difficulty walking;Decreased strength;Decreased mobility   Rehab Potential Good   PT Frequency 2x / week   PT Duration 6 weeks   PT Treatment/Interventions ADLs/Self Care Home Management;Electrical Stimulation;Cryotherapy;Ultrasound;Moist Heat;Gait training;Functional mobility training;Therapeutic activities;Therapeutic exercise;Neuromuscular re-education;Patient/family education;Manual techniques   PT Next Visit Plan Continue progressive strengthening for core/ LE. Advance HEP.    Consulted and Agree with Plan of Care Patient        Problem List Patient Active Problem List   Diagnosis Date Noted  . Spondylosis of lumbar region without myelopathy or radiculopathy 05/25/2015  . Cataract 09/05/2013  . Delayed sleep phase syndrome 11/09/2012  . Osteopenia 09/27/2012  . OSA (obstructive sleep apnea) 04/03/2012  . Diabetic neuropathy 12/28/2011  . FATIGUE 04/12/2010  . Diabetes  type 2, controlled 06/02/2009  . SCIATICA 04/22/2009  . GASTRIC POLYP 03/18/2008  . Hyperlipidemia 01/17/2008  . THYROID NODULE 11/22/2007  . GERD 09/24/2007  . History of total knee arthroplasty 09/24/2007  . [place]C DISEASE, CERVICAL  09/24/2007  . Major depression, chronic 07/16/2007  . COMMON MIGRAINE 07/11/2007  . HYPERTENSION, BENIGN ESSENTIAL 07/05/2007   Kerin Perna, PTA 06/16/2015 12:49 PM   [place], [place], [address] Phone: [phone]   Fax:  [phone]

## 2015-06-16 NOTE — Patient Instructions (Signed)
Abduction: Clam (Eccentric) - Side-Lying   Lie on side with knees bent. Lift top knee, keeping feet together. Keep trunk steady. Slowly lower for 3-5 seconds. _10__ reps per set, __2_ sets per day, _3__ days per week.   Piriformis Stretch, Sitting PNF   Sit, one ankle on opposite knee, same-side hand on crossed knee. Bring knee towards opposite shoulder, keeping spine straight. Hold _30__ seconds. Release tension and bring torso closer to calf. At edge of seat, place leg straight in front of you, and sitting up straight lean forward. Should feel stretch in back of leg, but not in BACK.  Hold _30__ seconds.  Repeat _2__ times per session. Do _1-2__ sessions per day.   [facility] at [facility] [place], [place] [address]  [phone] (office) [phone] (fax)

## 2015-06-18 ENCOUNTER — Ambulatory Visit (INDEPENDENT_AMBULATORY_CARE_PROVIDER_SITE_OTHER): Payer: Medicare Other | Admitting: Physical Therapy

## 2015-06-18 DIAGNOSIS — M6281 Muscle weakness (generalized): Secondary | ICD-10-CM

## 2015-06-18 DIAGNOSIS — H10811 Pingueculitis, right eye: Secondary | ICD-10-CM | POA: Diagnosis not present

## 2015-06-18 DIAGNOSIS — R52 Pain, unspecified: Secondary | ICD-10-CM | POA: Diagnosis not present

## 2015-06-18 DIAGNOSIS — M25659 Stiffness of unspecified hip, not elsewhere classified: Secondary | ICD-10-CM | POA: Diagnosis not present

## 2015-06-18 NOTE — Therapy (Signed)
[place] [place], [place], [address] Phone: [phone]   Fax:  [phone]  Physical Therapy Treatment  Patient Details  Name: Christine Washington MRN: [id number] Date of Birth: 13-Dec-1936 Referring Provider:  Silverio Decamp,*  Encounter Date: 06/18/2015      PT End of Session - 06/18/15 0849    Visit Number 6   Number of Visits 12   Date for PT Re-Evaluation 07/08/15   PT Start Time 0848   PT Stop Time 0926   PT Time Calculation (min) 38 min   Activity Tolerance Patient tolerated treatment well      Past Medical History  Diagnosis Date  . Hypertension   . Hyperlipidemia   . OA (osteoarthritis) of knee     left- Dr Latanya Maudlin  . Depression   . GERD (gastroesophageal reflux disease)     Dr Olevia Perches  . Diverticulosis   . Anxiety   . Cervical disc disease   . Diabetic neuropathy   . Type 2 diabetes mellitus   . H/O hiatal hernia   . Carpal tunnel syndrome on left   . OSA on CPAP   . History of wrist fracture     LEFT --  09-02-2013/   RESIDUAL DISCOMFORT  . Wears glasses   . History of uterine fibroid     Past Surgical History  Procedure Laterality Date  . Nasal sinus surgery  1990  . Breast reduction surgery  1995  . Total knee arthroplasty Bilateral RIGHT  10-19-2004/   LEFT  10-31-2007  . Knee arthroscopy w/ meniscectomy Left 01-03-2006  . Cardiovascular stress test  12-21-2007    NORMAL NUCLEAR STUDY/  NO ISCHEMIA/  EF 76%  . Transthoracic echocardiogram  12-21-2007    MILD TO MODERATE LAE/  EF 55%/  MILD AV CALCIFICATION WITH NO STENOSIS  . Abdominal hysterectomy  1970's    AND APPENDECTOMY  . Exploratory laparotomy/  lysis adhesions/  bilateral salpingoophorectomy  1980's  . Tonsillectomy  AGE 89  . Carpal tunnel release Left 04/03/2014    Procedure: LEFT CARPAL TUNNEL RELEASE;  Surgeon: Linna Hoff, MD;  Location: [facility];  Service: Orthopedics;   Laterality: Left;  ANESTHESIA: LOCAL WITH IV SEDATION    There were no vitals filed for this visit.  Visit Diagnosis:  Muscle weakness (generalized)  Stiffness of hip joint, unspecified laterality  Pain of multiple sites      Subjective Assessment - 06/18/15 0850    Subjective Pt reports, "I'm feeling wonderful".  Pt stated that she vacuumed last night and she had 1/10 pain (dull ache); not sharp pain like in the past but does admit to taking Extra strength tylenol prior to cleaning. She has been icing her hip/ back 1-2 x/day; giving relief.    Currently in Pain? No/denies            [facility] PT Assessment - 06/18/15 0001    Assessment   Medical Diagnosis lumbar spondylosis   Onset Date/Surgical Date 05/25/15   Next MD Visit 06/24/15   Strength   Strength Assessment Site Hip   Right/Left Hip Right;Left   Right Hip Flexion 5/5   Right Hip Extension 4+/5   Right Hip ABduction --  5-/5   Left Hip Flexion 5/5   Left Hip Extension 4+/5   Left Hip ABduction 4/5  cues for abd vs flex.            [facility] Adult PT  Treatment/Exercise - 06/18/15 0001    Lumbar Exercises: Stretches   Single Knee to Chest Stretch 1 rep;10 seconds  each side, difficult   Lower Trunk Rotation 3 reps;10 seconds   Lumbar Exercises: Aerobic   Stationary Bike NuStep L4: 5.5 min    Knee/Hip Exercises: Stretches   Passive Hamstring Stretch Right;Left;2 reps;30 seconds   Piriformis Stretch Right;Left;2 reps;20 seconds  seated, cues for form   Knee/Hip Exercises: Standing   Hip Abduction Right;Left;10 reps;2 sets   Hip Extension Right;Left;10 reps;2 sets   Forward Step Up 10 reps;Hand Hold: 2;Step Height: 6";Right;Left  (each side)some visual issues looking down   Knee/Hip Exercises: Seated   Marching Right;Left;1 set  20 steps   Sit to Sand 1 set;10 reps;without UE support   Modalities   Modalities --  Pt declined, will perform at home.                   PT Short Term Goals -  06/16/15 1217    PT SHORT TERM GOAL #1   Title I with HEP ( 06/17/15)   Time 3   Period Weeks   Status On-going   PT SHORT TERM GOAL #2   Title increase strength bilat hips =/> 4+/5 to assist with standing activities ( 06/17/15)    Time 3   Period Weeks   Status Partially Met   PT SHORT TERM GOAL #3   Title report back pain decrease =/> 25% with walking ( 06/17/15)    Time 3   Period Weeks   Status Achieved           PT Long Term Goals - 06/16/15 1216    PT LONG TERM GOAL #2   Title Report pain decrease =/> 75% in knees and back (07/08/15)    Time 6   Period Weeks   Status Achieved   PT LONG TERM GOAL #3   Title perform vacuuming without increased pain ( 07/08/15)    Time 6   Period Weeks   Status On-going  Performed / still increases pain.    PT LONG TERM GOAL #4   Title improve FOTO =/< 47% impaired ( CK level ) ( 07/08/15)   Time 6   Period Weeks   Status On-going               Plan - 06/18/15 0910    Clinical Impression Statement Pt tolerated all new exercises with minimal to no increase in pain.  Pt requires some verbal cues for good form.  Pt making good progress towards remaining goals, noting improved pain level overall and with chores.     Pt will benefit from skilled therapeutic intervention in order to improve on the following deficits Abnormal gait;Decreased endurance;Decreased activity tolerance;Difficulty walking;Decreased strength;Decreased mobility   Rehab Potential Good   PT Frequency 2x / week   PT Treatment/Interventions ADLs/Self Care Home Management;Electrical Stimulation;Cryotherapy;Ultrasound;Moist Heat;Gait training;Functional mobility training;Therapeutic activities;Therapeutic exercise;Neuromuscular re-education;Patient/family education;Manual techniques   PT Next Visit Plan Assess need for continued therapy vs d/c to HEP.    Consulted and Agree with Plan of Care Patient        Problem List Patient Active Problem List   Diagnosis  Date Noted  . Spondylosis of lumbar region without myelopathy or radiculopathy 05/25/2015  . Cataract 09/05/2013  . Delayed sleep phase syndrome 11/09/2012  . Osteopenia 09/27/2012  . OSA (obstructive sleep apnea) 04/03/2012  . Diabetic neuropathy 12/28/2011  . FATIGUE 04/12/2010  . Diabetes type 2,  controlled 06/02/2009  . SCIATICA 04/22/2009  . GASTRIC POLYP 03/18/2008  . Hyperlipidemia 01/17/2008  . THYROID NODULE 11/22/2007  . GERD 09/24/2007  . History of total knee arthroplasty 09/24/2007  . [place] DISEASE, CERVICAL 09/24/2007  . Major depression, chronic 07/16/2007  . COMMON MIGRAINE 07/11/2007  . HYPERTENSION, BENIGN ESSENTIAL 07/05/2007   Kerin Perna, PTA 06/18/2015 9:26 AM  [facility] [place], [place], [address] Phone: [phone]   Fax:  [phone]

## 2015-06-23 ENCOUNTER — Encounter: Payer: Self-pay | Admitting: Podiatry

## 2015-06-23 ENCOUNTER — Ambulatory Visit (INDEPENDENT_AMBULATORY_CARE_PROVIDER_SITE_OTHER): Payer: Medicare Other | Admitting: Podiatry

## 2015-06-23 VITALS — BP 129/73 | HR 93 | Resp 15

## 2015-06-23 DIAGNOSIS — E1140 Type 2 diabetes mellitus with diabetic neuropathy, unspecified: Secondary | ICD-10-CM

## 2015-06-23 DIAGNOSIS — L609 Nail disorder, unspecified: Secondary | ICD-10-CM | POA: Diagnosis not present

## 2015-06-23 DIAGNOSIS — E0842 Diabetes mellitus due to underlying condition with diabetic polyneuropathy: Secondary | ICD-10-CM

## 2015-06-23 DIAGNOSIS — L608 Other nail disorders: Secondary | ICD-10-CM

## 2015-06-23 DIAGNOSIS — L84 Corns and callosities: Secondary | ICD-10-CM

## 2015-06-23 NOTE — Progress Notes (Signed)
   Subjective:    Patient ID: Christine Washington, female    DOB: 03-Apr-1936, 79 y.o.   MRN: [id number]  HPI Pt requesting toenail/skin debridement Patient denies any history of foot ulceration or claudication Patient said no recent podiatric care  Review of Systems  Respiratory: Positive for shortness of breath.   Musculoskeletal: Positive for arthralgias.  All other systems reviewed and are negative.      Objective:   Physical Exam  Orientated 3  Vascular: No peripheral edema noted bilaterally DP and PT pulses 2/4 bilaterally Capillary reflex immediate bilaterally  Neurological: Sensation to 10 g monofilament wire intact 0/5 right and 1/5 left Vibratory sensation nonreactive bilaterally Ankle reflex intact reactive bilaterally  Dermatological: Incurvated toenails without texture color changes 6-10 Plantar callus second and third MPJ right  HAV deformity right Bunionette bilaterally          Assessment & Plan:   Assessment: Satisfactory vascular status Diabetic peripheral neuropathy Incurvated toenails 6-10 Plantar keratoses 2  Plan: Debrided nails 10 and keratoses 2 without any bleeding Reappoint 3 months

## 2015-06-23 NOTE — Patient Instructions (Signed)
Try a walking or running shoe   Diabetes and Foot Care Diabetes may cause you to have problems because of poor blood supply (circulation) to your feet and legs. This may cause the skin on your feet to become thinner, break easier, and heal more slowly. Your skin may become dry, and the skin may peel and crack. You may also have nerve damage in your legs and feet causing decreased feeling in them. You may not notice minor injuries to your feet that could lead to infections or more serious problems. Taking care of your feet is one of the most important things you can do for yourself.  HOME CARE INSTRUCTIONS  Wear shoes at all times, even in the house. Do not go barefoot. Bare feet are easily injured.  Check your feet daily for blisters, cuts, and redness. If you cannot see the bottom of your feet, use a mirror or ask someone for help.  Wash your feet with warm water (do not use hot water) and mild soap. Then pat your feet and the areas between your toes until they are completely dry. Do not soak your feet as this can dry your skin.  Apply a moisturizing lotion or petroleum jelly (that does not contain alcohol and is unscented) to the skin on your feet and to dry, brittle toenails. Do not apply lotion between your toes.  Trim your toenails straight across. Do not dig under them or around the cuticle. File the edges of your nails with an emery board or nail file.  Do not cut corns or calluses or try to remove them with medicine.  Wear clean socks or stockings every day. Make sure they are not too tight. Do not wear knee-high stockings since they may decrease blood flow to your legs.  Wear shoes that fit properly and have enough cushioning. To break in new shoes, wear them for just a few hours a day. This prevents you from injuring your feet. Always look in your shoes before you put them on to be sure there are no objects inside.  Do not cross your legs. This may decrease the blood flow to your  feet.  If you find a minor scrape, cut, or break in the skin on your feet, keep it and the skin around it clean and dry. These areas may be cleansed with mild soap and water. Do not cleanse the area with peroxide, alcohol, or iodine.  When you remove an adhesive bandage, be sure not to damage the skin around it.  If you have a wound, look at it several times a day to make sure it is healing.  Do not use heating pads or hot water bottles. They may burn your skin. If you have lost feeling in your feet or legs, you may not know it is happening until it is too late.  Make sure your health care provider performs a complete foot exam at least annually or more often if you have foot problems. Report any cuts, sores, or bruises to your health care provider immediately. SEEK MEDICAL CARE IF:   You have an injury that is not healing.  You have cuts or breaks in the skin.  You have an ingrown nail.  You notice redness on your legs or feet.  You feel burning or tingling in your legs or feet.  You have pain or cramps in your legs and feet.  Your legs or feet are numb.  Your feet always feel cold. [place]  CARE IF:   There is increasing redness, swelling, or pain in or around a wound.  There is a red line that goes up your leg.  Pus is coming from a wound.  You develop a fever or as directed by your health care provider.  You notice a bad smell coming from an ulcer or wound. Document Released: 12/02/2000 Document Revised: 08/07/2013 Document Reviewed: 05/14/2013 [facility] Patient Information 2015 [place], [place]. This information is not intended to replace advice given to you by your health care provider. Make sure you discuss any questions you have with your health care provider.   Diabetes and Foot Care Diabetes may cause you to have problems because of poor blood supply (circulation) to your feet and legs. This may cause the skin on your feet to become thinner, break  easier, and heal more slowly. Your skin may become dry, and the skin may peel and crack. You may also have nerve damage in your legs and feet causing decreased feeling in them. You may not notice minor injuries to your feet that could lead to infections or more serious problems. Taking care of your feet is one of the most important things you can do for yourself.  HOME CARE INSTRUCTIONS  Wear shoes at all times, even in the house. Do not go barefoot. Bare feet are easily injured.  Check your feet daily for blisters, cuts, and redness. If you cannot see the bottom of your feet, use a mirror or ask someone for help.  Wash your feet with warm water (do not use hot water) and mild soap. Then pat your feet and the areas between your toes until they are completely dry. Do not soak your feet as this can dry your skin.  Apply a moisturizing lotion or petroleum jelly (that does not contain alcohol and is unscented) to the skin on your feet and to dry, brittle toenails. Do not apply lotion between your toes.  Trim your toenails straight across. Do not dig under them or around the cuticle. File the edges of your nails with an emery board or nail file.  Do not cut corns or calluses or try to remove them with medicine.  Wear clean socks or stockings every day. Make sure they are not too tight. Do not wear knee-high stockings since they may decrease blood flow to your legs.  Wear shoes that fit properly and have enough cushioning. To break in new shoes, wear them for just a few hours a day. This prevents you from injuring your feet. Always look in your shoes before you put them on to be sure there are no objects inside.  Do not cross your legs. This may decrease the blood flow to your feet.  If you find a minor scrape, cut, or break in the skin on your feet, keep it and the skin around it clean and dry. These areas may be cleansed with mild soap and water. Do not cleanse the area with peroxide, alcohol, or  iodine.  When you remove an adhesive bandage, be sure not to damage the skin around it.  If you have a wound, look at it several times a day to make sure it is healing.  Do not use heating pads or hot water bottles. They may burn your skin. If you have lost feeling in your feet or legs, you may not know it is happening until it is too late.  Make sure your health care provider performs a complete foot exam at  least annually or more often if you have foot problems. Report any cuts, sores, or bruises to your health care provider immediately. SEEK MEDICAL CARE IF:   You have an injury that is not healing.  You have cuts or breaks in the skin.  You have an ingrown nail.  You notice redness on your legs or feet.  You feel burning or tingling in your legs or feet.  You have pain or cramps in your legs and feet.  Your legs or feet are numb.  Your feet always feel cold. SEEK IMMEDIATE MEDICAL CARE IF:   There is increasing redness, swelling, or pain in or around a wound.  There is a red line that goes up your leg.  Pus is coming from a wound.  You develop a fever or as directed by your health care provider.  You notice a bad smell coming from an ulcer or wound. Document Released: 12/02/2000 Document Revised: 08/07/2013 Document Reviewed: 05/14/2013 [facility] Patient Information 2015 [place], [place]. This information is not intended to replace advice given to you by your health care provider. Make sure you discuss any questions you have with your health care provider.  Diabetes and Foot Care Diabetes may cause you to have problems because of poor blood supply (circulation) to your feet and legs. This may cause the skin on your feet to become thinner, break easier, and heal more slowly. Your skin may become dry, and the skin may peel and crack. You may also have nerve damage in your legs and feet causing decreased feeling in them. You may not notice minor injuries to your feet that  could lead to infections or more serious problems. Taking care of your feet is one of the most important things you can do for yourself.  HOME CARE INSTRUCTIONS  Wear shoes at all times, even in the house. Do not go barefoot. Bare feet are easily injured.  Check your feet daily for blisters, cuts, and redness. If you cannot see the bottom of your feet, use a mirror or ask someone for help.  Wash your feet with warm water (do not use hot water) and mild soap. Then pat your feet and the areas between your toes until they are completely dry. Do not soak your feet as this can dry your skin.  Apply a moisturizing lotion or petroleum jelly (that does not contain alcohol and is unscented) to the skin on your feet and to dry, brittle toenails. Do not apply lotion between your toes.  Trim your toenails straight across. Do not dig under them or around the cuticle. File the edges of your nails with an emery board or nail file.  Do not cut corns or calluses or try to remove them with medicine.  Wear clean socks or stockings every day. Make sure they are not too tight. Do not wear knee-high stockings since they may decrease blood flow to your legs.  Wear shoes that fit properly and have enough cushioning. To break in new shoes, wear them for just a few hours a day. This prevents you from injuring your feet. Always look in your shoes before you put them on to be sure there are no objects inside.  Do not cross your legs. This may decrease the blood flow to your feet.  If you find a minor scrape, cut, or break in the skin on your feet, keep it and the skin around it clean and dry. These areas may be cleansed with mild soap and  water. Do not cleanse the area with peroxide, alcohol, or iodine.  When you remove an adhesive bandage, be sure not to damage the skin around it.  If you have a wound, look at it several times a day to make sure it is healing.  Do not use heating pads or hot water bottles. They may  burn your skin. If you have lost feeling in your feet or legs, you may not know it is happening until it is too late.  Make sure your health care provider performs a complete foot exam at least annually or more often if you have foot problems. Report any cuts, sores, or bruises to your health care provider immediately. SEEK MEDICAL CARE IF:   You have an injury that is not healing.  You have cuts or breaks in the skin.  You have an ingrown nail.  You notice redness on your legs or feet.  You feel burning or tingling in your legs or feet.  You have pain or cramps in your legs and feet.  Your legs or feet are numb.  Your feet always feel cold. SEEK IMMEDIATE MEDICAL CARE IF:   There is increasing redness, swelling, or pain in or around a wound.  There is a red line that goes up your leg.  Pus is coming from a wound.  You develop a fever or as directed by your health care provider.  You notice a bad smell coming from an ulcer or wound. Document Released: 12/02/2000 Document Revised: 08/07/2013 Document Reviewed: 05/14/2013 [facility] Patient Information 2015 [place], [place]. This information is not intended to replace advice given to you by your health care provider. Make sure you discuss any questions you have with your health care provider.

## 2015-06-24 ENCOUNTER — Encounter: Payer: Medicare Other | Admitting: Physical Therapy

## 2015-06-24 ENCOUNTER — Ambulatory Visit (INDEPENDENT_AMBULATORY_CARE_PROVIDER_SITE_OTHER): Payer: Medicare Other | Admitting: Sports Medicine

## 2015-06-24 ENCOUNTER — Encounter: Payer: Self-pay | Admitting: Sports Medicine

## 2015-06-24 VITALS — BP 148/81 | HR 84 | Ht 66.0 in | Wt 185.0 lb

## 2015-06-24 DIAGNOSIS — M47816 Spondylosis without myelopathy or radiculopathy, lumbar region: Secondary | ICD-10-CM | POA: Diagnosis not present

## 2015-06-24 DIAGNOSIS — Z96653 Presence of artificial knee joint, bilateral: Secondary | ICD-10-CM

## 2015-06-24 DIAGNOSIS — F322 Major depressive disorder, single episode, severe without psychotic features: Secondary | ICD-10-CM | POA: Diagnosis not present

## 2015-06-24 DIAGNOSIS — F329 Major depressive disorder, single episode, unspecified: Secondary | ICD-10-CM

## 2015-06-24 NOTE — Assessment & Plan Note (Addendum)
X-rays unremarkable, she did get an injection by Dr. Rhona Raider into what seems to be the pes anserine bursa, this improved her pain significantly. Further treatment of her arthroplasty pain will be with Dr. Rhona Raider.

## 2015-06-24 NOTE — Assessment & Plan Note (Signed)
Increasing Cymbalta to 60 mg, a large amount of her musculoskeletal pain is myofascial.  We will need to control her myofascial pain before proceeding with any lumbar spine interventions

## 2015-06-24 NOTE — Assessment & Plan Note (Signed)
With multilevel degenerative disc disease at L3-4, L4-5, and L5-S1. There is also bilateral L5-S1 facet arthritis. I do think there is a significant element of myofascial pain and depression, so we will treat this first before proceeding to any interventions on her lumbar spine. Her last MRI is 79 years old so we will obtain a new lumbar spine MRI, pain is discogenic with mild right-sided radiculopathy so I do anticipate an epidural as the first intervention.

## 2015-06-24 NOTE — Progress Notes (Signed)
  Subjective:    CC: Follow-up  HPI: This is a pleasant 79 year old female, she comes in for follow-up of right worse than left knee pain, she is post bilateral total knee arthroplasty, suspected initially loosening of the prosthesis versus PES anserine bursitis, x-rays were unremarkable, she has not yet had a bone scan, she saw Dr. Rhona Raider, he injected her right knee, with what sounds to be a PES anserine injection. She had temporary but moderate relief.  We've also been discussing her low back pain, she has multilevel lumbar spondylosis, with predominantly right-sided axial back pain, pain is predominant with discogenic, worse with flexion, Valsalva.  On further questioning, she endorses mild anhedonia, depressed mood, poor energy, poor appetite, guilt, and denies any suicidal or homicidal ideation. She does tell me she continues to feel depressed, she is currently taking 30 mg of Cymbalta, was recently decreased from 60 mg along with a discontinuation of Seroquel.  Past medical history, Surgical history, Family history not pertinant except as noted below, Social history, Allergies, and medications have been entered into the medical record, reviewed, and no changes needed.   Review of Systems: No fevers, chills, night sweats, weight loss, chest pain, or shortness of breath.   Objective:    General: Well Developed, well nourished, and in no acute distress. Tearful in the exam room Neuro: Alert and oriented x3, extra-ocular muscles intact, sensation grossly intact.  HEENT: Normocephalic, atraumatic, pupils equal round reactive to light, neck supple, no masses, no lymphadenopathy, thyroid nonpalpable.  Skin: Warm and dry, no rashes. Cardiac: Regular rate and rhythm, no murmurs rubs or gallops, no lower extremity edema.  Respiratory: Clear to auscultation bilaterally. Not using accessory muscles, speaking in full sentences. Extremities: Diffusely tender to palpation out of proportion with  degree and pressure of palpation over neck, shoulders, knees, legs bilaterally.  I did personally review her MRI, there is multilevel L3-L4, L4-L5, and L5-S1 degenerative disc disease with bilateral L5-S1 facet arthritis.  Impression and Recommendations:

## 2015-06-26 ENCOUNTER — Telehealth: Payer: Self-pay | Admitting: *Deleted

## 2015-06-26 NOTE — Telephone Encounter (Signed)
Pt has an MRI on Monday.  She already takes a half tab of alprazolam prn.  She wanted to know if she could just use that prior to her MRI & if so, what would be the directions?

## 2015-06-26 NOTE — Telephone Encounter (Signed)
Can take whole tab before procedure.

## 2015-06-29 ENCOUNTER — Ambulatory Visit (INDEPENDENT_AMBULATORY_CARE_PROVIDER_SITE_OTHER): Payer: Medicare Other

## 2015-06-29 ENCOUNTER — Ambulatory Visit (INDEPENDENT_AMBULATORY_CARE_PROVIDER_SITE_OTHER): Payer: Medicare Other | Admitting: Rehabilitative and Restorative Service Providers"

## 2015-06-29 ENCOUNTER — Encounter: Payer: Self-pay | Admitting: Rehabilitative and Restorative Service Providers"

## 2015-06-29 DIAGNOSIS — M5127 Other intervertebral disc displacement, lumbosacral region: Secondary | ICD-10-CM

## 2015-06-29 DIAGNOSIS — M5136 Other intervertebral disc degeneration, lumbar region: Secondary | ICD-10-CM | POA: Diagnosis not present

## 2015-06-29 DIAGNOSIS — M25659 Stiffness of unspecified hip, not elsewhere classified: Secondary | ICD-10-CM | POA: Diagnosis not present

## 2015-06-29 DIAGNOSIS — M47896 Other spondylosis, lumbar region: Secondary | ICD-10-CM | POA: Diagnosis not present

## 2015-06-29 DIAGNOSIS — R52 Pain, unspecified: Secondary | ICD-10-CM | POA: Diagnosis not present

## 2015-06-29 DIAGNOSIS — M6281 Muscle weakness (generalized): Secondary | ICD-10-CM

## 2015-06-29 DIAGNOSIS — M47816 Spondylosis without myelopathy or radiculopathy, lumbar region: Secondary | ICD-10-CM

## 2015-06-29 DIAGNOSIS — M5126 Other intervertebral disc displacement, lumbar region: Secondary | ICD-10-CM | POA: Diagnosis not present

## 2015-06-29 NOTE — Therapy (Signed)
[place], [place], [address] Phone: [phone]   Fax:  [phone]  Physical Therapy Treatment  Patient Details  Name: Christine Washington MRN: [id number] Date of Birth: 23-Jun-1936 Referring Provider:  Silverio Decamp,*  Encounter Date: 06/29/2015      PT End of Session - 06/29/15 1025    Visit Number 7   Number of Visits 12   Date for PT Re-Evaluation 07/08/15   PT Start Time 5456   PT Stop Time 1059   PT Time Calculation (min) 44 min   Activity Tolerance Patient tolerated treatment well      Past Medical History  Diagnosis Date  . Hypertension   . Hyperlipidemia   . OA (osteoarthritis) of knee     left- Dr Latanya Maudlin  . Depression   . GERD (gastroesophageal reflux disease)     Dr Olevia Perches  . Diverticulosis   . Anxiety   . Cervical disc disease   . Diabetic neuropathy   . Type 2 diabetes mellitus   . H/O hiatal hernia   . Carpal tunnel syndrome on left   . OSA on CPAP   . History of wrist fracture     LEFT --  09-02-2013/   RESIDUAL DISCOMFORT  . Wears glasses   . History of uterine fibroid     Past Surgical History  Procedure Laterality Date  . Nasal sinus surgery  1990  . Breast reduction surgery  1995  . Total knee arthroplasty Bilateral RIGHT  10-19-2004/   LEFT  10-31-2007  . Knee arthroscopy w/ meniscectomy Left 01-03-2006  . Cardiovascular stress test  12-21-2007    NORMAL NUCLEAR STUDY/  NO ISCHEMIA/  EF 76%  . Transthoracic echocardiogram  12-21-2007    MILD TO MODERATE LAE/  EF 55%/  MILD AV CALCIFICATION WITH NO STENOSIS  . Abdominal hysterectomy  1970's    AND APPENDECTOMY  . Exploratory laparotomy/  lysis adhesions/  bilateral salpingoophorectomy  1980's  . Tonsillectomy  AGE 3  . Carpal tunnel release Left 04/03/2014    Procedure: LEFT CARPAL TUNNEL RELEASE;  Surgeon: Linna Hoff, MD;  Location: [facility];  Service: Orthopedics;   Laterality: Left;  ANESTHESIA: LOCAL WITH IV SEDATION    There were no vitals filed for this visit.  Visit Diagnosis:  Muscle weakness (generalized)  Stiffness of hip joint, unspecified laterality  Pain of multiple sites      Subjective Assessment - 06/29/15 1018    Subjective Has been doing better with LBP - but having some problems with her nerves and some depression - as had problems with this in the past; has discussed this with MD. Has an MRI scheduled for this afternoon.   How long can you sit comfortably? 30 minutes   How long can you stand comfortably? 5 minutes   Diagnostic tests x-rays of the knees and CT scan of her abdomen. Reports loosen of Rt knee components and disc issues in her back   Patient Stated Goals wishes to walk straight, not stumble, walk for exercise and go places without someone having to take her.    Currently in Pain? Yes   Pain Score 5    Pain Location Back   Pain Orientation Left;Right   Pain Descriptors / Indicators Dull;Aching   Pain Type Chronic pain   Pain Radiating Towards just in the Lt hip   Pain Onset More than a month ago   Pain Frequency Constant  Aggravating Factors  making bed; housework; not doing exercises   Pain Relieving Factors exercises; rest; icy hot            [facility] PT Assessment - 06/29/15 0001    Assessment   Medical Diagnosis lumbar spondylosis   Onset Date/Surgical Date 05/25/15   Next MD Visit 07/16   Strength   Right Hip Flexion 5/5   Right Hip Extension 4+/5   Left Hip Flexion 5/5   Left Hip Extension 4+/5   Left Hip ABduction 4/5           [facility] Adult PT Treatment/Exercise - 06/29/15 0001    Lumbar Exercises: Stretches   Single Knee to Chest Stretch 1 rep;10 seconds  each side, difficult   Lumbar Exercises: Supine   Ab Set 10 reps;5 seconds   Bridge 10 reps;5 seconds   Straight Leg Raise 10 reps;3 seconds   Lumbar Exercises: Sidelying   Clam 20 reps   Lumbar Exercises: Prone   Straight Leg  Raise 10 reps   Knee/Hip Exercises: Stretches   Passive Hamstring Stretch Right;Left;2 reps;30 seconds   Moist Heat Therapy   Number Minutes Moist Heat 12 Minutes   Moist Heat Location Lumbar Spine   Manual Therapy   Manual Therapy Soft tissue mobilization   Manual therapy comments working through Lt lumbar musculature with pt in prone note tightness            PT Education - 06/29/15 1056    Education provided Yes   Education Details Encouraged patient to resume consistent, regular exercise program    Person(s) Educated Patient   Methods Explanation   Comprehension Verbalized understanding          PT Short Term Goals - 06/16/15 1217    PT SHORT TERM GOAL #1   Title I with HEP ( 06/17/15)   Time 3   Period Weeks   Status On-going   PT SHORT TERM GOAL #2   Title increase strength bilat hips =/> 4+/5 to assist with standing activities ( 06/17/15)    Time 3   Period Weeks   Status Partially Met   PT SHORT TERM GOAL #3   Title report back pain decrease =/> 25% with walking ( 06/17/15)    Time 3   Period Weeks   Status Achieved           PT Long Term Goals - 06/16/15 1216    PT LONG TERM GOAL #2   Title Report pain decrease =/> 75% in knees and back (07/08/15)    Time 6   Period Weeks   Status Achieved   PT LONG TERM GOAL #3   Title perform vacuuming without increased pain ( 07/08/15)    Time 6   Period Weeks   Status On-going  Has not attempted yet (6/28)   PT LONG TERM GOAL #4   Title improve FOTO =/< 47% impaired ( CK level ) ( 07/08/15)   Time 6   Period Weeks   Status On-going            Plan - 06/29/15 1025    Clinical Impression Statement Increased symptoms today likely related to decreased activity level and not doing her exercises for the past several days due to some increased depression. Scheduled for MRI today for LBP.   Pt will benefit from skilled therapeutic intervention in order to improve on the following deficits Abnormal  gait;Decreased endurance;Decreased activity tolerance;Difficulty walking;Decreased strength;Decreased mobility   Rehab Potential  Good   PT Frequency 2x / week   PT Duration 6 weeks   PT Treatment/Interventions ADLs/Self Care Home Management;Electrical Stimulation;Cryotherapy;Ultrasound;Moist Heat;Gait training;Functional mobility training;Therapeutic activities;Therapeutic exercise;Neuromuscular re-education;Patient/family education;Manual techniques   PT Next Visit Plan Assess canges following today's visit and encouraging patinet to return to regular daily activity level.   PT Home Exercise Plan decompression exercise program; progressing with strengthening and stabilization exercises as indicated   Consulted and Agree with Plan of Care Patient        Problem List Patient Active Problem List   Diagnosis Date Noted  . Spondylosis of lumbar region without myelopathy or radiculopathy 05/25/2015  . Cataract 09/05/2013  . Delayed sleep phase syndrome 11/09/2012  . Osteopenia 09/27/2012  . OSA (obstructive sleep apnea) 04/03/2012  . Diabetic neuropathy 12/28/2011  . FATIGUE 04/12/2010  . Diabetes type 2, controlled 06/02/2009  . SCIATICA 04/22/2009  . GASTRIC POLYP 03/18/2008  . Hyperlipidemia 01/17/2008  . THYROID NODULE 11/22/2007  . GERD 09/24/2007  . History of total knee arthroplasty 09/24/2007  . [place] DISEASE, CERVICAL 09/24/2007  . Major depression, chronic 07/16/2007  . COMMON MIGRAINE 07/11/2007  . HYPERTENSION, BENIGN ESSENTIAL 07/05/2007    Lesslie Mossa Lolita Lenz, MPH 06/29/2015, 11:02 AM  [facility] [place], [place], [address] Phone: [phone]   Fax:  [phone]

## 2015-06-29 NOTE — Telephone Encounter (Signed)
Pt notified to take a whole tab of her xanax 30 min prior to her MRI.

## 2015-07-01 ENCOUNTER — Ambulatory Visit (INDEPENDENT_AMBULATORY_CARE_PROVIDER_SITE_OTHER): Payer: Medicare Other | Admitting: Physical Therapy

## 2015-07-01 DIAGNOSIS — R52 Pain, unspecified: Secondary | ICD-10-CM

## 2015-07-01 DIAGNOSIS — M6281 Muscle weakness (generalized): Secondary | ICD-10-CM

## 2015-07-01 DIAGNOSIS — M25659 Stiffness of unspecified hip, not elsewhere classified: Secondary | ICD-10-CM

## 2015-07-01 NOTE — Therapy (Signed)
[place], [place], [address] Phone: [phone]   Fax:  [phone]  Physical Therapy Treatment  Patient Details  Name: Christine Washington MRN: [id number] Date of Birth: March 28, 1936 Referring Provider:  Silverio Decamp,*  Encounter Date: 07/01/2015      PT End of Session - 07/01/15 1411    Visit Number 8   Number of Visits 12   Date for PT Re-Evaluation 07/08/15   PT Start Time 2671   PT Stop Time 1501   PT Time Calculation (min) 53 min      Past Medical History  Diagnosis Date  . Hypertension   . Hyperlipidemia   . OA (osteoarthritis) of knee     left- Dr Latanya Maudlin  . Depression   . GERD (gastroesophageal reflux disease)     Dr Olevia Perches  . Diverticulosis   . Anxiety   . Cervical disc disease   . Diabetic neuropathy   . Type 2 diabetes mellitus   . H/O hiatal hernia   . Carpal tunnel syndrome on left   . OSA on CPAP   . History of wrist fracture     LEFT --  09-02-2013/   RESIDUAL DISCOMFORT  . Wears glasses   . History of uterine fibroid     Past Surgical History  Procedure Laterality Date  . Nasal sinus surgery  1990  . Breast reduction surgery  1995  . Total knee arthroplasty Bilateral RIGHT  10-19-2004/   LEFT  10-31-2007  . Knee arthroscopy w/ meniscectomy Left 01-03-2006  . Cardiovascular stress test  12-21-2007    NORMAL NUCLEAR STUDY/  NO ISCHEMIA/  EF 76%  . Transthoracic echocardiogram  12-21-2007    MILD TO MODERATE LAE/  EF 55%/  MILD AV CALCIFICATION WITH NO STENOSIS  . Abdominal hysterectomy  1970's    AND APPENDECTOMY  . Exploratory laparotomy/  lysis adhesions/  bilateral salpingoophorectomy  1980's  . Tonsillectomy  AGE 37  . Carpal tunnel release Left 04/03/2014    Procedure: LEFT CARPAL TUNNEL RELEASE;  Surgeon: Linna Hoff, MD;  Location: [facility];  Service: Orthopedics;  Laterality: Left;  ANESTHESIA: LOCAL WITH IV SEDATION    There  were no vitals filed for this visit.  Visit Diagnosis:  Muscle weakness (generalized)  Stiffness of hip joint, unspecified laterality  Pain of multiple sites      Subjective Assessment - 07/01/15 1412    Subjective "I feel wobbly".  Pt reports she is anxious to learn the results of her recent MRI.  Back still bothers her most with morning chores (making bed)    Currently in Pain? Yes   Pain Score 3    Pain Location Back   Pain Orientation Right;Left;Lower  below the belt line   Pain Descriptors / Indicators Dull;Aching   Aggravating Factors  making bed; house work    Pain Relieving Factors exercise; rest; ice             [facility] PT Assessment - 07/01/15 0001    Assessment   Medical Diagnosis lumbar spondylosis   Onset Date/Surgical Date 05/25/15                     [facility] Adult PT Treatment/Exercise - 07/01/15 0001    Lumbar Exercises: Stretches   Single Knee to Chest Stretch 2 reps;20 seconds   Lower Trunk Rotation 3 reps;10 seconds   Lumbar Exercises: Aerobic   Stationary Bike  NuStep L 3: 6 min    Lumbar Exercises: Supine   Bridge 10 reps  1st set- 1 sec hold, 2nd set 5 sec hold.   Straight Leg Raise 10 reps;1 second   Other Supine Lumbar Exercises Decompression position:  leg press x 5 sec hold x 10 reps each leg ( with pillow under head)   Lumbar Exercises: Sidelying   Clam 20 reps   Lumbar Exercises: Prone   Straight Leg Raise 10 reps  each leg   Modalities   Modalities Cryotherapy   Cryotherapy   Number Minutes Cryotherapy 12 Minutes   Cryotherapy Location Lumbar Spine   Type of Cryotherapy Ice pack                  PT Short Term Goals - 06/16/15 1217    PT SHORT TERM GOAL #1   Title I with HEP ( 06/17/15)   Time 3   Period Weeks   Status On-going   PT SHORT TERM GOAL #2   Title increase strength bilat hips =/> 4+/5 to assist with standing activities ( 06/17/15)    Time 3   Period Weeks   Status Partially Met   PT SHORT TERM  GOAL #3   Title report back pain decrease =/> 25% with walking ( 06/17/15)    Time 3   Period Weeks   Status Achieved           PT Long Term Goals - 06/16/15 1216    PT LONG TERM GOAL #2   Title Report pain decrease =/> 75% in knees and back (07/08/15)    Time 6   Period Weeks   Status Achieved   PT LONG TERM GOAL #3   Title perform vacuuming without increased pain ( 07/08/15)    Time 6   Period Weeks   Status On-going  Has not attempted yet (6/28)   PT LONG TERM GOAL #4   Title improve FOTO =/< 47% impaired ( CK level ) ( 07/08/15)   Time 6   Period Weeks   Status On-going               Plan - 07/01/15 1447    Clinical Impression Statement Pt tolerated all exercises without increase in symptoms- most LB discomfort resolved during ther ex. Cryotherapy at end resolved remaining pain. Pt making good progress towards remaining goals.    Pt will benefit from skilled therapeutic intervention in order to improve on the following deficits Abnormal gait;Decreased endurance;Decreased activity tolerance;Difficulty walking;Decreased strength;Decreased mobility   Rehab Potential Good   PT Frequency 2x / week   PT Duration 6 weeks   PT Treatment/Interventions ADLs/Self Care Home Management;Electrical Stimulation;Cryotherapy;Ultrasound;Moist Heat;Gait training;Functional mobility training;Therapeutic activities;Therapeutic exercise;Neuromuscular re-education;Patient/family education;Manual techniques   PT Next Visit Plan Continue progressive core/ LE strengthening; advance HEP.    Consulted and Agree with Plan of Care Patient        Problem List Patient Active Problem List   Diagnosis Date Noted  . Spondylosis of lumbar region without myelopathy or radiculopathy 05/25/2015  . Cataract 09/05/2013  . Delayed sleep phase syndrome 11/09/2012  . Osteopenia 09/27/2012  . OSA (obstructive sleep apnea) 04/03/2012  . Diabetic neuropathy 12/28/2011  . FATIGUE 04/12/2010  . Diabetes  type 2, controlled 06/02/2009  . SCIATICA 04/22/2009  . GASTRIC POLYP 03/18/2008  . Hyperlipidemia 01/17/2008  . THYROID NODULE 11/22/2007  . GERD 09/24/2007  . History of total knee arthroplasty 09/24/2007  . [place] DISEASE, CERVICAL 09/24/2007  .  Major depression, chronic 07/16/2007  . COMMON MIGRAINE 07/11/2007  . HYPERTENSION, BENIGN ESSENTIAL 07/05/2007    Kerin Perna, PTA 07/01/2015 2:59 PM  [place], [place], [address] Phone: [phone]   Fax:  [phone]

## 2015-07-03 ENCOUNTER — Ambulatory Visit: Payer: Medicare Other | Admitting: Sports Medicine

## 2015-07-06 ENCOUNTER — Ambulatory Visit (INDEPENDENT_AMBULATORY_CARE_PROVIDER_SITE_OTHER): Payer: Medicare Other | Admitting: Physical Therapy

## 2015-07-06 DIAGNOSIS — M6281 Muscle weakness (generalized): Secondary | ICD-10-CM

## 2015-07-06 DIAGNOSIS — R52 Pain, unspecified: Secondary | ICD-10-CM

## 2015-07-06 DIAGNOSIS — M25659 Stiffness of unspecified hip, not elsewhere classified: Secondary | ICD-10-CM | POA: Diagnosis not present

## 2015-07-06 NOTE — Therapy (Addendum)
[place], [place], [address] Phone: [phone]   Fax:  [phone]  Physical Therapy Treatment  Patient Details  Name: Christine Washington MRN: [id number] Date of Birth: 01/23/1936 Referring Provider:  Silverio Decamp,*  Encounter Date: 07/06/2015      PT End of Session - 07/06/15 1442    Visit Number 9   Number of Visits 12   Date for PT Re-Evaluation 07/08/15   PT Start Time 6438   PT Stop Time 1531   PT Time Calculation (min) 53 min   Activity Tolerance Patient limited by fatigue;Patient limited by pain      Past Medical History  Diagnosis Date  . Hypertension   . Hyperlipidemia   . OA (osteoarthritis) of knee     left- Dr Latanya Maudlin  . Depression   . GERD (gastroesophageal reflux disease)     Dr Olevia Perches  . Diverticulosis   . Anxiety   . Cervical disc disease   . Diabetic neuropathy   . Type 2 diabetes mellitus   . H/O hiatal hernia   . Carpal tunnel syndrome on left   . OSA on CPAP   . History of wrist fracture     LEFT --  09-02-2013/   RESIDUAL DISCOMFORT  . Wears glasses   . History of uterine fibroid     Past Surgical History  Procedure Laterality Date  . Nasal sinus surgery  1990  . Breast reduction surgery  1995  . Total knee arthroplasty Bilateral RIGHT  10-19-2004/   LEFT  10-31-2007  . Knee arthroscopy w/ meniscectomy Left 01-03-2006  . Cardiovascular stress test  12-21-2007    NORMAL NUCLEAR STUDY/  NO ISCHEMIA/  EF 76%  . Transthoracic echocardiogram  12-21-2007    MILD TO MODERATE LAE/  EF 55%/  MILD AV CALCIFICATION WITH NO STENOSIS  . Abdominal hysterectomy  1970's    AND APPENDECTOMY  . Exploratory laparotomy/  lysis adhesions/  bilateral salpingoophorectomy  1980's  . Tonsillectomy  AGE 79  . Carpal tunnel release Left 04/03/2014    Procedure: LEFT CARPAL TUNNEL RELEASE;  Surgeon: Linna Hoff, MD;  Location: [facility];  Service:  Orthopedics;  Laterality: Left;  ANESTHESIA: LOCAL WITH IV SEDATION    There were no vitals filed for this visit.  Visit Diagnosis:  Muscle weakness (generalized)  Stiffness of hip joint, unspecified laterality  Pain of multiple sites      Subjective Assessment - 07/06/15 1442    Subjective "I have felt overwhelmingly tired lately".   Pt reports she still has some pain with making bed but believes time will work it out.    Currently in Pain? Yes   Pain Score 1    Pain Location Back   Pain Orientation Left;Right;Lower   Pain Descriptors / Indicators Dull   Aggravating Factors  making bed    Pain Relieving Factors rest, ice             [facility] PT Assessment - 07/06/15 0001    Assessment   Medical Diagnosis lumbar spondylosis   Onset Date/Surgical Date 05/25/15   Next MD Visit 07/15/15   Observation/Other Assessments   Focus on Therapeutic Outcomes (FOTO)  48% limited (goal of 47%)   Strength   Strength Assessment Site Hip   Right/Left Hip Right;Left   Right Hip Flexion 5/5   Right Hip Extension --  5-/5   Left Hip Flexion 5/5   Left  Hip Extension --  5-/5   Left Hip ABduction 5/5                     [facility] Adult PT Treatment/Exercise - 07/06/15 0001    Self-Care   Self-Care Posture;ADL's   ADL's Educated pt on body mechanics for vacuuming to help decrease LBP.  Pt needed frequent VC/ demo for her to be able to return demo of improved form (simulating vacuuming with a pool noodle).  Pt given VC to engage core and utilize legs in weight shifting pattern instead of flexing forward/twisting in the trunk.    Lumbar Exercises: Stretches   Passive Hamstring Stretch 2 reps;30 seconds  each leg   Lower Trunk Rotation 3 reps;10 seconds   Lumbar Exercises: Aerobic   Stationary Bike NuStep L 4: 5 min    Lumbar Exercises: Supine   Ab Set 10 reps;5 seconds   Bridge 5 reps  10 sec hold with ball squeeze   Bridge Limitations pt reported increased pain after 5 reps.  Stopped activity.    Lumbar Exercises: Sidelying   Clam 20 reps  VC to slow down   Modalities   Modalities Cryotherapy;Electrical Stimulation   Cryotherapy   Number Minutes Cryotherapy 15 Minutes   Cryotherapy Location Lumbar Spine   Type of Cryotherapy Ice pack   Electrical Stimulation   Electrical Stimulation Location bilateral SI/ piriformis area    Electrical Stimulation Action IFC   Electrical Stimulation Parameters to tolerance    Electrical Stimulation Goals Pain                  PT Short Term Goals - 06/16/15 1217    PT SHORT TERM GOAL #1   Title I with HEP ( 06/17/15)   Time 3   Period Weeks   Status On-going   PT SHORT TERM GOAL #2   Title increase strength bilat hips =/> 4+/5 to assist with standing activities ( 06/17/15)    Time 3   Period Weeks   Status Partially Met   PT SHORT TERM GOAL #3   Title report back pain decrease =/> 25% with walking ( 06/17/15)    Time 3   Period Weeks   Status Achieved           PT Long Term Goals - 07/06/15 1644    PT LONG TERM GOAL #1   Title I with advanced HEP ( 07/08/15)   Time 6   Period Weeks   Status On-going   PT LONG TERM GOAL #2   Title Report pain decrease =/> 75% in knees and back (07/08/15)    Time 6   Period Weeks   Status Achieved   PT LONG TERM GOAL #3   Title perform vacuuming without increased pain ( 07/08/15)    Time 6   Period Weeks   Status Partially Met   PT LONG TERM GOAL #4   Title improve FOTO =/< 47% impaired ( CK level ) ( 07/08/15)   Time 6   Period Weeks   Status On-going  scored 48% on 07/06/15               Plan - 07/06/15 1645    Clinical Impression Statement Pt overall reports reduction in symptoms, especially when compliant with her HEP.  Pt tolerated most exercises with minimal increase in LBP; LB discomfort decreased with use of estim and cryotherapy. Pt desires to hold therapy until she sees her MD on 07/15/15.  Pt will benefit from skilled therapeutic  intervention in order to improve on the following deficits Abnormal gait;Decreased endurance;Decreased activity tolerance;Difficulty walking;Decreased strength;Decreased mobility   Rehab Potential Good   PT Frequency 2x / week   PT Duration 6 weeks   PT Treatment/Interventions ADLs/Self Care Home Management;Electrical Stimulation;Cryotherapy;Ultrasound;Moist Heat;Gait training;Functional mobility training;Therapeutic activities;Therapeutic exercise;Neuromuscular re-education;Patient/family education;Manual techniques   PT Next Visit Plan Spoke to supervising PT; will hold therapy and await further advice from MD.    Consulted and Agree with Plan of Care Patient        Problem List Patient Active Problem List   Diagnosis Date Noted  . Spondylosis of lumbar region without myelopathy or radiculopathy 05/25/2015  . Cataract 09/05/2013  . Delayed sleep phase syndrome 11/09/2012  . Osteopenia 09/27/2012  . OSA (obstructive sleep apnea) 04/03/2012  . Diabetic neuropathy 12/28/2011  . FATIGUE 04/12/2010  . Diabetes type 2, controlled 06/02/2009  . SCIATICA 04/22/2009  . GASTRIC POLYP 03/18/2008  . Hyperlipidemia 01/17/2008  . THYROID NODULE 11/22/2007  . GERD 09/24/2007  . History of total knee arthroplasty 09/24/2007  . [place] DISEASE, CERVICAL 09/24/2007  . Major depression, chronic 07/16/2007  . COMMON MIGRAINE 07/11/2007  . HYPERTENSION, BENIGN ESSENTIAL 07/05/2007    Kerin Perna, PTA 07/06/2015 4:52 PM  [place], [place], [address] Phone: [phone]   Fax:  [phone]     PHYSICAL THERAPY DISCHARGE SUMMARY  Visits from Start of Care: 9  Current functional level related to goals / functional outcomes: Good progress with therapy - accomplishing some PT goals and progressing well with those not fully accomplished. Alece continued to struggle with fatigue and had some difficulty  consistently working on HEP   Remaining deficits: 5-/5 remaining LE weakness in some muscle groups.   Education / Equipment: HEP  Plan: Patient agrees to discharge.  Patient goals were partially met. Patient is being discharged due to being pleased with the current functional level.  ?????   Celyn P. Helene Kelp, PT, MPH 07/23/15 8:21 am

## 2015-07-08 ENCOUNTER — Encounter: Payer: Self-pay | Admitting: Physical Therapy

## 2015-07-10 ENCOUNTER — Ambulatory Visit: Payer: Medicare Other

## 2015-07-13 DIAGNOSIS — Z96653 Presence of artificial knee joint, bilateral: Secondary | ICD-10-CM | POA: Diagnosis not present

## 2015-07-14 ENCOUNTER — Ambulatory Visit: Payer: Medicare Other

## 2015-07-15 ENCOUNTER — Encounter: Payer: Self-pay | Admitting: Sports Medicine

## 2015-07-15 ENCOUNTER — Ambulatory Visit (INDEPENDENT_AMBULATORY_CARE_PROVIDER_SITE_OTHER): Payer: Medicare Other | Admitting: Sports Medicine

## 2015-07-15 VITALS — BP 132/73 | HR 88 | Ht 64.5 in | Wt 183.0 lb

## 2015-07-15 DIAGNOSIS — M7741 Metatarsalgia, right foot: Secondary | ICD-10-CM

## 2015-07-15 DIAGNOSIS — F322 Major depressive disorder, single episode, severe without psychotic features: Secondary | ICD-10-CM

## 2015-07-15 DIAGNOSIS — M47816 Spondylosis without myelopathy or radiculopathy, lumbar region: Secondary | ICD-10-CM | POA: Diagnosis not present

## 2015-07-15 DIAGNOSIS — F329 Major depressive disorder, single episode, unspecified: Secondary | ICD-10-CM

## 2015-07-15 NOTE — Patient Instructions (Signed)
Please purchase over-the-counter moleskin cut a piece in place between the toes.

## 2015-07-15 NOTE — Assessment & Plan Note (Signed)
Multilevel lumbar spondylosis with degenerative disc disease worse the L4-L5 level causing left-sided foraminal and mild central canal stenosis, there is also multilevel widespread facet arthritis. Doing extremely well with physical therapy, she will restart the meloxicam, and continue physical therapy at home. We may proceed with an L4-L5 intralaminar epidural in the future if no better.  Return in one month.

## 2015-07-15 NOTE — Assessment & Plan Note (Signed)
With painful callus under the second metatarsal head, metatarsal pad placed in her shoes, she is a good candidate for custom orthotics.

## 2015-07-15 NOTE — Assessment & Plan Note (Signed)
Fantastic response to increasing to 60 mg daily. No further changes needed.

## 2015-07-15 NOTE — Progress Notes (Signed)
  Subjective:    CC: Follow-up  HPI: Lumbar spondylosis: Improving significantly with physical therapy as well as Cymbalta. Has not yet plateaued. MRI results will be dictated below.  Depression: Has improved significantly with increasing to 60 mg of Cymbalta, only has mild poor energy but otherwise negative.  Foot pain: Right-sided, localized at the second and third metatarsal heads, plantar aspect. Moderate, persistent without radiation. Past medical history, Surgical history, Family history not pertinant except as noted below, Social history, Allergies, and medications have been entered into the medical record, reviewed, and no changes needed.   Review of Systems: No fevers, chills, night sweats, weight loss, chest pain, or shortness of breath.   Objective:    General: Well Developed, well nourished, and in no acute distress.  Neuro: Alert and oriented x3, extra-ocular muscles intact, sensation grossly intact.  HEENT: Normocephalic, atraumatic, pupils equal round reactive to light, neck supple, no masses, no lymphadenopathy, thyroid nonpalpable.  Skin: Warm and dry, no rashes. Cardiac: Regular rate and rhythm, no murmurs rubs or gallops, no lower extremity edema.  Respiratory: Clear to auscultation bilaterally. Not using accessory muscles, speaking in full sentences. Right Foot inspection and palpation reveals breakdown of the transverse arch and a drop of MT heads. Abnormal callus is present between the second, third, and fourth metatarsal heads. Hammer toes are present. TTP under the metatarsal heads.  MRI reviewed and shows multilevel protruding discs worse at the L4-L5 level with bilateral facet arthritis at this level.  Impression and Recommendations:    I spent 40 minutes with this patient, greater than 50% was face-to-face time counseling regarding the above diagnoses

## 2015-07-19 ENCOUNTER — Other Ambulatory Visit: Payer: Self-pay | Admitting: Family Medicine

## 2015-07-21 ENCOUNTER — Other Ambulatory Visit: Payer: Self-pay | Admitting: *Deleted

## 2015-07-21 ENCOUNTER — Telehealth: Payer: Self-pay | Admitting: Family Medicine

## 2015-07-21 MED ORDER — ALENDRONATE SODIUM 70 MG PO TABS: ORAL_TABLET | ORAL | Status: DC

## 2015-07-21 NOTE — Telephone Encounter (Signed)
Done

## 2015-07-21 NOTE — Telephone Encounter (Signed)
Pt called. She said for the past 3 wks she has been calling for refill on Alendronate sodium 70 mgs and has not gotten response.

## 2015-07-25 ENCOUNTER — Other Ambulatory Visit: Payer: Self-pay | Admitting: Family Medicine

## 2015-07-28 ENCOUNTER — Encounter: Payer: Self-pay | Admitting: Sports Medicine

## 2015-07-28 ENCOUNTER — Ambulatory Visit (INDEPENDENT_AMBULATORY_CARE_PROVIDER_SITE_OTHER): Payer: Medicare Other | Admitting: Sports Medicine

## 2015-07-28 VITALS — BP 144/79 | HR 96 | Ht 64.5 in | Wt 180.0 lb

## 2015-07-28 DIAGNOSIS — M7741 Metatarsalgia, right foot: Secondary | ICD-10-CM | POA: Diagnosis not present

## 2015-07-28 NOTE — Progress Notes (Signed)

## 2015-07-28 NOTE — Assessment & Plan Note (Signed)
Custom orthotics as above, avoid walking barefoot. Return to see me in one month.

## 2015-07-29 ENCOUNTER — Other Ambulatory Visit: Payer: Self-pay | Admitting: Family Medicine

## 2015-07-31 ENCOUNTER — Ambulatory Visit: Payer: Medicare Other | Admitting: Osteopathic Medicine

## 2015-08-03 ENCOUNTER — Encounter: Payer: Self-pay | Admitting: Family Medicine

## 2015-08-03 ENCOUNTER — Ambulatory Visit (INDEPENDENT_AMBULATORY_CARE_PROVIDER_SITE_OTHER): Payer: Medicare Other | Admitting: Family Medicine

## 2015-08-03 VITALS — BP 134/75 | HR 69 | Ht 64.5 in | Wt 180.0 lb

## 2015-08-03 DIAGNOSIS — E119 Type 2 diabetes mellitus without complications: Secondary | ICD-10-CM

## 2015-08-03 DIAGNOSIS — F411 Generalized anxiety disorder: Secondary | ICD-10-CM | POA: Diagnosis not present

## 2015-08-03 DIAGNOSIS — F329 Major depressive disorder, single episode, unspecified: Secondary | ICD-10-CM

## 2015-08-03 DIAGNOSIS — F322 Major depressive disorder, single episode, severe without psychotic features: Secondary | ICD-10-CM

## 2015-08-03 DIAGNOSIS — Z23 Encounter for immunization: Secondary | ICD-10-CM

## 2015-08-03 MED ORDER — METFORMIN HCL 1000 MG PO TABS: 1000.0000 mg | ORAL_TABLET | Freq: Two times a day (BID) | ORAL | Status: DC

## 2015-08-03 NOTE — Progress Notes (Signed)
   Subjective:    Patient ID: Christine Washington, female    DOB: 20-Sep-1936, 79 y.o.   MRN: [id number]  HPI Follow-up major depression-she's currently on Cymbalta 60 mg and had been doing well until recently. Lately she's been much more careful. She just feels lonely. Her sister passed away couple of years ago. She does complain of feeling down several days of the week and little interest or pleasure in doing things several days of the week. She is not sure if accidentally starte taking lower dose of Cymbalta. Did counseling with Josph Macho may.  She denies any thoughts of wanting to harm herself.  Has been taking an extra xanax some days, though usually use half a tab of  0.25mg  daily.    Diabetes - sugars have been running a little higher than metformin. Sugars have been running 162 at the highest.  Before was mostly running in the 130s.  Brought in home log for review.    Review of Systems     Objective:   Physical Exam  Constitutional: She is oriented to person, place, and time. She appears well-developed and well-nourished.  HENT:  Head: Normocephalic and atraumatic.  Cardiovascular: Normal rate, regular rhythm and normal heart sounds.   Pulmonary/Chest: Effort normal and breath sounds normal.  Neurological: She is alert and oriented to person, place, and time.  Skin: Skin is warm and dry.  Psychiatric: She has a normal mood and affect. Her behavior is normal.          Assessment & Plan:  Depression , chronic - PHQ 9 score of 4 today. Under fair control. She just feels very tearful and lone.  Discussed counseling or adjusting her Cymbalta. I want her to confirm her home dose first and make sure taking it consistently. Follow back up in 2 weeks. Will try to get her in with a local counselor.  Anxiety - reminded her to use her alprazolam sparingly. Reminded her about the fact that the medication can be very habit forming.  Diabetes - will increase metformin 1000mg  BID. Keep regular diabetic  f/u around 09/05/15.    Time spent 25 min, > 50% spent counsleing depression, anxiety and diabetes.

## 2015-08-03 NOTE — Patient Instructions (Signed)
Increase metformin 1000mg  twice a day.

## 2015-08-13 ENCOUNTER — Encounter: Payer: Self-pay | Admitting: Internal Medicine

## 2015-08-14 ENCOUNTER — Other Ambulatory Visit: Payer: Self-pay | Admitting: Family Medicine

## 2015-08-17 ENCOUNTER — Other Ambulatory Visit: Payer: Self-pay | Admitting: Family Medicine

## 2015-08-17 ENCOUNTER — Ambulatory Visit (INDEPENDENT_AMBULATORY_CARE_PROVIDER_SITE_OTHER): Payer: Medicare Other | Admitting: Family Medicine

## 2015-08-17 ENCOUNTER — Encounter: Payer: Self-pay | Admitting: Family Medicine

## 2015-08-17 VITALS — BP 137/62 | HR 89 | Ht 64.5 in | Wt 179.0 lb

## 2015-08-17 DIAGNOSIS — R1013 Epigastric pain: Secondary | ICD-10-CM

## 2015-08-17 DIAGNOSIS — F322 Major depressive disorder, single episode, severe without psychotic features: Secondary | ICD-10-CM | POA: Diagnosis not present

## 2015-08-17 DIAGNOSIS — E119 Type 2 diabetes mellitus without complications: Secondary | ICD-10-CM | POA: Diagnosis not present

## 2015-08-17 DIAGNOSIS — F329 Major depressive disorder, single episode, unspecified: Secondary | ICD-10-CM

## 2015-08-17 MED ORDER — DULOXETINE HCL 60 MG PO CPEP: 60.0000 mg | ORAL_CAPSULE | Freq: Every day | ORAL | Status: DC

## 2015-08-17 MED ORDER — ALENDRONATE SODIUM 70 MG PO TABS: ORAL_TABLET | ORAL | Status: DC

## 2015-08-17 MED ORDER — GLUCOSE BLOOD VI STRP: ORAL_STRIP | Status: DC

## 2015-08-17 NOTE — Progress Notes (Signed)
   Subjective:    Patient ID: Christine Washington, female    DOB: Nov 08, 1936, 79 y.o.   MRN: [id number]  HPI Depression -  phq-9=2(not difficult) doing well she stated that she went to church sunday and this was the first time she has been in a while. She feels like she is doing much better. She never saw Barnetta Chapel for counseling. Says the suite was on teh 2nd floor and she doesn't feel safe going up stairs so canceled her appt.   Diabetes-  Sugars have been well controlled in the alst 2 wees in the 120s fasting.   Still having  Some epigastric pain and bloating. Says feels like hre stomach gets really hard. She took an full course of probiotic and says didn't really help at all.   Review of Systems     Objective:   Physical Exam  Constitutional: She is oriented to person, place, and time. She appears well-developed and well-nourished.  HENT:  Head: Normocephalic and atraumatic.  Cardiovascular: Normal rate, regular rhythm and normal heart sounds.   Pulmonary/Chest: Effort normal and breath sounds normal.  Neurological: She is alert and oriented to person, place, and time.  Skin: Skin is warm and dry.  Psychiatric: She has a normal mood and affect. Her behavior is normal.          Assessment & Plan:  Depression -  Overall she is doing fantastic. PHQ 9 score of 2 today and that really is for low energy. Previous was 4. She rates her symptoms as not difficult. Continue current regimen. Refills sent for the next 6 months. I will see her back in 6 months to evaluate.   Diabetes-it sounds like her home blood sugars are going to be well-controlled. Follow-up at the end of next month for her next A1c.  Epigastric pain-will refer her back to for bradycardia , her regular GI physician. I think at this point she made need endoscopy.

## 2015-08-17 NOTE — Telephone Encounter (Signed)
Requested 90 day supply

## 2015-08-25 ENCOUNTER — Ambulatory Visit: Payer: Medicare Other | Admitting: Sports Medicine

## 2015-08-26 ENCOUNTER — Ambulatory Visit (INDEPENDENT_AMBULATORY_CARE_PROVIDER_SITE_OTHER): Payer: Medicare Other | Admitting: Sports Medicine

## 2015-08-26 ENCOUNTER — Encounter: Payer: Self-pay | Admitting: Sports Medicine

## 2015-08-26 DIAGNOSIS — M7741 Metatarsalgia, right foot: Secondary | ICD-10-CM

## 2015-08-26 MED ORDER — MELOXICAM 15 MG PO TABS: ORAL_TABLET | ORAL | Status: DC

## 2015-08-26 NOTE — Assessment & Plan Note (Signed)
Metatarsal pad moved forward, pain is predominantly over the callus, and not the metatarsal heads. Symptoms improved significantly with simply moving the metatarsal pad. She is also not taking her meloxicam, calling this in again.

## 2015-08-26 NOTE — Progress Notes (Signed)
  Subjective:    CC: follow-up  HPI: Christine Washington returns, she has metatarsalgia predominantly of the right foot, pain improved significantly with custom orthotics and metatarsal pads however she still gets some pain on regular and ablation weightbearing.  Past medical history, Surgical history, Family history not pertinant except as noted below, Social history, Allergies, and medications have been entered into the medical record, reviewed, and no changes needed.   Review of Systems: No fevers, chills, night sweats, weight loss, chest pain, or shortness of breath.   Objective:    General: Well Developed, well nourished, and in no acute distress.  Neuro: Alert and oriented x3, extra-ocular muscles intact, sensation grossly intact.  HEENT: Normocephalic, atraumatic, pupils equal round reactive to light, neck supple, no masses, no lymphadenopathy, thyroid nonpalpable.  Skin: Warm and dry, no rashes. Cardiac: Regular rate and rhythm, no murmurs rubs or gallops, no lower extremity edema.  Respiratory: Clear to auscultation bilaterally. Not using accessory muscles, speaking in full sentences. Right Foot: No visible erythema or swelling. Range of motion is full in all directions. Strength is 5/5 in all directions. No hallux valgus. No pes cavus or pes planus. No abnormal callus noted. No pain over the navicular prominence, or base of fifth metatarsal. No tenderness to palpation of the calcaneal insertion of plantar fascia. No pain at the Achilles insertion. No pain over the calcaneal bursa. No pain of the retrocalcaneal bursa. No tenderness to palpation over the tarsals, metatarsals, or phalanges. No hallux rigidus or limitus. Tender to palpation at the plantar aspect of the second and third tarsophalangeal joints, on further examination the pain is predominantly localized to the callus overlying the MTP rather than the MTP itself. No pain with compression of the metatarsal  heads. Neurovascularly intact distally.  We played with around placement of the metatarsal pads, they seem to be a bit too far back, on moving them forward approximately 1 cm her pain completely resolved with ambulation.  Impression and Recommendations:    I spent 25 minutes with this patient, greater than 50% was face-to-face time counseling regarding the above diagnoses

## 2015-08-31 ENCOUNTER — Other Ambulatory Visit: Payer: Self-pay | Admitting: Family Medicine

## 2015-09-02 ENCOUNTER — Ambulatory Visit (INDEPENDENT_AMBULATORY_CARE_PROVIDER_SITE_OTHER): Payer: Medicare Other | Admitting: Gastroenterology

## 2015-09-02 ENCOUNTER — Encounter: Payer: Self-pay | Admitting: Gastroenterology

## 2015-09-02 VITALS — BP 140/70 | HR 80 | Ht 64.5 in | Wt 179.2 lb

## 2015-09-02 DIAGNOSIS — R140 Abdominal distension (gaseous): Secondary | ICD-10-CM | POA: Diagnosis not present

## 2015-09-02 DIAGNOSIS — G8929 Other chronic pain: Secondary | ICD-10-CM | POA: Diagnosis not present

## 2015-09-02 DIAGNOSIS — R1013 Epigastric pain: Secondary | ICD-10-CM

## 2015-09-02 DIAGNOSIS — Z1211 Encounter for screening for malignant neoplasm of colon: Secondary | ICD-10-CM

## 2015-09-02 MED ORDER — NA SULFATE-K SULFATE-MG SULF 17.5-3.13-1.6 GM/177ML PO SOLN: 1.0000 | ORAL | Status: DC

## 2015-09-02 NOTE — Patient Instructions (Addendum)

## 2015-09-02 NOTE — Progress Notes (Signed)
Agree with assessment and plan. Will await EGD and colonoscopy results.

## 2015-09-02 NOTE — Progress Notes (Signed)
09/02/2015 Christine Washington [id number] [birth year]   HISTORY OF PRESENT ILLNESS:  This is a pleasant 79 year old female who is remotely known to Dr. Olevia Perches, last seen in 2008.  Her last colonoscopy was in 2003 at which time she was found to have diverticulosis only; repeat recommended in 10 years from that time.  EGD in 08/2007 showed fundic gland polyps.  She presents to our office today at the request of her PCP, Dr. Madilyn Fireman, for evaluation of epigastric abdominal pain and bloating.  She tells me that these symptoms have been present for years, but seem to be worse over the past few months.  Says that the pain is worse when she sneezes (has to hold her stomach) and when she bends over something the pain cuts her breath off at times.  Pain is unaffected by eating.  CT scan of the abdomen and pelvis in 04/2015 was unremarkable for any cause of symptoms.  Celiac labs negative.  TSH, CBC, CMP all ok.  Hpylori IgG negative in the past.  Has meloxicam listed on med list but says that she does not take it.  She has been on omeprazole 40 mg daily for several years for these symptoms, but does not seem to be working after all this time.  Denies any nausea, vomiting, bowel issues, dark or bloody stools, dysphagia, etc.  PMH includes HTN, HLD, OA, depression, anxiety, type 2 DM.   Past Medical History  Diagnosis Date  . Hypertension   . Hyperlipidemia   . OA (osteoarthritis) of knee     left- Dr Latanya Maudlin  . Depression   . GERD (gastroesophageal reflux disease)     Dr Olevia Perches  . Diverticulosis   . Anxiety   . Cervical disc disease   . Diabetic neuropathy   . Type 2 diabetes mellitus   . H/O hiatal hernia   . Carpal tunnel syndrome on left   . OSA on CPAP   . History of wrist fracture     LEFT --  09-02-2013/   RESIDUAL DISCOMFORT  . Wears glasses   . History of uterine fibroid    Past Surgical History  Procedure Laterality Date  . Nasal sinus surgery  1990  . Breast reduction surgery   1995  . Total knee arthroplasty Bilateral RIGHT  10-19-2004/   LEFT  10-31-2007  . Knee arthroscopy w/ meniscectomy Left 01-03-2006  . Cardiovascular stress test  12-21-2007    NORMAL NUCLEAR STUDY/  NO ISCHEMIA/  EF 76%  . Transthoracic echocardiogram  12-21-2007    MILD TO MODERATE LAE/  EF 55%/  MILD AV CALCIFICATION WITH NO STENOSIS  . Abdominal hysterectomy  1970's    AND APPENDECTOMY  . Exploratory laparotomy/  lysis adhesions/  bilateral salpingoophorectomy  1980's  . Tonsillectomy  AGE 65  . Carpal tunnel release Left 04/03/2014    Procedure: LEFT CARPAL TUNNEL RELEASE;  Surgeon: Linna Hoff, MD;  Location: [facility];  Service: Orthopedics;  Laterality: Left;  ANESTHESIA: LOCAL WITH IV SEDATION    reports that she has never smoked. She has never used smokeless tobacco. She reports that she does not drink alcohol or use illicit drugs. family history includes COPD in her sister; Cancer in her father; Diabetes in her mother; Hypertension in her father and mother; Multiple sclerosis in her sister; Renal cancer in her sister; Stroke in her mother; Stroke (age of onset: 1) in her father. There is no history of Colon  cancer. Allergies  Allergen Reactions  . Ace Inhibitors Cough    Lotensin=REACTION: cough  . Ambien [Zolpidem Tartrate] Other (See Comments)  . Bupropion Hcl Other (See Comments)    Wellbutrin=REACTION: hallucinations  . Lipitor [Atorvastatin] Other (See Comments)    memory  . Pioglitazone Other (See Comments)    Actos=REACTION: headache      Outpatient Encounter Prescriptions as of 09/02/2015  Medication Sig  . alendronate (FOSAMAX) 70 MG tablet TAKE 1 TABLET BY MOUTH EVERY 7 DAYS WITH A FULL GLASS OF WATER ON AN EMPTY STOMACH  . ALPRAZolam (XANAX) 0.25 MG tablet Take 1 tablet (0.25 mg total) by mouth daily as needed for anxiety.  Marland Kitchen amLODipine (NORVASC) 5 MG tablet TAKE 1 TABLET (5 MG TOTAL) BY MOUTH DAILY.  Marland Kitchen aspirin 81 MG tablet Take 81 mg by  mouth daily.    . Cholecalciferol (VITAMIN D3) 1000 UNITS CAPS Take 2 tablets by mouth.  . DULoxetine (CYMBALTA) 60 MG capsule Take 1 capsule (60 mg total) by mouth daily.  Marland Kitchen glucose blood test strip Use as instructed to check blood sugar daily. DX: E11.9  . losartan-hydrochlorothiazide (HYZAAR) 100-12.5 MG per tablet TAKE 1 TABLET BY MOUTH DAILY.  . Magnesium 250 MG TABS Take 1 tablet by mouth.  . meloxicam (MOBIC) 15 MG tablet One tab PO qAM with breakfast for 2 weeks, then daily prn pain.  . metFORMIN (GLUCOPHAGE) 1000 MG tablet Take 1 tablet (1,000 mg total) by mouth 2 (two) times daily with a meal.  . omeprazole (PRILOSEC) 40 MG capsule TAKE ONE CAPSULE BY MOUTH EVERY DAY  . pravastatin (PRAVACHOL) 80 MG tablet TAKE 1 TABLET BY MOUTH EVERY EVENING  . traZODone (DESYREL) 100 MG tablet Take 1-3 tablets (100-300 mg total) by mouth at bedtime.   No facility-administered encounter medications on file as of 09/02/2015.     REVIEW OF SYSTEMS  : All other systems reviewed and negative except where noted in the History of Present Illness.   PHYSICAL EXAM: BP 140/70 mmHg  Pulse 80  Ht 5' 4.5" (1.638 m)  Wt 179 lb 3.2 oz (81.285 kg)  BMI 30.30 kg/m2 General: Well developed white female in no acute distress Head: Normocephalic and atraumatic Eyes:  Sclerae anicteric, conjunctiva pink. Ears: Normal auditory acuity Lungs: Clear throughout to auscultation Heart: Regular rate and rhythm Abdomen: Soft, non-distended.  Low horizontal scar noted from previous hysterectomy.  Normal bowel sounds.  Epigastric TTP and tenderness over previous hysterectomy scar. Rectal:  Will be done at the time of colonoscopy. Musculoskeletal: Symmetrical with no gross deformities  Skin: No lesions on visible extremities Extremities: No edema.  Scars noted B/L on knees from TKR's. Neurological: Alert oriented x 4, grossly non-focal Psychological:  Alert and cooperative. Normal mood and affect  ASSESSMENT AND  PLAN: -Screening colonoscopy:  Last was 2003, without polyps.  Will schedule with Dr. Havery Moros at patient request. -Epigastric pain and bloating:  Chronic for several years.  EGD 08/2007 with only fundic gland polyps.  Recent CT scan unremarkable.  I am wondering if the abdominal pain is actually non-GI in origin.  Could consider changing PPI pending results of EGD.  *The risks, benefits, and alternatives to EGD and colonoscopy were discussed with the patient and she consents to proceed.   CC:  Hali Marry, *

## 2015-09-11 ENCOUNTER — Encounter: Payer: Self-pay | Admitting: Family Medicine

## 2015-09-11 ENCOUNTER — Ambulatory Visit (INDEPENDENT_AMBULATORY_CARE_PROVIDER_SITE_OTHER): Payer: Medicare Other | Admitting: Family Medicine

## 2015-09-11 VITALS — BP 156/69 | HR 98 | Wt 178.0 lb

## 2015-09-11 DIAGNOSIS — F322 Major depressive disorder, single episode, severe without psychotic features: Secondary | ICD-10-CM

## 2015-09-11 DIAGNOSIS — K5901 Slow transit constipation: Secondary | ICD-10-CM

## 2015-09-11 DIAGNOSIS — F329 Major depressive disorder, single episode, unspecified: Secondary | ICD-10-CM

## 2015-09-11 DIAGNOSIS — I10 Essential (primary) hypertension: Secondary | ICD-10-CM

## 2015-09-11 DIAGNOSIS — E119 Type 2 diabetes mellitus without complications: Secondary | ICD-10-CM

## 2015-09-11 LAB — POCT UA - MICROALBUMIN
Creatinine, POC: 200 mg/dL
Microalbumin Ur, POC: 30 mg/L

## 2015-09-11 LAB — POCT GLYCOSYLATED HEMOGLOBIN (HGB A1C): HEMOGLOBIN A1C: 6.3

## 2015-09-11 MED ORDER — DULOXETINE HCL 40 MG PO CPEP: 40.0000 mg | ORAL_CAPSULE | Freq: Two times a day (BID) | ORAL | Status: DC

## 2015-09-11 NOTE — Progress Notes (Signed)
   Subjective:    Patient ID: Christine Washington, female    DOB: Nov 08, 1936, 79 y.o.   MRN: [id number]  HPI Diabetes - no hypoglycemic events. No wounds or sores that are not healing well. No increased thirst or urination. Checking glucose at home. Taking medications as prescribed without any side effects.  Hypertension- Pt denies chest pain, SOB, dizziness, or heart palpitations.  Taking meds as directed w/o problems.  Denies medication side effects.    Says feels like hre bowels get back up intermittantly and then gets nauseated. Says take the colace daily.  She does have an edoscopy in 3 weeks for the persistent epigastric pain that she's also been experiencing..    she also wanted to discuss her depression today. She has been feeling a little bit more down lately. She wonders if maybe going up on the Cymbalta would be helpful.  Review of Systems     Objective:   Physical Exam  Constitutional: She is oriented to person, place, and time. She appears well-developed and well-nourished.  HENT:  Head: Normocephalic and atraumatic.  Cardiovascular: Normal rate, regular rhythm and normal heart sounds.   Pulmonary/Chest: Effort normal and breath sounds normal.  Neurological: She is alert and oriented to person, place, and time.  Skin: Skin is warm and dry.  Psychiatric: She has a normal mood and affect. Her behavior is normal.          Assessment & Plan:  DM- well controlled.  A1C is 6.3.  Continue current regimen.  F/U in 3 months.    HTN - well controlled. Continue current regimen.    constipation- we discussed increasing the Colace to twice a day. She was worried about the expense. We checked online and she can get a four-month supply if she's taking it twice a day if she gets the [facility] brand for $14. And they are running is still right now or if she buys 1 she can get the second one half off.  Depression-  We will increase to 40 mg twice a day which is a total of 80 mg. She's  currently taking 60 mg. She can call she's having any palms or side effects. Otherwise I will see her back in 3 months to make sure that she is doing well.

## 2015-09-12 LAB — BASIC METABOLIC PANEL
BUN: 19 mg/dL (ref 7–25)
CO2: 29 mmol/L (ref 20–31)
Calcium: 10.5 mg/dL — ABNORMAL HIGH (ref 8.6–10.4)
Chloride: 96 mmol/L — ABNORMAL LOW (ref 98–110)
Creat: 0.9 mg/dL (ref 0.60–0.93)
GLUCOSE: 81 mg/dL (ref 65–99)
POTASSIUM: 4.3 mmol/L (ref 3.5–5.3)
SODIUM: 138 mmol/L (ref 135–146)

## 2015-09-14 ENCOUNTER — Other Ambulatory Visit: Payer: Self-pay | Admitting: *Deleted

## 2015-09-14 DIAGNOSIS — R7989 Other specified abnormal findings of blood chemistry: Secondary | ICD-10-CM

## 2015-09-22 ENCOUNTER — Ambulatory Visit (INDEPENDENT_AMBULATORY_CARE_PROVIDER_SITE_OTHER): Payer: Medicare Other | Admitting: Sports Medicine

## 2015-09-22 ENCOUNTER — Encounter: Payer: Self-pay | Admitting: Sports Medicine

## 2015-09-22 ENCOUNTER — Ambulatory Visit: Payer: Medicare Other | Admitting: Sports Medicine

## 2015-09-22 VITALS — BP 149/66 | HR 100 | Wt 172.0 lb

## 2015-09-22 DIAGNOSIS — M7741 Metatarsalgia, right foot: Secondary | ICD-10-CM

## 2015-09-22 DIAGNOSIS — F329 Major depressive disorder, single episode, unspecified: Secondary | ICD-10-CM | POA: Diagnosis not present

## 2015-09-22 NOTE — Assessment & Plan Note (Signed)
Fantastic improvement with changing the position of her metatarsal pads, still having some pain at the fourth MTP, this was injected today, return in one month.

## 2015-09-22 NOTE — Progress Notes (Addendum)
  Subjective:    CC: Follow-up  HPI: Metatarsalgia: Christine Washington returns, we did a good job of controlling her symptoms with custom orthotics and a metatarsal pad, she's done very well but unfortunately is having a bit of increasing pain at the plantar aspect of the fourth metatarsophalangeal joint. Moderate, persistent, no radiation, desires interventional treatment today.  Depression: Needs a refill on medications, she was increased to 80 mg of Cymbalta but feels okay currently on 60 and would like to stay with 60. Needs a refill.  Past medical history, Surgical history, Family history not pertinant except as noted below, Social history, Allergies, and medications have been entered into the medical record, reviewed, and no changes needed.   Review of Systems: No fevers, chills, night sweats, weight loss, chest pain, or shortness of breath.   Objective:    General: Well Developed, well nourished, and in no acute distress.  Neuro: Alert and oriented x3, extra-ocular muscles intact, sensation grossly intact.  HEENT: Normocephalic, atraumatic, pupils equal round reactive to light, neck supple, no masses, no lymphadenopathy, thyroid nonpalpable.  Skin: Warm and dry, no rashes. Cardiac: Regular rate and rhythm, no murmurs rubs or gallops, no lower extremity edema.  Respiratory: Clear to auscultation bilaterally. Not using accessory muscles, speaking in full sentences. Right foot: Foot inspection and palpation reveals breakdown of the transverse arch and a drop of MT heads. Abnormal callus is present between the second, third, and fourth metatarsal heads. Hammer toes are present. TTP under the fourth metatarsal head  Procedure: Real-time Ultrasound Guided Injection of right fourth metatarsophalangeal joint Device: GE Logiq E  Verbal informed consent obtained.  Time-out conducted.  Noted no overlying erythema, induration, or other signs of local infection.  Skin prepped in a sterile fashion.    Local anesthesia: Topical Ethyl chloride.  With sterile technique and under real time ultrasound guidance:  25-gauge needle advanced into the joint, 0.5 mL kenalog 40, 1 mL lidocaine injected easily. Completed without difficulty  Pain immediately resolved suggesting accurate placement of the medication.  Advised to call if fevers/chills, erythema, induration, drainage, or persistent bleeding.  Images permanently stored and available for review in the ultrasound unit.  Impression: Technically successful ultrasound guided injection.  Impression and Recommendations:    I spent 40 minutes with this patient, greater than 50% was face-to-face time counseling regarding the above diagnoses, this time was separate from the time spent during the injection procedure.

## 2015-09-22 NOTE — Assessment & Plan Note (Signed)
Currently stable on 60 mg of Cymbalta, no changes needed, she tells me she does not want to go up to 80.

## 2015-09-23 ENCOUNTER — Ambulatory Visit: Payer: Medicare Other | Admitting: Family Medicine

## 2015-09-25 ENCOUNTER — Telehealth: Payer: Self-pay

## 2015-09-25 NOTE — Telephone Encounter (Signed)
Opened in error

## 2015-09-30 ENCOUNTER — Ambulatory Visit (INDEPENDENT_AMBULATORY_CARE_PROVIDER_SITE_OTHER): Payer: Medicare Other | Admitting: Podiatry

## 2015-09-30 ENCOUNTER — Encounter: Payer: Self-pay | Admitting: Podiatry

## 2015-09-30 DIAGNOSIS — L608 Other nail disorders: Secondary | ICD-10-CM

## 2015-09-30 DIAGNOSIS — E0842 Diabetes mellitus due to underlying condition with diabetic polyneuropathy: Secondary | ICD-10-CM

## 2015-09-30 DIAGNOSIS — Q828 Other specified congenital malformations of skin: Secondary | ICD-10-CM

## 2015-09-30 DIAGNOSIS — E1140 Type 2 diabetes mellitus with diabetic neuropathy, unspecified: Secondary | ICD-10-CM

## 2015-09-30 DIAGNOSIS — L609 Nail disorder, unspecified: Secondary | ICD-10-CM

## 2015-09-30 NOTE — Progress Notes (Signed)
Patient ID: Christine Washington, female   DOB: Jun 22, 1936, 79 y.o.   MRN: [id number]  Subjective: This patient presents for scheduled visit complaining of uncomfortable toenails and plantar keratoses right and left feet  Objective: Orientated 3 No open skin lesions bilaterally The toenails elongated and incurvated 6-10 Dictated plantar keratoses plantar second third MPJ right Atrophic fad MPJ bilaterally Patient has semi-custom orthotics with metatarsal raise placed too far forward  Assessment: Incurvated toenails 6-10 Porokeratosis 2 Diabetic with peripheral neuropathy Atrophic fad pad MPJ bilaterally  Plan: Debride nails 10 mechanically and electrically without any bleeding Debride plantar keratoses 2 right without any bleeding Replace metatarsal raise on custom insert improper location to offload second-fourth MPJ area right  Reappoint 3 months

## 2015-09-30 NOTE — Patient Instructions (Signed)
Diabetes and Foot Care Diabetes may cause you to have problems because of poor blood supply (circulation) to your feet and legs. This may cause the skin on your feet to become thinner, break easier, and heal more slowly. Your skin may become dry, and the skin may peel and crack. You may also have nerve damage in your legs and feet causing decreased feeling in them. You may not notice minor injuries to your feet that could lead to infections or more serious problems. Taking care of your feet is one of the most important things you can do for yourself.  HOME CARE INSTRUCTIONS  Wear shoes at all times, even in the house. Do not go barefoot. Bare feet are easily injured.  Check your feet daily for blisters, cuts, and redness. If you cannot see the bottom of your feet, use a mirror or ask someone for help.  Wash your feet with warm water (do not use hot water) and mild soap. Then pat your feet and the areas between your toes until they are completely dry. Do not soak your feet as this can dry your skin.  Apply a moisturizing lotion or petroleum jelly (that does not contain alcohol and is unscented) to the skin on your feet and to dry, brittle toenails. Do not apply lotion between your toes.  Trim your toenails straight across. Do not dig under them or around the cuticle. File the edges of your nails with an emery board or nail file.  Do not cut corns or calluses or try to remove them with medicine.  Wear clean socks or stockings every day. Make sure they are not too tight. Do not wear knee-high stockings since they may decrease blood flow to your legs.  Wear shoes that fit properly and have enough cushioning. To break in new shoes, wear them for just a few hours a day. This prevents you from injuring your feet. Always look in your shoes before you put them on to be sure there are no objects inside.  Do not cross your legs. This may decrease the blood flow to your feet.  If you find a minor scrape,  cut, or break in the skin on your feet, keep it and the skin around it clean and dry. These areas may be cleansed with mild soap and water. Do not cleanse the area with peroxide, alcohol, or iodine.  When you remove an adhesive bandage, be sure not to damage the skin around it.  If you have a wound, look at it several times a day to make sure it is healing.  Do not use heating pads or hot water bottles. They may burn your skin. If you have lost feeling in your feet or legs, you may not know it is happening until it is too late.  Make sure your health care provider performs a complete foot exam at least annually or more often if you have foot problems. Report any cuts, sores, or bruises to your health care provider immediately. SEEK MEDICAL CARE IF:   You have an injury that is not healing.  You have cuts or breaks in the skin.  You have an ingrown nail.  You notice redness on your legs or feet.  You feel burning or tingling in your legs or feet.  You have pain or cramps in your legs and feet.  Your legs or feet are numb.  Your feet always feel cold. SEEK IMMEDIATE MEDICAL CARE IF:   There is increasing redness,   swelling, or pain in or around a wound.  There is a red line that goes up your leg.  Pus is coming from a wound.  You develop a fever or as directed by your health care provider.  You notice a bad smell coming from an ulcer or wound.   This information is not intended to replace advice given to you by your health care provider. Make sure you discuss any questions you have with your health care provider.   Document Released: 12/02/2000 Document Revised: 08/07/2013 Document Reviewed: 05/14/2013 Elsevier Interactive Patient Education 2016 [employer]  

## 2015-10-06 ENCOUNTER — Ambulatory Visit (AMBULATORY_SURGERY_CENTER): Payer: Medicare Other | Admitting: Gastroenterology

## 2015-10-06 ENCOUNTER — Encounter: Payer: Self-pay | Admitting: Gastroenterology

## 2015-10-06 VITALS — BP 146/56 | HR 90 | Temp 98.2°F | Resp 15 | Ht 64.5 in | Wt 179.0 lb

## 2015-10-06 DIAGNOSIS — R1013 Epigastric pain: Secondary | ICD-10-CM | POA: Diagnosis present

## 2015-10-06 DIAGNOSIS — G8929 Other chronic pain: Secondary | ICD-10-CM | POA: Diagnosis not present

## 2015-10-06 DIAGNOSIS — D131 Benign neoplasm of stomach: Secondary | ICD-10-CM

## 2015-10-06 DIAGNOSIS — R140 Abdominal distension (gaseous): Secondary | ICD-10-CM

## 2015-10-06 DIAGNOSIS — D3A010 Benign carcinoid tumor of the duodenum: Secondary | ICD-10-CM | POA: Diagnosis not present

## 2015-10-06 DIAGNOSIS — K208 Other esophagitis: Secondary | ICD-10-CM | POA: Diagnosis not present

## 2015-10-06 DIAGNOSIS — K2950 Unspecified chronic gastritis without bleeding: Secondary | ICD-10-CM | POA: Diagnosis not present

## 2015-10-06 DIAGNOSIS — K317 Polyp of stomach and duodenum: Secondary | ICD-10-CM | POA: Diagnosis not present

## 2015-10-06 LAB — GLUCOSE, CAPILLARY
GLUCOSE-CAPILLARY: 89 mg/dL (ref 65–99)
Glucose-Capillary: 100 mg/dL — ABNORMAL HIGH (ref 65–99)

## 2015-10-06 MED ORDER — SODIUM CHLORIDE 0.9 % IV SOLN: 500.0000 mL | INTRAVENOUS | Status: DC

## 2015-10-06 NOTE — Progress Notes (Signed)
Report to PACU, RN, vss, BBS= Clear.  

## 2015-10-06 NOTE — Progress Notes (Signed)
Called to room to assist during endoscopic procedure.  Patient ID and intended procedure confirmed with present staff. Received instructions for my participation in the procedure from the performing physician.  

## 2015-10-06 NOTE — Patient Instructions (Signed)

## 2015-10-06 NOTE — Progress Notes (Signed)
Dental advisory given to patient 

## 2015-10-06 NOTE — Op Note (Signed)
[facility]  [address]. [place], [id number]   ENDOSCOPY PROCEDURE REPORT  PATIENT: Christine Washington, Christine Washington  MR#: [id number] BIRTHDATE: 31-Dec-1935 , 79  yrs. old GENDER: female ENDOSCOPIST: Yetta Flock, MD REFERRED BY: PROCEDURE DATE:  10/06/2015 PROCEDURE:  EGD w/ biopsy ASA CLASS:     Class II INDICATIONS:  dyspepsia and epigastric pain. MEDICATIONS: Propofol 200 mg IV TOPICAL ANESTHETIC:  DESCRIPTION OF PROCEDURE: After the risks benefits and alternatives of the procedure were thoroughly explained, informed consent was obtained.  The LB [id number] endoscope was introduced through the mouth and advanced to the second portion of the duodenum , Without limitations.  The instrument was slowly withdrawn as the mucosa was fully examined.  FINDINGS: The esophagus was normal.  The DH, GEJ were located 40cm from the incisors.  There was an extension of salmon colored mucosa up to 1cm from the GEJ/SCJ however this could have been due to fold in the distal esophagus.  Biopsies were taken to rule out Barrett's.  There were multiple small (all <70mm) gastric polyps noted in the gastric body and fundus.  3 of the largest polyps were removed with cold forceps for histology.  The remainder of the examined stomach appeared normal, biopsies were taken from the proximal and distal stomach to rule out H pylori.  The duodenal bulb had a small 3-49mm polyp which was removed with cold forceps. The remainder of the examined duodenum was normal, biopsies taken to rule out celiac disease.  Retroflexed views revealed no abnormalities.     The scope was then withdrawn from the patient and the procedure completed.  COMPLICATIONS: There were no immediate complications.  ENDOSCOPIC IMPRESSION: Irregular SCJ - biopsied, otherwise normal esophagus Multiple benign appearing gastric polyps, suspect fundic gland polyps, a few removed as above Normal appearing stomach otherwise,  biopsies obtained to rule out H pylori Benign appearing duodenal polyp, removed with cold forceps Normal appearing duodenum otherwise, biopsies taken to rule out celiac disease  RECOMMENDATIONS: Await pathology results Resume diet Resume medications Follow up in the clinic to discuss colon cancer screening    eSigned:  Yetta Flock, MD 10/06/2015 2:28 PM    CC: the patient  PATIENT NAME:  [name], [name] MR#: [id number]

## 2015-10-07 ENCOUNTER — Telehealth: Payer: Self-pay | Admitting: *Deleted

## 2015-10-07 NOTE — Telephone Encounter (Signed)
  Follow up Call-  Call back number 10/06/2015  Post procedure Call Back phone  # [phone]  Permission to leave phone message Yes     Patient questions:  Do you have a fever, pain , or abdominal swelling? No. Pain Score  0 *  Have you tolerated food without any problems? Yes.    Have you been able to return to your normal activities? Yes.    Do you have any questions about your discharge instructions: Diet   No. Medications  No. Follow up visit  No.  Do you have questions or concerns about your Care? No.  Actions: * If pain score is 4 or above: No action needed, pain <4.

## 2015-10-09 ENCOUNTER — Ambulatory Visit: Payer: Medicare Other | Admitting: Family Medicine

## 2015-10-12 ENCOUNTER — Encounter: Payer: Self-pay | Admitting: Family Medicine

## 2015-10-12 ENCOUNTER — Ambulatory Visit (INDEPENDENT_AMBULATORY_CARE_PROVIDER_SITE_OTHER): Payer: Medicare Other | Admitting: Family Medicine

## 2015-10-12 VITALS — BP 131/68 | HR 82 | Temp 98.3°F | Resp 18 | Wt 171.2 lb

## 2015-10-12 DIAGNOSIS — F418 Other specified anxiety disorders: Secondary | ICD-10-CM

## 2015-10-12 DIAGNOSIS — R2681 Unsteadiness on feet: Secondary | ICD-10-CM | POA: Diagnosis not present

## 2015-10-12 DIAGNOSIS — R413 Other amnesia: Secondary | ICD-10-CM | POA: Diagnosis not present

## 2015-10-12 MED ORDER — CITALOPRAM HYDROBROMIDE 20 MG PO TABS: 20.0000 mg | ORAL_TABLET | Freq: Every day | ORAL | Status: DC

## 2015-10-12 NOTE — Progress Notes (Signed)
   Subjective:    Patient ID: Christine Washington, female    DOB: Aug 05, 1936, 79 y.o.   MRN: [id number]  HPI patient here today for further memory testing. She was seen recently for Medicare wellness exam and scored slightly elevated on her 6 CIT exam. She is more concerned about her anxiety.   Her podiatrist had recommended an assistive device because she has been more off balance lately. Her son got her 4 pronged cane.  She has been falling more frequently.  We talked about her trying to get a video for Wallace but she was not able to find one.  Says when she gets upset or agitated she get SOB, but not CP.  sys it last for a few minutes until she calms down.  She is concerned because she was told by the pharmacy that they may no longer cover her Cymbalta. She was confused about this.   Review of Systems     Objective:   Physical Exam  Constitutional: She is oriented to person, place, and time. She appears well-developed and well-nourished.  HENT:  Head: Normocephalic and atraumatic.  Cardiovascular: Normal rate, regular rhythm and normal heart sounds.   Pulmonary/Chest: Effort normal and breath sounds normal.  Neurological: She is alert and oriented to person, place, and time.  Skin: Skin is warm and dry.  Psychiatric: She has a normal mood and affect. Her behavior is normal.          Assessment & Plan:  Changes in memory- MMSE score of 27/30.  This is a passing score based on her age as well as completing high school. Passing scores 27 out of 30. She did have some difficulty with the clock drawing know. I asked her to set the hands to 3:30 and she put the base of the hands at the 12 in both hands pointed to the 3 and 4. This indicates that she may have some underlying memory change but she is also struggling a lot with her mood so some of this could be related. I like to really focus on getting her depression and anxiety under better control. See note below.  Depression/anxiety-we did  call the pharmacy and confirm that they are not going to cover the Cymbalta at all. They will cover citalopram. Will send new prescription over to the pharmacy. Follow-up in one month to make sure that she's doing well on the new medication.   Balance and gait instability-given her handout with some information on some DVDs that she can use at home for beginning tai chi and balance and strengthening. Did encourage her to try using the cane which she has been very hesitant to do especially if she's going to be out for a while for shopping. She is on a bisphosphonate for her osteoporosis.

## 2015-10-13 ENCOUNTER — Other Ambulatory Visit: Payer: Self-pay | Admitting: Family Medicine

## 2015-10-16 ENCOUNTER — Encounter: Payer: Self-pay | Admitting: *Deleted

## 2015-10-16 ENCOUNTER — Other Ambulatory Visit: Payer: Self-pay | Admitting: *Deleted

## 2015-10-16 ENCOUNTER — Encounter: Payer: Self-pay | Admitting: Gastroenterology

## 2015-10-16 DIAGNOSIS — C7A029 Malignant carcinoid tumor of the large intestine, unspecified portion: Secondary | ICD-10-CM

## 2015-10-19 ENCOUNTER — Other Ambulatory Visit: Payer: Medicare Other

## 2015-10-19 ENCOUNTER — Telehealth: Payer: Self-pay | Admitting: Gastroenterology

## 2015-10-19 DIAGNOSIS — C7A029 Malignant carcinoid tumor of the large intestine, unspecified portion: Secondary | ICD-10-CM

## 2015-10-19 NOTE — Telephone Encounter (Signed)
Patient aware that her apt is 10-22-15 at 12:45 at [facility]. She is aware that she will need to be there for a few hours so she can drink the contrast and then have the scan. Patient to call back with any question or concerns.

## 2015-10-19 NOTE — Telephone Encounter (Signed)
Dr. Havery Moros patient states that she can not tolerate drinking the oral contrast. She has a CT scan of the body scheduled for 10-21-15. Please advise.

## 2015-10-19 NOTE — Telephone Encounter (Signed)
Being diabetic should not limit her from drinking contrast. Has she tried drinking contrast before, or had a prior reaction to it? If not I don't see why she should be limited in drinking it. If she cannot tolerate it from a prior attempt, we may need to discuss alternative options with radiology. Thanks

## 2015-10-19 NOTE — Telephone Encounter (Signed)
Great - thanks

## 2015-10-19 NOTE — Telephone Encounter (Signed)
She states that she has tried the contrast before and was never able to finish drinking it all. Spoke to [place] over at [facility] and she said that they can give her a smaller dose of the contrast in water. Apt rescheduled to 10-22-15 at 12:45.

## 2015-10-21 ENCOUNTER — Inpatient Hospital Stay: Admission: RE | Admit: 2015-10-21 | Payer: Medicare Other | Source: Ambulatory Visit

## 2015-10-22 ENCOUNTER — Telehealth: Payer: Self-pay | Admitting: Gastroenterology

## 2015-10-22 ENCOUNTER — Ambulatory Visit (INDEPENDENT_AMBULATORY_CARE_PROVIDER_SITE_OTHER)
Admission: RE | Admit: 2015-10-22 | Discharge: 2015-10-22 | Disposition: A | Discharge status: Home / Self Care | Payer: Medicare Other | Source: Ambulatory Visit | Attending: Gastroenterology | Admitting: Gastroenterology

## 2015-10-22 DIAGNOSIS — C7A029 Malignant carcinoid tumor of the large intestine, unspecified portion: Secondary | ICD-10-CM

## 2015-10-22 DIAGNOSIS — R1013 Epigastric pain: Secondary | ICD-10-CM | POA: Diagnosis not present

## 2015-10-22 LAB — CHROMOGRANIN A: CHROMOGRANIN A: 97 ng/mL — AB (ref <=15)

## 2015-10-22 MED ORDER — IOHEXOL 300 MG/ML  SOLN
100.0000 mL | Freq: Once | INTRAMUSCULAR | Status: AC | PRN
  Administered 2015-10-22: 100 mL via INTRAVENOUS

## 2015-10-22 NOTE — Telephone Encounter (Signed)
Patient notified to hold her Metformin this AM and for 48 hours. She can take other medications with sip of water.

## 2015-10-23 ENCOUNTER — Ambulatory Visit: Payer: Medicare Other | Admitting: Sports Medicine

## 2015-10-24 ENCOUNTER — Other Ambulatory Visit: Payer: Self-pay | Admitting: Family Medicine

## 2015-10-27 ENCOUNTER — Other Ambulatory Visit: Payer: Self-pay

## 2015-10-27 ENCOUNTER — Telehealth: Payer: Self-pay

## 2015-10-27 DIAGNOSIS — C170 Malignant neoplasm of duodenum: Secondary | ICD-10-CM

## 2015-10-27 NOTE — Telephone Encounter (Signed)
EUS scheduled for 11/05/15 instructions mailed to the home and message left for pt to call back to discuss the information over the phone.

## 2015-10-27 NOTE — Telephone Encounter (Signed)
Mailbox is full on cell and message left on home number

## 2015-10-27 NOTE — Telephone Encounter (Signed)
-----   Message from Milus Banister, MD sent at 10/23/2015  8:25 AM EDT ----- Regarding: RE: carcinoid question Richardson Landry, I think upper EUS is a good next step.  I'll plan to examine endoscopically and then check with EUS, biopsy or polypectomy if any remaining abnormality. Can you give her a heads up that we'll call to schedule. thanks  Kyler Germer, She needs upper EUS, radial +/- linear, MAC+, next available EUS Thursday for duodenal carcinoid.  Thanks  dj  ----- Message -----    From: Manus Gunning, MD    Sent: 10/22/2015   4:52 PM      To: Milus Banister, MD Subject: RE: carcinoid question                         Melissa Montane,  Just wanted to follow up on this case. CT shows no overt evidence of carcinoid or metastatic disease.   Do you think an EUS is reasonable at this point? While it was small, I don't know how much depth it had, and unclear how much if any remains.   Thanks much, Richardson Landry   ----- Message -----    From: Milus Banister, MD    Sent: 10/15/2015   7:43 AM      To: Manus Gunning, MD Subject: RE: carcinoid question                         Richardson Landry, I looked at your EGD and path results.  Pretty small, but data show that even <1cm small bowel carcinoids can metastasize.  Normal recommendation is for small bowel carcinoids, irregardless of size, to be removed surgically, allowing for regional nodal sampling.  But she is 30 and so that does not seem reasonable.    Would check CT scan for associated notes, spread.  If that is neg then I think EUS is a good next step to check the site for any residual.  May be hard to find actually but if I see anything I can check for associated underlying mass, can repeat snare/polypectomy as well to try to get more obviously clear margins.  Thanks, let me know what the scan shows.  DJ   ----- Message -----    From: Manus Gunning, MD    Sent: 10/14/2015   4:32 PM      To: Milus Banister, MD Subject: carcinoid question                              Linna Hoff wanted to get your thought on this if you have a minute.   79 y/o lady with abdominal pain, saw a 3-64mm duodenal bulb polyp and removed with forceps, c/w grade I carcinoid. Otherwise the exam was normal. I took a few bites and thought I got it all, but path is saying "positive margins". It was a very small lesion. CT earlier this year for this pain was negative. I think it's an incidental finding. I will do the hormonal workup but was curious if you thought EUS was needed to ensure I got the whole thing, or if just a repeat EGD would be okay.  Thanks as always for your opinion.  Richardson Landry

## 2015-10-29 ENCOUNTER — Other Ambulatory Visit: Payer: Self-pay

## 2015-10-29 DIAGNOSIS — D3A00 Benign carcinoid tumor of unspecified site: Secondary | ICD-10-CM

## 2015-10-29 NOTE — Telephone Encounter (Signed)
EUS scheduled, pt instructed and medications reviewed.  Patient instructions mailed to home.  Patient to call with any questions or concerns.  

## 2015-11-03 ENCOUNTER — Encounter (HOSPITAL_COMMUNITY): Payer: Self-pay | Admitting: *Deleted

## 2015-11-05 ENCOUNTER — Ambulatory Visit (HOSPITAL_COMMUNITY): Payer: Medicare Other | Admitting: Anesthesiology

## 2015-11-05 ENCOUNTER — Ambulatory Visit (HOSPITAL_COMMUNITY)
Admission: RE | Admit: 2015-11-05 | Discharge: 2015-11-05 | Disposition: A | Discharge status: Home / Self Care | Payer: Medicare Other | Source: Ambulatory Visit | Attending: Gastroenterology | Admitting: Gastroenterology

## 2015-11-05 ENCOUNTER — Encounter (HOSPITAL_COMMUNITY)
Admission: RE | Disposition: A | Discharge status: Home / Self Care | Payer: Self-pay | Source: Ambulatory Visit | Attending: Gastroenterology

## 2015-11-05 ENCOUNTER — Encounter (HOSPITAL_COMMUNITY): Payer: Self-pay

## 2015-11-05 DIAGNOSIS — Z9181 History of falling: Secondary | ICD-10-CM | POA: Diagnosis not present

## 2015-11-05 DIAGNOSIS — F419 Anxiety disorder, unspecified: Secondary | ICD-10-CM | POA: Diagnosis not present

## 2015-11-05 DIAGNOSIS — R269 Unspecified abnormalities of gait and mobility: Secondary | ICD-10-CM | POA: Diagnosis not present

## 2015-11-05 DIAGNOSIS — C170 Malignant neoplasm of duodenum: Secondary | ICD-10-CM

## 2015-11-05 DIAGNOSIS — F329 Major depressive disorder, single episode, unspecified: Secondary | ICD-10-CM | POA: Insufficient documentation

## 2015-11-05 DIAGNOSIS — D3A010 Benign carcinoid tumor of the duodenum: Secondary | ICD-10-CM | POA: Diagnosis not present

## 2015-11-05 DIAGNOSIS — Z79899 Other long term (current) drug therapy: Secondary | ICD-10-CM | POA: Diagnosis not present

## 2015-11-05 DIAGNOSIS — M810 Age-related osteoporosis without current pathological fracture: Secondary | ICD-10-CM | POA: Diagnosis not present

## 2015-11-05 DIAGNOSIS — Z85060 Personal history of malignant carcinoid tumor of small intestine: Secondary | ICD-10-CM | POA: Diagnosis not present

## 2015-11-05 DIAGNOSIS — Z08 Encounter for follow-up examination after completed treatment for malignant neoplasm: Secondary | ICD-10-CM | POA: Insufficient documentation

## 2015-11-05 HISTORY — PX: EUS: SHX5427

## 2015-11-05 LAB — GLUCOSE, CAPILLARY: Glucose-Capillary: 110 mg/dL — ABNORMAL HIGH (ref 65–99)

## 2015-11-05 SURGERY — UPPER ENDOSCOPIC ULTRASOUND (EUS) LINEAR
Anesthesia: Monitor Anesthesia Care

## 2015-11-05 MED ORDER — LACTATED RINGERS IV SOLN
INTRAVENOUS | Status: DC | PRN
  Administered 2015-11-05: 09:00:00 via INTRAVENOUS

## 2015-11-05 MED ORDER — PROPOFOL 500 MG/50ML IV EMUL
INTRAVENOUS | Status: DC | PRN
  Administered 2015-11-05: 125 ug/kg/min via INTRAVENOUS

## 2015-11-05 MED ORDER — PROPOFOL 10 MG/ML IV BOLUS
INTRAVENOUS | Status: DC | PRN
  Administered 2015-11-05 (×2): 20 mg via INTRAVENOUS

## 2015-11-05 MED ORDER — SODIUM CHLORIDE 0.9 % IV SOLN: INTRAVENOUS | Status: DC

## 2015-11-05 MED ORDER — PROPOFOL 10 MG/ML IV BOLUS
INTRAVENOUS | Status: AC
  Filled 2015-11-05: qty 60

## 2015-11-05 NOTE — Anesthesia Preprocedure Evaluation (Signed)
Anesthesia Evaluation  Patient identified by MRN, date of birth, ID band Patient awake    Reviewed: Allergy & Precautions, H&P , NPO status , Patient's Chart, lab work & pertinent test results  Airway Mallampati: II  TM Distance: >3 FB Neck ROM: Full    Dental  (+) Teeth Intact, Caps, Dental Advisory Given   Pulmonary sleep apnea and Continuous Positive Airway Pressure Ventilation ,    Pulmonary exam normal breath sounds clear to auscultation       Cardiovascular hypertension, Pt. on medications Normal cardiovascular exam Rhythm:Regular Rate:Normal     Neuro/Psych  Headaches, Anxiety Depression negative psych ROS   GI/Hepatic Neg liver ROS, hiatal hernia, GERD  Medicated,  Endo/Other  diabetes, Type 2, Oral Hypoglycemic Agents  Renal/GU negative Renal ROS  negative genitourinary   Musculoskeletal negative musculoskeletal ROS (+)   Abdominal   Peds  Hematology negative hematology ROS (+)   Anesthesia Other Findings   Reproductive/Obstetrics                             Anesthesia Physical  Anesthesia Plan  ASA: II  Anesthesia Plan: MAC   Post-op Pain Management:    Induction:   Airway Management Planned: Simple Face Mask  Additional Equipment:   Intra-op Plan:   Post-operative Plan:   Informed Consent:   Dental advisory given  Plan Discussed with: CRNA  Anesthesia Plan Comments:         Anesthesia Quick Evaluation

## 2015-11-05 NOTE — Op Note (Signed)
[place], [id number]   ENDOSCOPIC ULTRASOUND PROCEDURE REPORT  PATIENT: Christine Washington, Christine Washington  MR#: [id number] BIRTHDATE: Jul 12, 1936  GENDER: female ENDOSCOPIST: Milus Banister, MD REFERRED BY:  Jolly Mango, MD PROCEDURE DATE:  11/05/2015 PROCEDURE:   Upper EUS ASA CLASS:      Class II INDICATIONS:   1.  EGD Dr.  Havery Moros found, removed 34mm carcinoid from duodenal bulb.  CT scan recent showed was essentially normal..  MEDICATIONS: Monitored anesthesia care  DESCRIPTION OF PROCEDURE:   After the risks benefits and alternatives of the procedure were  explained, informed consent was obtained. The patient was then placed in the left, lateral, decubitus postion and IV sedation was administered. Throughout the procedure, the patients blood pressure, pulse and oxygen saturations were monitored continuously.  Under direct visualization, the PENTAX EUS SCOPE  endoscope was introduced through the mouth  and advanced to the second portion of the duodenum .  Water was used as necessary to provide an acoustic interface.  Upon completion of the imaging, water was removed and the patient was sent to the recovery room in satisfactory condition.  Endoscopic findings (with standard gastroscope and radial echoendoscope): 1. Normal UGI tract. 2. Duodenal bulb closely evaluated and appeared normal.  EUS findings: 1. Duodenal bulb showed normal echolayering by EUS. No submucosal masses, tumors and no nearby adenopathy. 2. Pancreatic parenchyma was normal. 3. CBD was normal, non-dilated 4. Limited views of liver, spleen, portal and splenic vessels  were all normal.  ENDOSCOPIC IMPRESSION: No sign of residual, recurrent duodenal bulb carcinoid endoscopically or with EUS views.  RECOMMENDATIONS: Surveillance EGD in 12 months.  _______________________________ eSigned:  Milus Banister, MD 11/05/2015 10:49 AM

## 2015-11-05 NOTE — H&P (View-Only) (Signed)
   Subjective:    Patient ID: Christine Washington, female    DOB: 07/19/1936, 79 y.o.   MRN: [id number]  HPI patient here today for further memory testing. She was seen recently for Medicare wellness exam and scored slightly elevated on her 6 CIT exam. She is more concerned about her anxiety.   Her podiatrist had recommended an assistive device because she has been more off balance lately. Her son got her 4 pronged cane.  She has been falling more frequently.  We talked about her trying to get a video for Tai Chi but she was not able to find one.  Says when she gets upset or agitated she get SOB, but not CP.  sys it last for a few minutes until she calms down.  She is concerned because she was told by the pharmacy that they may no longer cover her Cymbalta. She was confused about this.   Review of Systems     Objective:   Physical Exam  Constitutional: She is oriented to person, place, and time. She appears well-developed and well-nourished.  HENT:  Head: Normocephalic and atraumatic.  Cardiovascular: Normal rate, regular rhythm and normal heart sounds.   Pulmonary/Chest: Effort normal and breath sounds normal.  Neurological: She is alert and oriented to person, place, and time.  Skin: Skin is warm and dry.  Psychiatric: She has a normal mood and affect. Her behavior is normal.          Assessment & Plan:  Changes in memory- MMSE score of 27/30.  This is a passing score based on her age as well as completing high school. Passing scores 27 out of 30. She did have some difficulty with the clock drawing know. I asked her to set the hands to 3:30 and she put the base of the hands at the 12 in both hands pointed to the 3 and 4. This indicates that she may have some underlying memory change but she is also struggling a lot with her mood so some of this could be related. I like to really focus on getting her depression and anxiety under better control. See note below.  Depression/anxiety-we did  call the pharmacy and confirm that they are not going to cover the Cymbalta at all. They will cover citalopram. Will send new prescription over to the pharmacy. Follow-up in one month to make sure that she's doing well on the new medication.   Balance and gait instability-given her handout with some information on some DVDs that she can use at home for beginning tai chi and balance and strengthening. Did encourage her to try using the cane which she has been very hesitant to do especially if she's going to be out for a while for shopping. She is on a bisphosphonate for her osteoporosis.   

## 2015-11-05 NOTE — Discharge Instructions (Signed)
YOU HAD AN ENDOSCOPIC PROCEDURE TODAY: Refer to the procedure report that was given to you for any specific questions about what was found during the examination.  If the procedure report does not answer your questions, please call your gastroenterologist to clarify.  YOU SHOULD EXPECT: Some feelings of bloating in the abdomen. Passage of more gas than usual.  Walking can help get rid of the air that was put into your GI tract during the procedure and reduce the bloating. If you had a lower endoscopy (such as a colonoscopy or flexible sigmoidoscopy) you may notice spotting of blood in your stool or on the toilet paper.   DIET: Your first meal following the procedure should be a light meal and then it is ok to progress to your normal diet.  A half-sandwich or bowl of soup is an example of a good first meal.  Heavy or fried foods are harder to digest and may make you feel nasueas or bloated.  Drink plenty of fluids but you should avoid alcoholic beverages for 24 hours.  ACTIVITY: Your care partner should take you home directly after the procedure.  You should plan to take it easy, moving slowly for the rest of the day.  You can resume normal activity the day after the procedure however you should NOT DRIVE or use heavy machinery for 24 hours (because of the sedation medicines used during the test).    SYMPTOMS TO REPORT IMMEDIATELY  A gastroenterologist can be reached at any hour.  Please call your doctor's office for any of the following symptoms:   Following lower endoscopy (colonoscopy, flexible sigmoidoscopy)  Excessive amounts of blood in the stool  Significant tenderness, worsening of abdominal pains  Swelling of the abdomen that is new, acute  Fever of 100 or higher  Following upper endoscopy (EGD, EUS, ERCP)  Vomiting of blood or coffee ground material  New, significant abdominal pain  New, significant chest pain or pain under the shoulder blades  Painful or persistently difficult  swallowing  New shortness of breath  Black, tarry-looking stools  FOLLOW UP: If any biopsies were taken you will be contacted by phone or by letter within the next 1-3 weeks.  Call your gastroenterologist if you have not heard about the biopsies in 3 weeks.  Please also call your gastroenterologist's office with any specific questions about appointments or follow up tests.   Moderate Conscious Sedation, Adult, Care After Refer to this sheet in the next few weeks. These instructions provide you with information on caring for yourself after your procedure. Your health care provider may also give you more specific instructions. Your treatment has been planned according to current medical practices, but problems sometimes occur. Call your health care provider if you have any problems or questions after your procedure. WHAT TO EXPECT AFTER THE PROCEDURE  After your procedure:  You may feel sleepy, clumsy, and have poor balance for several hours.  Vomiting may occur if you eat too soon after the procedure. HOME CARE INSTRUCTIONS  Do not participate in any activities where you could become injured for at least 24 hours. Do not:  Drive.  Swim.  Ride a bicycle.  Operate heavy machinery.  Cook.  Use power tools.  Climb ladders.  Work from a high place.  Do not make important decisions or sign legal documents until you are improved.  If you vomit, drink water, juice, or soup when you can drink without vomiting. Make sure you have little or no nausea  before eating solid foods.  Only take over-the-counter or prescription medicines for pain, discomfort, or fever as directed by your health care provider.  Make sure you and your family fully understand everything about the medicines given to you, including what side effects may occur.  You should not drink alcohol, take sleeping pills, or take medicines that cause drowsiness for at least 24 hours.  If you smoke, do not smoke without  supervision.  If you are feeling better, you may resume normal activities 24 hours after you were sedated.  Keep all appointments with your health care provider. SEEK MEDICAL CARE IF:  Your skin is pale or bluish in color.  You continue to feel nauseous or vomit.  Your pain is getting worse and is not helped by medicine.  You have bleeding or swelling.  You are still sleepy or feeling clumsy after 24 hours. SEEK IMMEDIATE MEDICAL CARE IF:  You develop a rash.  You have difficulty breathing.  You develop any type of allergic problem.  You have a fever. MAKE SURE YOU:  Understand these instructions.  Will watch your condition.  Will get help right away if you are not doing well or get worse.   This information is not intended to replace advice given to you by your health care provider. Make sure you discuss any questions you have with your health care provider.   Document Released: 09/25/2013 Document Revised: 12/26/2014 Document Reviewed: 09/25/2013 Elsevier Interactive Patient Education [employer].

## 2015-11-05 NOTE — Transfer of Care (Signed)
Immediate Anesthesia Transfer of Care Note  Patient: Christine Washington  Procedure(s) Performed: Procedure(s): UPPER ENDOSCOPIC ULTRASOUND (EUS) LINEAR (N/A)  Patient Location: PACU and Endoscopy Unit  Anesthesia Type:MAC  Level of Consciousness: awake, alert , oriented and patient cooperative  Airway & Oxygen Therapy: Patient Spontanous Breathing and Patient connected to nasal cannula oxygen  Post-op Assessment: Report given to RN  Post vital signs: Reviewed and stable  Last Vitals:  Filed Vitals:   11/05/15 0846  BP: 150/60  Pulse: 70  Temp: 36.7 C  Resp: 15    Complications: No apparent anesthesia complications

## 2015-11-05 NOTE — Interval H&P Note (Signed)
History and Physical Interval Note:  11/05/2015 10:14 AM  Christine Washington  has presented today for surgery, with the diagnosis of duodenal carcinoid  The various methods of treatment have been discussed with the patient and family. After consideration of risks, benefits and other options for treatment, the patient has consented to  Procedure(s): UPPER ENDOSCOPIC ULTRASOUND (EUS) LINEAR (N/A) as a surgical intervention .  The patient's history has been reviewed, patient examined, no change in status, stable for surgery.  I have reviewed the patient's chart and labs.  Questions were answered to the patient's satisfaction.     Milus Banister

## 2015-11-05 NOTE — Anesthesia Postprocedure Evaluation (Signed)
  Anesthesia Post-op Note  Patient: Christine Washington  Procedure(s) Performed: Procedure(s) (LRB): UPPER ENDOSCOPIC ULTRASOUND (EUS) LINEAR (N/A)  Patient Location: PACU  Anesthesia Type: MAC  Level of Consciousness: awake and alert   Airway and Oxygen Therapy: Patient Spontanous Breathing  Post-op Pain: mild  Post-op Assessment: Post-op Vital signs reviewed, Patient's Cardiovascular Status Stable, Respiratory Function Stable, Patent Airway and No signs of Nausea or vomiting  Last Vitals:  Filed Vitals:   11/05/15 1100  BP: 142/58  Pulse: 67  Temp:   Resp: 15    Post-op Vital Signs: stable   Complications: No apparent anesthesia complications

## 2015-11-09 ENCOUNTER — Encounter (HOSPITAL_COMMUNITY): Payer: Self-pay | Admitting: Gastroenterology

## 2015-11-09 ENCOUNTER — Other Ambulatory Visit: Payer: Self-pay | Admitting: Family Medicine

## 2015-11-09 DIAGNOSIS — E119 Type 2 diabetes mellitus without complications: Secondary | ICD-10-CM | POA: Diagnosis not present

## 2015-11-09 DIAGNOSIS — H52203 Unspecified astigmatism, bilateral: Secondary | ICD-10-CM | POA: Diagnosis not present

## 2015-11-09 DIAGNOSIS — H04123 Dry eye syndrome of bilateral lacrimal glands: Secondary | ICD-10-CM | POA: Diagnosis not present

## 2015-11-09 DIAGNOSIS — Z961 Presence of intraocular lens: Secondary | ICD-10-CM | POA: Diagnosis not present

## 2015-11-23 ENCOUNTER — Encounter: Payer: Self-pay | Admitting: Family Medicine

## 2015-11-23 ENCOUNTER — Ambulatory Visit (INDEPENDENT_AMBULATORY_CARE_PROVIDER_SITE_OTHER): Payer: Medicare Other | Admitting: Family Medicine

## 2015-11-23 VITALS — BP 144/62 | HR 83 | Temp 98.1°F | Resp 18 | Wt 171.8 lb

## 2015-11-23 DIAGNOSIS — R2681 Unsteadiness on feet: Secondary | ICD-10-CM

## 2015-11-23 DIAGNOSIS — R413 Other amnesia: Secondary | ICD-10-CM

## 2015-11-23 DIAGNOSIS — F4323 Adjustment disorder with mixed anxiety and depressed mood: Secondary | ICD-10-CM | POA: Diagnosis not present

## 2015-11-23 NOTE — Progress Notes (Signed)
   Subjective:    Patient ID: Christine Washington, female    DOB: 1936-05-13, 79 y.o.   MRN: [id number]  HPI  Christine Washington is here today to follow-up for  Her depresson and anxiety as well as concerns about memory changes. When I last saw her she scored 70 out of 20 for her many mental status exam. She passed a saw er age bt definitely had some concerns about her depression and anxiety not being maximally treated. We had tried putting her on Cymbalta but was not covered by her insurance so he decided on citaopram. He is now been on the medication for about 5 months and is doing well. Her last crying spell was about 10 days ago.  She is happy with the current regime. Says she better than sh ehas in about 4-6 months. Says her memory problems have been better  She did go see the therapist twice but didn' feel comfortable with her so canceled her next appointment.   she also had some concerns about balance and gait instability. I recommend some information on tai chi and DVDs that she could follow to help with this. She is Re: On a bisphosphonate for her osteoporosis.  She plans on seeing a new Podiatry.  She is gettig some raw skin between her toes.    Review of Systems     Objective:   Physical Exam  Constitutional: She is oriented to person, place, and time. She appears well-developed and well-nourished.  HENT:  Head: Normocephalic and atraumatic.  Cardiovascular: Normal rate, regular rhythm and normal heart sounds.   Pulmonary/Chest: Effort normal and breath sounds normal.  Neurological: She is alert and oriented to person, place, and time.  Skin: Skin is warm and dry.  Psychiatric: She has a normal mood and affect. Her behavior is normal.          Assessment & Plan:   depression/anxiety-she's really noticed a big difference in her mood the last couple of weeks. In fact it's the best she has felt in several months. She denies any negative side effects of the medication.we'll continue citalopram  since she's doing very well on it and see her back in about 2 months. Right now she wants to hold off on doing any therapy/counselng. I encouraged er to call at ay pointin time if she would like a ew referral.  Memory loss - seems to have improved with improving mood.   Gait instability - Encouraged her to try doing the [place]. Hasn't tried it yet but did order a video to follow.

## 2015-11-27 ENCOUNTER — Other Ambulatory Visit: Payer: Self-pay | Admitting: Family Medicine

## 2015-12-01 ENCOUNTER — Other Ambulatory Visit: Payer: Self-pay | Admitting: Family Medicine

## 2015-12-03 DIAGNOSIS — M7742 Metatarsalgia, left foot: Secondary | ICD-10-CM | POA: Diagnosis not present

## 2015-12-03 DIAGNOSIS — M7741 Metatarsalgia, right foot: Secondary | ICD-10-CM | POA: Diagnosis not present

## 2015-12-03 DIAGNOSIS — L859 Epidermal thickening, unspecified: Secondary | ICD-10-CM | POA: Diagnosis not present

## 2015-12-03 DIAGNOSIS — E119 Type 2 diabetes mellitus without complications: Secondary | ICD-10-CM | POA: Diagnosis not present

## 2015-12-03 DIAGNOSIS — L602 Onychogryphosis: Secondary | ICD-10-CM | POA: Diagnosis not present

## 2015-12-04 NOTE — Telephone Encounter (Signed)
Routing to PCP

## 2015-12-10 ENCOUNTER — Encounter: Payer: Self-pay | Admitting: Gastroenterology

## 2015-12-10 ENCOUNTER — Ambulatory Visit (INDEPENDENT_AMBULATORY_CARE_PROVIDER_SITE_OTHER): Payer: Medicare Other | Admitting: Gastroenterology

## 2015-12-10 ENCOUNTER — Other Ambulatory Visit (INDEPENDENT_AMBULATORY_CARE_PROVIDER_SITE_OTHER): Payer: Medicare Other

## 2015-12-10 VITALS — BP 128/70 | HR 76 | Ht 64.5 in | Wt 168.0 lb

## 2015-12-10 DIAGNOSIS — D3A010 Benign carcinoid tumor of the duodenum: Secondary | ICD-10-CM

## 2015-12-10 DIAGNOSIS — F32A Depression, unspecified: Secondary | ICD-10-CM

## 2015-12-10 DIAGNOSIS — R630 Anorexia: Secondary | ICD-10-CM

## 2015-12-10 DIAGNOSIS — R634 Abnormal weight loss: Secondary | ICD-10-CM

## 2015-12-10 DIAGNOSIS — C170 Malignant neoplasm of duodenum: Secondary | ICD-10-CM | POA: Diagnosis not present

## 2015-12-10 DIAGNOSIS — F329 Major depressive disorder, single episode, unspecified: Secondary | ICD-10-CM | POA: Diagnosis not present

## 2015-12-10 LAB — COMPREHENSIVE METABOLIC PANEL
ALK PHOS: 63 U/L (ref 39–117)
ALT: 8 U/L (ref 0–35)
AST: 17 U/L (ref 0–37)
Albumin: 4.5 g/dL (ref 3.5–5.2)
BILIRUBIN TOTAL: 0.6 mg/dL (ref 0.2–1.2)
BUN: 21 mg/dL (ref 6–23)
CO2: 30 meq/L (ref 19–32)
CREATININE: 0.98 mg/dL (ref 0.40–1.20)
Calcium: 10.4 mg/dL (ref 8.4–10.5)
Chloride: 103 mEq/L (ref 96–112)
GFR: 58.43 mL/min — AB (ref >60.00)
GLUCOSE: 96 mg/dL (ref 70–99)
Potassium: 3.8 mEq/L (ref 3.5–5.1)
Sodium: 141 mEq/L (ref 135–145)
TOTAL PROTEIN: 7.5 g/dL (ref 6.0–8.3)

## 2015-12-10 LAB — CBC WITH DIFFERENTIAL/PLATELET
BASOS ABS: 0 10*3/uL (ref 0.0–0.1)
Basophils Relative: 0.4 % (ref 0.0–3.0)
EOS ABS: 0.1 10*3/uL (ref 0.0–0.7)
Eosinophils Relative: 1.3 % (ref 0.0–5.0)
HCT: 41.6 % (ref 36.0–46.0)
Hemoglobin: 13.6 g/dL (ref 12.0–15.0)
LYMPHS ABS: 1.7 10*3/uL (ref 0.7–4.0)
LYMPHS PCT: 18.9 % (ref 12.0–46.0)
MCHC: 32.8 g/dL (ref 30.0–36.0)
MCV: 83.2 fl (ref 78.0–100.0)
MONOS PCT: 7.2 % (ref 3.0–12.0)
Monocytes Absolute: 0.6 10*3/uL (ref 0.1–1.0)
NEUTROS PCT: 72.2 % (ref 43.0–77.0)
Neutro Abs: 6.3 10*3/uL (ref 1.4–7.7)
PLATELETS: 236 10*3/uL (ref 150.0–400.0)
RBC: 4.99 Mil/uL (ref 3.87–5.11)
RDW: 14.3 % (ref 11.5–15.5)
WBC: 8.8 10*3/uL (ref 4.0–10.5)

## 2015-12-10 MED ORDER — ONDANSETRON 4 MG PO TBDP: 4.0000 mg | ORAL_TABLET | Freq: Three times a day (TID) | ORAL | Status: DC | PRN

## 2015-12-10 NOTE — Progress Notes (Signed)
HPI :  79 y/o female here for follow up, former patient of Dr. Olevia Perches. She had been seen for epigastric discomfort which had been longstanding and led to a workup. She had a CT abdomen in 04/2015 which was unremarkable. She has tested negative for celiace disease. She had been on PPI historically. EGD done since our last visit showed a small roughly 49mm duodenal bulb carcinoid, pathology is reporting the "margins are positive". Otherwise no other significant pathology of note. She had a CT abdomen / pelvis with contrast to ensure normal and it did not show any mass lesion of the duodenum or pancreas. She then underwent an EUS and repeat EGD. The mucosa was normal in the duodenal bulb and the EUS showed no residual lesions. Chromogranin was elevated to 90s on initial evaluation. The patient takes prilosec which could be causing this and has not had it repeated yet.   She otherwise denies any significant abdominal pain today, but she has otherwise lost weight over the past month. She has a poor appetite and states she simply does not want to eat. She endorses depression and severe anxiety, which is not well controlled at this time and think her poor appetite is related to depression. She is on celexa for this issue and seeing a counselor for this issue, which she thinks helps a little, but at night she feels worse. No SI. She is eating no set meals, just small snacks at times. She feels nauseated at times. She denies NSAIDs. She denies any blood in the stools or bowel habit changes. Her last colonoscopy was in 2003 which showed diverticulosis but no polyps.     Past Medical History  Diagnosis Date  . Hypertension   . Hyperlipidemia   . OA (osteoarthritis) of knee     left- Dr Latanya Maudlin  . Depression   . GERD (gastroesophageal reflux disease)     Dr Olevia Perches  . Diverticulosis   . Anxiety   . Cervical disc disease   . Diabetic neuropathy ([place])   . Type 2 diabetes mellitus ([place])   . H/O hiatal hernia    . Carpal tunnel syndrome on left   . OSA on CPAP   . History of wrist fracture     LEFT --  09-02-2013/   RESIDUAL DISCOMFORT  . Wears glasses   . History of uterine fibroid   . Benign carcinoid tumor of duodenum      Past Surgical History  Procedure Laterality Date  . Nasal sinus surgery  1990  . Breast reduction surgery  1995  . Total knee arthroplasty Bilateral RIGHT  10-19-2004/   LEFT  10-31-2007  . Knee arthroscopy w/ meniscectomy Left 01-03-2006  . Cardiovascular stress test  12-21-2007    NORMAL NUCLEAR STUDY/  NO ISCHEMIA/  EF 76%  . Transthoracic echocardiogram  12-21-2007    MILD TO MODERATE LAE/  EF 55%/  MILD AV CALCIFICATION WITH NO STENOSIS  . Abdominal hysterectomy  1970's    AND APPENDECTOMY  . Exploratory laparotomy/  lysis adhesions/  bilateral salpingoophorectomy  1980's  . Tonsillectomy  AGE 77  . Carpal tunnel release Left 04/03/2014    Procedure: LEFT CARPAL TUNNEL RELEASE;  Surgeon: Linna Hoff, MD;  Location: [facility];  Service: Orthopedics;  Laterality: Left;  ANESTHESIA: LOCAL WITH IV SEDATION  . Eus N/A 11/05/2015    Procedure: UPPER ENDOSCOPIC ULTRASOUND (EUS) LINEAR;  Surgeon: Milus Banister, MD;  Location: [facility];  Service: Endoscopy;  Laterality: N/A;   Family History  Problem Relation Age of Onset  . Hypertension Mother   . Stroke Mother   . Diabetes Mother   . Stroke Father 41  . Hypertension Father   . Cancer Father     melanoma  . Multiple sclerosis Sister     died  . COPD Sister   . Colon cancer Neg Hx   . Renal cancer Sister    Social History  Substance Use Topics  . Smoking status: Never Smoker   . Smokeless tobacco: Never Used  . Alcohol Use: No   Current Outpatient Prescriptions  Medication Sig Dispense Refill  . alendronate (FOSAMAX) 70 MG tablet TAKE 1 TABLET BY MOUTH EVERY 7 DAYS WITH A FULL GLASS OF WATER ON AN EMPTY STOMACH 12 tablet 3  . ALPRAZolam (XANAX) 0.25 MG tablet TAKE 1 TABLET BY  MOUTH DAILY AS NEEDED ANXIETY 60 tablet 0  . amLODipine (NORVASC) 5 MG tablet TAKE 1 TABLET (5 MG TOTAL) BY MOUTH DAILY. 90 tablet 1  . aspirin 81 MG tablet Take 81 mg by mouth daily.      . Cholecalciferol (VITAMIN D3) 1000 UNITS CAPS Take 2 tablets by mouth daily.     . citalopram (CELEXA) 20 MG tablet Take 1 tablet (20 mg total) by mouth daily. 30 tablet 3  . glucose blood test strip Use as instructed to check blood sugar daily. DX: E11.9 100 each 12  . losartan-hydrochlorothiazide (HYZAAR) 100-12.5 MG tablet TAKE 1 TABLET BY MOUTH DAILY. 90 tablet 0  . Magnesium 250 MG TABS Take 1 tablet by mouth daily.     . metFORMIN (GLUCOPHAGE) 1000 MG tablet TAKE 1 TABLET (1,000 MG TOTAL) BY MOUTH DAILY WITH BREAKFAST. 90 tablet 2  . omeprazole (PRILOSEC) 40 MG capsule TAKE ONE CAPSULE BY MOUTH EVERY DAY 30 capsule 11  . pravastatin (PRAVACHOL) 80 MG tablet TAKE 1 TABLET BY MOUTH EVERY EVENING 90 tablet 2  . traZODone (DESYREL) 100 MG tablet TAKE 1-3 TABLETS (100-300 MG TOTAL) BY MOUTH AT BEDTIME. (Patient taking differently: TAKE 2 TABLETS BY MOUTH AT BEDTIME.) 60 tablet 5  . ondansetron (ZOFRAN ODT) 4 MG disintegrating tablet Take 1 tablet (4 mg total) by mouth every 8 (eight) hours as needed for nausea or vomiting. 20 tablet 0   No current facility-administered medications for this visit.   Allergies  Allergen Reactions  . Ace Inhibitors Cough    Lotensin REACTION: cough  . Ambien [Zolpidem Tartrate] Other (See Comments)    unknown  . Bupropion Hcl Other (See Comments)    Wellbutrin REACTION: hallucinations  . Lipitor [Atorvastatin] Other (See Comments)    memory  . Pioglitazone Other (See Comments)    Actos REACTION: headache     Review of Systems: All systems reviewed and negative except where noted in HPI.   Lab Results  Component Value Date   WBC 8.8 12/10/2015   HGB 13.6 12/10/2015   HCT 41.6 12/10/2015   MCV 83.2 12/10/2015   PLT 236.0 12/10/2015    Lab Results  Component  Value Date   CREATININE 0.98 12/10/2015   BUN 21 12/10/2015   NA 141 12/10/2015   K 3.8 12/10/2015   CL 103 12/10/2015   CO2 30 12/10/2015    Lab Results  Component Value Date   ALT 8 12/10/2015   AST 17 12/10/2015   ALKPHOS 63 12/10/2015   BILITOT 0.6 12/10/2015     Physical Exam: BP 128/70 mmHg  Pulse 76  Ht 5' 4.5" (1.638 m)  Wt 168 lb (76.204 kg)  BMI 28.40 kg/m2  SpO2 96% Constitutional: Pleasant,well-developed, female in no acute distress. HEENT: Normocephalic and atraumatic. Conjunctivae are normal. No scleral icterus. Neck supple.  Cardiovascular: Normal rate, regular rhythm.  Pulmonary/chest: Effort normal and breath sounds normal. No wheezing, rales or rhonchi. Abdominal: Soft, nondistended, nontender. Bowel sounds active throughout. There are no masses palpable.  Extremities: no edema Lymphadenopathy: No cervical adenopathy noted. Neurological: Alert and oriented to person place and time. Skin: Skin is warm and dry. No rashes noted. Psychiatric: Normal mood and affect. Behavior is normal.   ASSESSMENT AND PLAN: 79 y/o female with history of depression and anxiety, seen initially for abdominal pains which appear improved at this time, now with weight loss / poor appetite in the setting of worsening depression. Initial workup as outlined below.   Duodenal carcinoid - 26mm in size, removed with cold forceps. I think this was an incidental finding and not causing her symptoms. She had an elevated chromogranin in the setting of PPI use, suspect a false positive, but will ask her to hold PPI for a few weeks and repeat this to ensure it normalizes. 5HIAA not sent as this is not usually helpful in foregut carcinoids but can consider it if chromogranin remains elevated. Otherwise, CT abdomen did not show any evidence of other mass lesions or metastatic foci. Repeat EGD did not show any overt residual carcinoid and EUS did not show any evidence of it either. We will await  repeat chromogranin level but again suspect this is falsely elevated in the setting of PPI use. Otherwise recommend a repeat EGD in 1 year for this issue to ensure normal.   Weight loss / poor appetite - EGD without a clear etiology. She has had 2 CT scans in the past year without pathology to account for her abdominal symptoms. I obtained baseline labs today and no anemia, labs otherwise normal. I do think her poor appetite and weight loss are a result of ongoing depression and that she is not eating due to this issue. She is on celexa which has helped somewhat, but recommend she see her PCM again or behavioral health given this is not well controlled. She reports she wishes to see another psych provider and will do this. Otherwise I provided her some zofran to take PRN for nausea and see if this helps. She is due for a screening colonoscopy, and while I don't think she has colon cancer based on recent CTs, I offered her a colonoscopy for screening purposes. She declined colonoscopy at this time and does not wish to have any further colonoscopy at her age. If her symptoms do not improve once her depression is improved and appetite improved,  I asked her to contact me. Her friend was present for today's visit and agreed she needed help for her depression.   [place] Cellar, MD [facility] Pager [phone]  CC: Hali Marry, *

## 2015-12-10 NOTE — Patient Instructions (Signed)
Your physician has requested that you go to the basement for lab work before leaving today.  We have sent the following medications to your pharmacy for you to pick up at your convenience: Zofran

## 2015-12-11 ENCOUNTER — Other Ambulatory Visit: Payer: Self-pay | Admitting: *Deleted

## 2015-12-11 DIAGNOSIS — R634 Abnormal weight loss: Secondary | ICD-10-CM

## 2015-12-15 DIAGNOSIS — E782 Mixed hyperlipidemia: Secondary | ICD-10-CM | POA: Diagnosis not present

## 2015-12-15 DIAGNOSIS — F419 Anxiety disorder, unspecified: Secondary | ICD-10-CM | POA: Diagnosis not present

## 2015-12-15 DIAGNOSIS — F3341 Major depressive disorder, recurrent, in partial remission: Secondary | ICD-10-CM | POA: Diagnosis not present

## 2015-12-15 DIAGNOSIS — G4700 Insomnia, unspecified: Secondary | ICD-10-CM | POA: Diagnosis not present

## 2015-12-15 DIAGNOSIS — E1142 Type 2 diabetes mellitus with diabetic polyneuropathy: Secondary | ICD-10-CM | POA: Diagnosis not present

## 2015-12-15 LAB — CHROMOGRANIN A: Chromogranin A: 115 ng/mL — ABNORMAL HIGH (ref <=15)

## 2015-12-22 ENCOUNTER — Other Ambulatory Visit: Payer: Self-pay

## 2015-12-22 MED ORDER — CITALOPRAM HYDROBROMIDE 20 MG PO TABS: 20.0000 mg | ORAL_TABLET | Freq: Every day | ORAL | Status: DC

## 2015-12-30 ENCOUNTER — Telehealth: Payer: Self-pay | Admitting: Gastroenterology

## 2015-12-30 NOTE — Telephone Encounter (Signed)
Spoke with patient and gave her results again. She did not remember that we called with results She states she is not taking PPI and will come for repeat labs.

## 2016-01-01 ENCOUNTER — Other Ambulatory Visit: Payer: Medicare Other

## 2016-01-01 DIAGNOSIS — R634 Abnormal weight loss: Secondary | ICD-10-CM

## 2016-01-06 LAB — CHROMOGRANIN A: CHROMOGRANIN A: 21 ng/mL — AB (ref <=15)

## 2016-01-25 ENCOUNTER — Ambulatory Visit: Payer: Medicare Other | Admitting: Family Medicine

## 2016-02-03 ENCOUNTER — Other Ambulatory Visit: Payer: Self-pay | Admitting: Family Medicine

## 2016-03-14 ENCOUNTER — Encounter: Payer: Self-pay | Admitting: Podiatry

## 2016-03-14 ENCOUNTER — Ambulatory Visit (INDEPENDENT_AMBULATORY_CARE_PROVIDER_SITE_OTHER): Payer: Medicare Other | Admitting: Podiatry

## 2016-03-14 ENCOUNTER — Ambulatory Visit: Payer: Self-pay

## 2016-03-14 VITALS — BP 136/78 | HR 82 | Resp 16

## 2016-03-14 DIAGNOSIS — E1140 Type 2 diabetes mellitus with diabetic neuropathy, unspecified: Secondary | ICD-10-CM

## 2016-03-14 DIAGNOSIS — M79673 Pain in unspecified foot: Secondary | ICD-10-CM

## 2016-03-14 DIAGNOSIS — B078 Other viral warts: Secondary | ICD-10-CM

## 2016-03-14 DIAGNOSIS — B079 Viral wart, unspecified: Secondary | ICD-10-CM

## 2016-03-14 DIAGNOSIS — B351 Tinea unguium: Secondary | ICD-10-CM

## 2016-03-14 DIAGNOSIS — Q828 Other specified congenital malformations of skin: Secondary | ICD-10-CM

## 2016-03-14 NOTE — Progress Notes (Signed)
   Subjective:    Patient ID: Christine Washington, female    DOB: [birth year], 80 y.o.   MRN: [id number]  HPI Pt presents with painful callus right plantar forefoot, left hallux x 6 weeks   Review of Systems  All other systems reviewed and are negative.      Objective:   Physical Exam        Assessment & Plan:

## 2016-03-14 NOTE — Patient Instructions (Signed)

## 2016-03-15 ENCOUNTER — Telehealth: Payer: Self-pay | Admitting: *Deleted

## 2016-03-15 NOTE — Telephone Encounter (Addendum)
Christine Arab, RN states left plantar 1st toe skin wedge to be biopsied suspect plantar wart.  04/15/2016-Dr. Regal reviewed 03/14/2016 left hallux biopsy as a irritated callous.  Informed pt, she states she is doing fine.

## 2016-03-16 NOTE — Progress Notes (Signed)
Subjective:     Patient ID: Christine Washington, female   DOB: 12-15-1936, 80 y.o.   MRN: [id number]  HPI patient presents with a mass on the plantar aspect of the left big toe which has been sore and making it hard for her to walk. She's tried to trim it and pad it without relief of symptoms and it's been present for at least 6 weeks   Review of Systems  All other systems reviewed and are negative.      Objective:   Physical Exam  Constitutional: She is oriented to person, place, and time.  Cardiovascular: Intact distal pulses.   Musculoskeletal: Normal range of motion.  Neurological: She is oriented to person, place, and time.  Skin: Skin is warm.  Nursing note and vitals reviewed.  neurovascular status found to be intact muscle strength adequate range of motion within normal limits with patient found to have good digital perfusion and well oriented 3. Plantar aspect left big toe there is a mass that is oval in appearance and measures approximately 1.2 cm x 8 mm and upon debridement shows pinpoint bleeding and is painful to lateral pressure     Assessment:     Probable large verruca plantaris left    Plan:     H&P and condition reviewed. I've recommended removal of mass and explained procedure and risk and we will get pathology. I infiltrated the left hallux 60 mg I can Marcaine mixture did sterile prep to the area and then utilizing sharp dissection remove the mass in toto and applied a small amount of phenol to the base. I placed the mass in formalin and sent for pathological evaluation

## 2016-04-15 ENCOUNTER — Ambulatory Visit: Payer: Medicare Other | Admitting: Family Medicine

## 2016-05-10 ENCOUNTER — Encounter: Payer: Self-pay | Admitting: Podiatry

## 2016-06-09 ENCOUNTER — Encounter: Payer: Self-pay | Admitting: Podiatry

## 2016-06-09 ENCOUNTER — Ambulatory Visit (INDEPENDENT_AMBULATORY_CARE_PROVIDER_SITE_OTHER): Payer: Medicare Other | Admitting: Podiatry

## 2016-06-09 DIAGNOSIS — B078 Other viral warts: Secondary | ICD-10-CM

## 2016-06-09 DIAGNOSIS — B079 Viral wart, unspecified: Secondary | ICD-10-CM

## 2016-06-09 DIAGNOSIS — L600 Ingrowing nail: Secondary | ICD-10-CM | POA: Diagnosis not present

## 2016-06-10 NOTE — Progress Notes (Signed)
Subjective:     Patient ID: Christine Washington, female   DOB: 02/14/1936, 80 y.o.   MRN: [id number]  HPI patient states that she still having pain under her left big toe and that also she's developed an ingrown toenail on the inside of the left big toe that's bothersome   Review of Systems     Objective:   Physical Exam Neurovascular status intact muscle strength adequate with incurvated left hallux lateral border that's painful and lesion underneath the left hallux which upon debridement shows pinpoint bleeding and pain to lateral pressure    Assessment:     Ingrown toenail deformity left hallux lateral border and verruca plantaris left    Plan:     H&P x-rays reviewed and recommended correction of the ingrown toenail and lesion. Patient wants procedure and I infiltrated with 60 mg Xylocaine Marcaine mixture remove the lateral border the left hallux exposed matrix and applied phenol 3 applications 30 seconds followed by alcohol lavage and sterile dressing. I then debrided the plantar lesion and applied a immune agent to create a response of the body and explain what to do if it should blister and then applied sterile dressing

## 2016-07-20 ENCOUNTER — Other Ambulatory Visit: Payer: Self-pay | Admitting: Family Medicine

## 2016-07-20 DIAGNOSIS — Z1239 Encounter for other screening for malignant neoplasm of breast: Secondary | ICD-10-CM

## 2016-07-20 DIAGNOSIS — M8580 Other specified disorders of bone density and structure, unspecified site: Secondary | ICD-10-CM

## 2016-08-23 ENCOUNTER — Ambulatory Visit (INDEPENDENT_AMBULATORY_CARE_PROVIDER_SITE_OTHER): Payer: Medicare Other

## 2016-08-23 DIAGNOSIS — Z1231 Encounter for screening mammogram for malignant neoplasm of breast: Secondary | ICD-10-CM | POA: Diagnosis not present

## 2016-08-23 DIAGNOSIS — Z1239 Encounter for other screening for malignant neoplasm of breast: Secondary | ICD-10-CM

## 2016-08-26 ENCOUNTER — Ambulatory Visit (INDEPENDENT_AMBULATORY_CARE_PROVIDER_SITE_OTHER): Payer: Medicare Other | Admitting: Podiatry

## 2016-08-26 ENCOUNTER — Encounter: Payer: Self-pay | Admitting: Podiatry

## 2016-08-26 VITALS — BP 130/73 | HR 81 | Resp 14

## 2016-08-26 DIAGNOSIS — L02612 Cutaneous abscess of left foot: Secondary | ICD-10-CM | POA: Diagnosis not present

## 2016-08-26 DIAGNOSIS — L03032 Cellulitis of left toe: Secondary | ICD-10-CM

## 2016-08-26 MED ORDER — CEPHALEXIN 500 MG PO CAPS
500.0000 mg | ORAL_CAPSULE | Freq: Two times a day (BID) | ORAL | 1 refills | Status: DC
Start: 2016-08-26 — End: 2019-05-24

## 2016-08-28 NOTE — Progress Notes (Signed)
Subjective:     Patient ID: Christine Washington, female   DOB: [birth year], 80 y.o.   MRN: [id number]  HPI patient presents with breakdown of tissue on the distal left nail bed that has redness on the lateral corner and moderate discomfort with no proximal edema erythema drainage noted   Review of Systems     Objective:   Physical Exam Patient states she's had slight changes in the left hallux just wanted to have it checked and states it's mildly painful    Assessment:     Appears to be localized paronychia infection    Plan:     Infiltrated left hallux 60 g I can Marcaine mixture remove the corner removed all proud flesh abscess tissue and allowed drainage to occur and instructed on soaks and reappoint. Gave strict instructions of any increased redness or any proximal edema erythema drainage were to occur that we need to see her right away

## 2016-09-26 ENCOUNTER — Encounter: Payer: Self-pay | Admitting: Gastroenterology

## 2016-10-02 ENCOUNTER — Other Ambulatory Visit: Payer: Self-pay | Admitting: Family Medicine

## 2016-12-06 IMAGING — CT CT ABD-PELV W/ CM
2 of 5 series · 16 of 46 positions shown, 18 images · IV contrast (APPLIED)
Comparison: None.

CLINICAL DATA: 78-year-old with left lower quadrant pain.
Tenderness for 8 months.

EXAM:
CT ABDOMEN AND PELVIS WITH CONTRAST
TECHNIQUE: Multidetector CT imaging of the abdomen and pelvis was performed
using the standard protocol following bolus administration of
intravenous contrast.
CONTRAST:  100mL OMNIPAQUE IOHEXOL 300 MG/ML  SOLN

[Series 2: abd/pelvis 5.0 b31f · axial · 0.85mm/px · z∈[+934,+1354]mm · 13 of 96 slices shown, 15 images]
[im 6/96  soft-tissue]
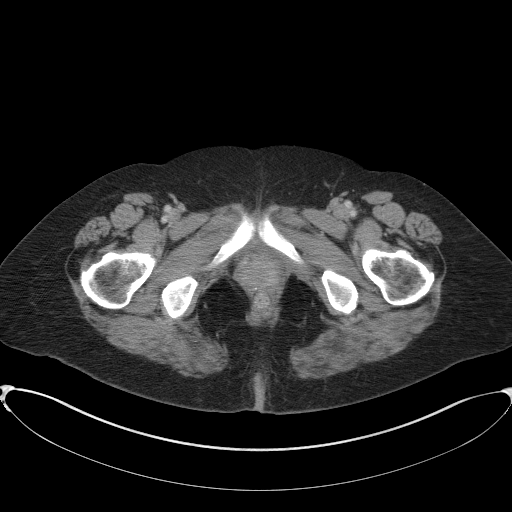
[im 6/96  bone]
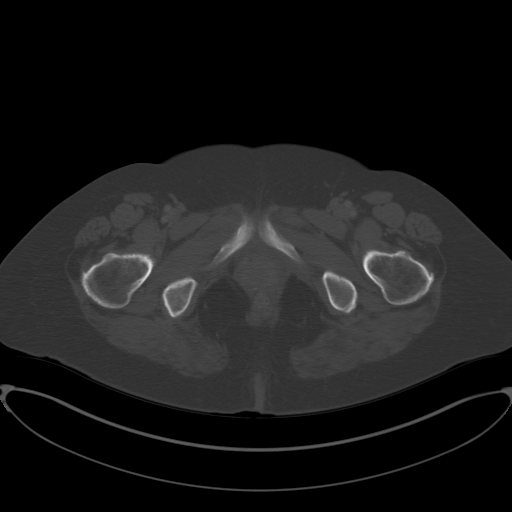
[im 11/96  soft-tissue]
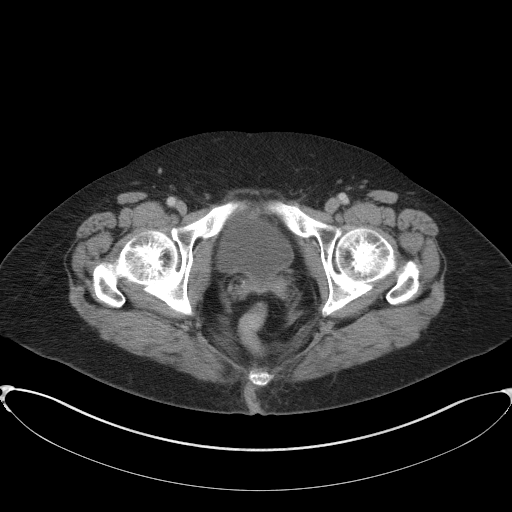
[im 22/96  soft-tissue]
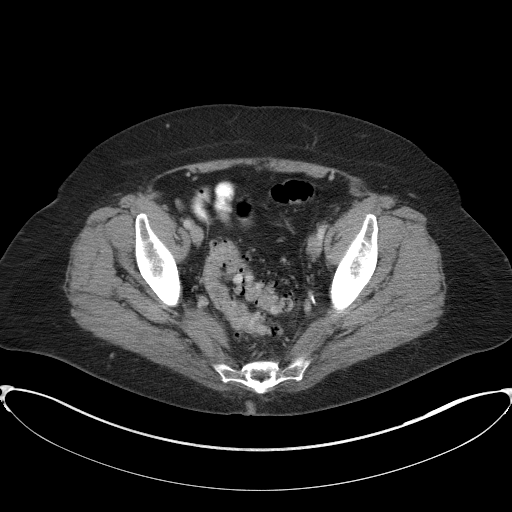
[im 27/96  soft-tissue]
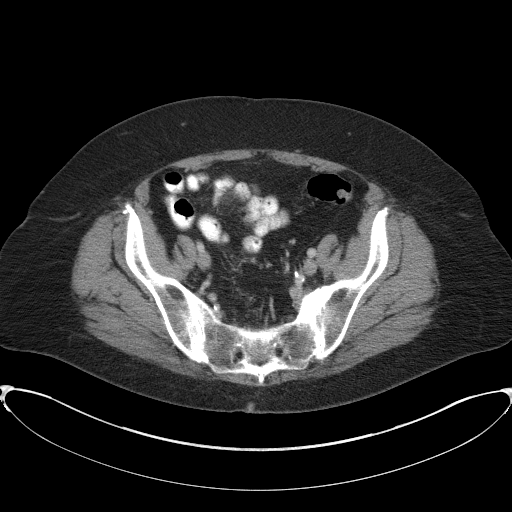
[im 32/96  soft-tissue]
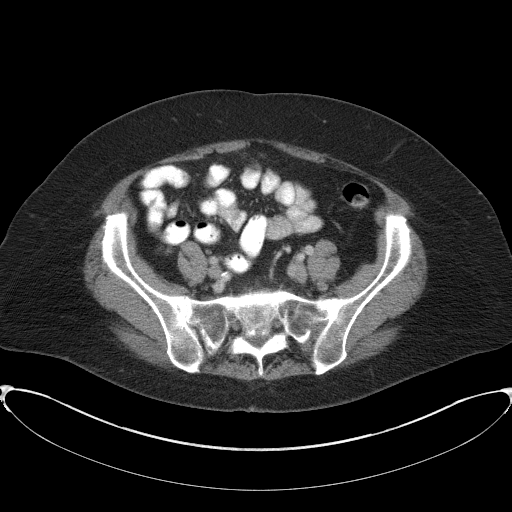
[im 43/96  soft-tissue]
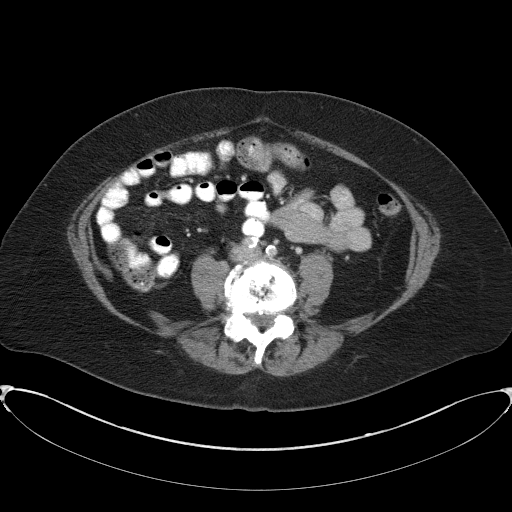
[im 48/96  soft-tissue]
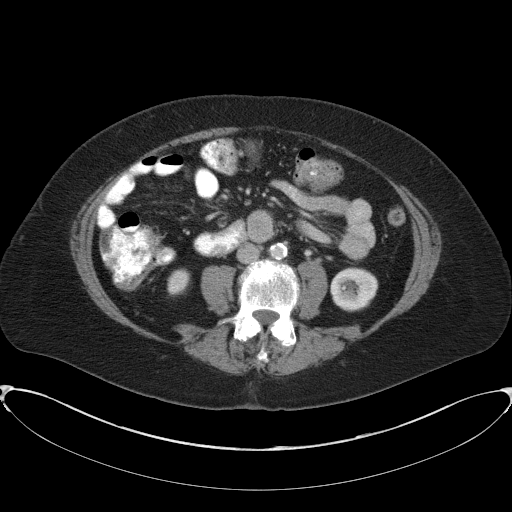
[im 53/96  soft-tissue]
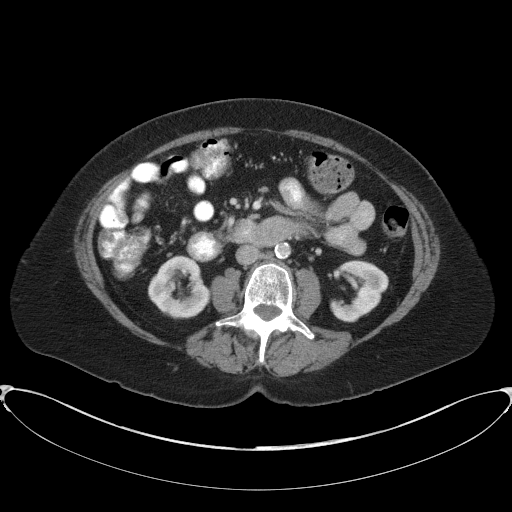
[im 64/96  soft-tissue]
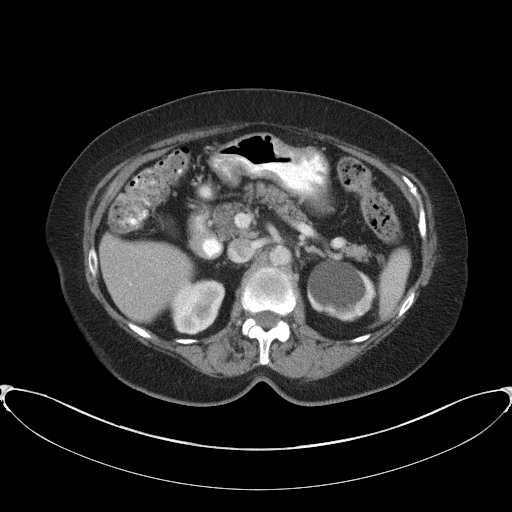
[im 64/96  bone]
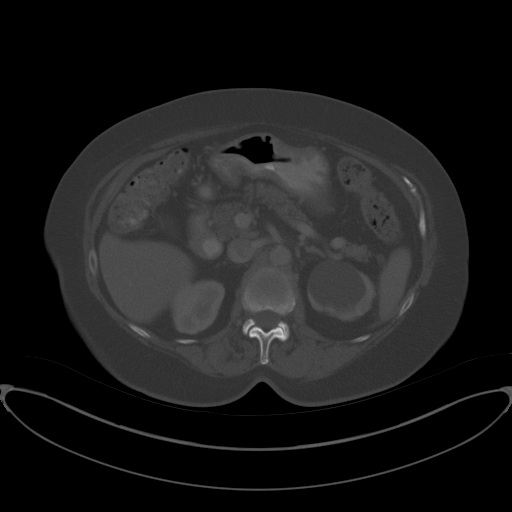
[im 69/96  soft-tissue]
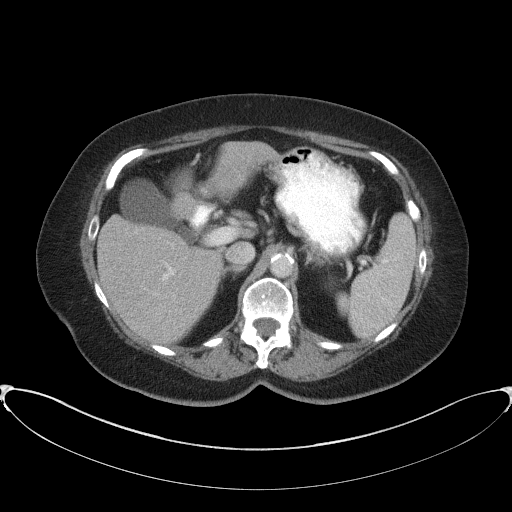
[im 74/96  soft-tissue]
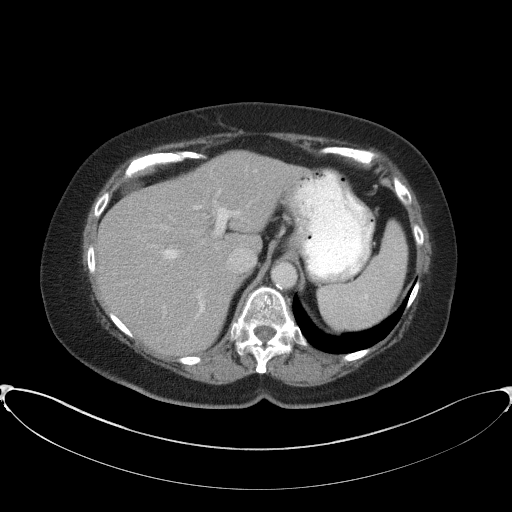
[im 85/96  soft-tissue]
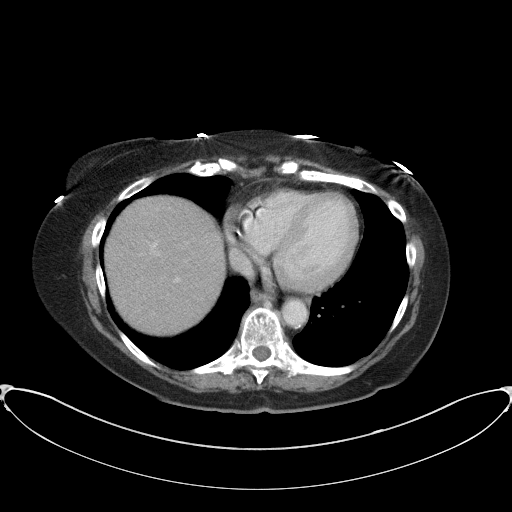
[im 90/96  soft-tissue]
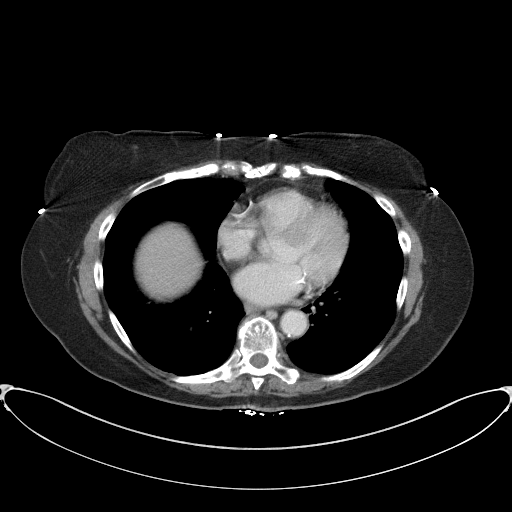

[Series 5: abd/pelvis 3.0 coronal · coronal · 0.79mm/px · 3 of 93 slices shown]
[im 31/93  soft-tissue]
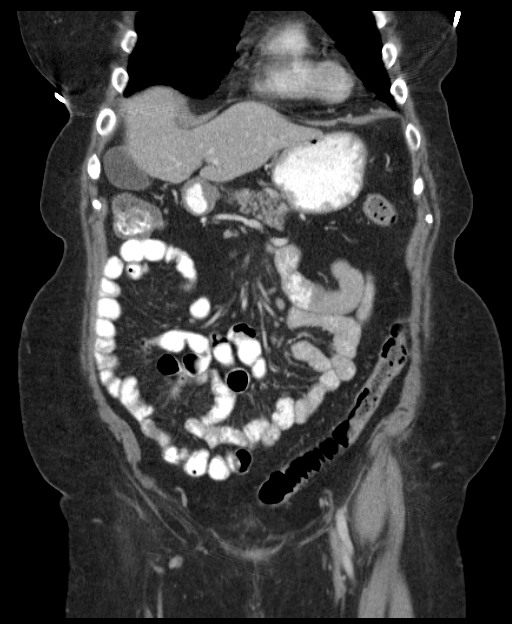
[im 41/93  soft-tissue]
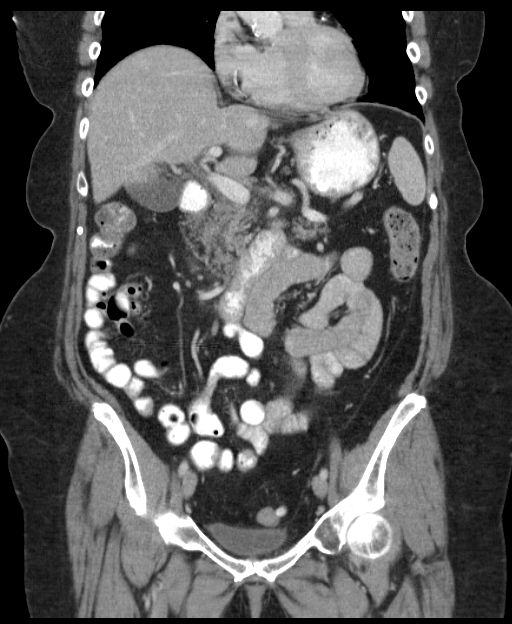
[im 52/93  soft-tissue]
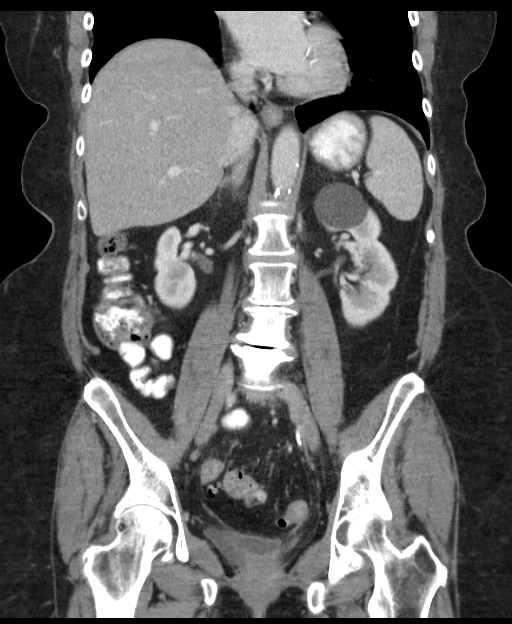

[16 of 46 positions shown; findings below may reference images not displayed]

FINDINGS: Lung bases are clear.  Negative for free intraperitoneal air.

Evidence for coronary artery calcifications. No significant pleural
effusions. Normal appearance of the liver, gallbladder and the
portal venous system is patent. Normal appearance of the spleen,
pancreas and adrenal glands. Round low-density structure along the
left kidney upper pole is water density and measures 4.8 cm.
Findings are compatible with a left renal cortical cyst. One or two
additional left renal cysts. Negative for hydronephrosis. Normal
appearance of the right kidney.

Extensive diverticulosis involving the sigmoid colon. No evidence
for acute colonic inflammation. No acute abnormalities involving the
small bowel. No evidence for bowel obstruction. Atherosclerotic
calcifications involving the abdominal aorta without aneurysm. No
significant free fluid or lymphadenopathy. Uterus is surgically
absent. Ovaries appears to be absent.

There is significant disc space narrowing at L3-L4, L4-L5 and L5-S1
with vacuum disc phenomena. Facet arthropathy in the lower lumbar
spine. Vertebral body heights are maintained.
IMPRESSION: No acute abnormality in the abdomen or pelvis.

Diverticulosis, particularly in the sigmoid colon. No evidence for
acute inflammation.

Left renal cysts.

## 2017-04-17 ENCOUNTER — Ambulatory Visit: Payer: Medicare Other | Admitting: Podiatry

## 2017-06-22 ENCOUNTER — Other Ambulatory Visit: Payer: Self-pay | Admitting: Internal Medicine

## 2017-06-22 DIAGNOSIS — R1310 Dysphagia, unspecified: Secondary | ICD-10-CM

## 2017-07-05 ENCOUNTER — Ambulatory Visit
Admission: RE | Admit: 2017-07-05 | Discharge: 2017-07-05 | Disposition: A | Discharge status: Home / Self Care | Payer: Medicare Other | Source: Ambulatory Visit | Attending: Internal Medicine | Admitting: Internal Medicine

## 2017-07-05 DIAGNOSIS — R1310 Dysphagia, unspecified: Secondary | ICD-10-CM

## 2017-10-11 ENCOUNTER — Other Ambulatory Visit: Payer: Self-pay | Admitting: Internal Medicine

## 2017-10-11 DIAGNOSIS — Z1231 Encounter for screening mammogram for malignant neoplasm of breast: Secondary | ICD-10-CM

## 2017-10-20 ENCOUNTER — Ambulatory Visit (INDEPENDENT_AMBULATORY_CARE_PROVIDER_SITE_OTHER): Payer: Medicare Other

## 2017-10-20 DIAGNOSIS — Z1231 Encounter for screening mammogram for malignant neoplasm of breast: Secondary | ICD-10-CM

## 2017-10-20 LAB — HM MAMMOGRAPHY

## 2017-10-25 ENCOUNTER — Other Ambulatory Visit: Payer: Self-pay | Admitting: Family Medicine

## 2017-10-25 ENCOUNTER — Ambulatory Visit (INDEPENDENT_AMBULATORY_CARE_PROVIDER_SITE_OTHER): Payer: Medicare Other

## 2017-10-25 DIAGNOSIS — M85831 Other specified disorders of bone density and structure, right forearm: Secondary | ICD-10-CM

## 2017-10-25 DIAGNOSIS — M8580 Other specified disorders of bone density and structure, unspecified site: Secondary | ICD-10-CM

## 2017-10-25 DIAGNOSIS — M85852 Other specified disorders of bone density and structure, left thigh: Secondary | ICD-10-CM

## 2017-10-25 HISTORY — PX: DG  BONE DENSITY (ARMC HX): HXRAD1102

## 2018-05-15 HISTORY — PX: COLONOSCOPY: SHX174

## 2018-05-15 LAB — HM COLONOSCOPY

## 2018-07-31 ENCOUNTER — Other Ambulatory Visit: Payer: Self-pay | Admitting: Family Medicine

## 2018-07-31 DIAGNOSIS — R413 Other amnesia: Secondary | ICD-10-CM

## 2018-08-16 ENCOUNTER — Ambulatory Visit
Admission: RE | Admit: 2018-08-16 | Discharge: 2018-08-16 | Disposition: A | Discharge status: Home / Self Care | Payer: Medicare Other | Source: Ambulatory Visit | Attending: Family Medicine | Admitting: Family Medicine

## 2018-08-16 DIAGNOSIS — R413 Other amnesia: Secondary | ICD-10-CM

## 2018-09-12 ENCOUNTER — Other Ambulatory Visit: Payer: Self-pay | Admitting: Internal Medicine

## 2018-09-12 DIAGNOSIS — Z1231 Encounter for screening mammogram for malignant neoplasm of breast: Secondary | ICD-10-CM

## 2018-10-24 ENCOUNTER — Ambulatory Visit (INDEPENDENT_AMBULATORY_CARE_PROVIDER_SITE_OTHER): Payer: Medicare Other

## 2018-10-24 DIAGNOSIS — Z1231 Encounter for screening mammogram for malignant neoplasm of breast: Secondary | ICD-10-CM | POA: Diagnosis not present

## 2018-11-01 DIAGNOSIS — F0280 Dementia in other diseases classified elsewhere without behavioral disturbance: Secondary | ICD-10-CM | POA: Insufficient documentation

## 2018-12-26 LAB — CBC AND DIFFERENTIAL
HCT: 42 (ref 36–46)
Hemoglobin: 13.9 (ref 12.0–16.0)
Platelets: 248 (ref 150–399)
WBC: 7.8

## 2018-12-26 LAB — BASIC METABOLIC PANEL
BUN: 25 — AB (ref 4–21)
Creatinine: 1.2 — AB (ref 0.5–1.1)
Glucose: 99
Potassium: 5 (ref 3.4–5.3)
Sodium: 140 (ref 137–147)

## 2018-12-26 LAB — HEPATIC FUNCTION PANEL
ALT: 12 (ref 7–35)
AST: 20 (ref 13–35)
Alkaline Phosphatase: 73 (ref 25–125)

## 2018-12-26 LAB — HEMOGLOBIN A1C: Hemoglobin A1C: 5.7

## 2018-12-27 ENCOUNTER — Other Ambulatory Visit: Payer: Self-pay | Admitting: Physician Assistant

## 2018-12-27 DIAGNOSIS — R413 Other amnesia: Secondary | ICD-10-CM

## 2019-01-11 ENCOUNTER — Ambulatory Visit
Admission: RE | Admit: 2019-01-11 | Discharge: 2019-01-11 | Disposition: A | Discharge status: Home / Self Care | Payer: Medicare Other | Source: Ambulatory Visit | Attending: Physician Assistant | Admitting: Physician Assistant

## 2019-01-11 DIAGNOSIS — R413 Other amnesia: Secondary | ICD-10-CM

## 2019-01-11 MED ORDER — GADOBENATE DIMEGLUMINE 529 MG/ML IV SOLN
7.0000 mL | Freq: Once | INTRAVENOUS | Status: AC | PRN
  Administered 2019-01-11: 7 mL via INTRAVENOUS

## 2019-02-13 LAB — LIPID PANEL
Cholesterol: 144 (ref 0–200)
HDL: 53 (ref 35–70)
LDL Cholesterol: 67
Triglycerides: 119 (ref 40–160)

## 2019-02-13 LAB — BASIC METABOLIC PANEL
BUN: 28 — AB (ref 4–21)
Creatinine: 1.1 (ref 0.5–1.1)
Glucose: 106
Potassium: 4.4 (ref 3.4–5.3)
Sodium: 140 (ref 137–147)

## 2019-02-13 LAB — HEMOGLOBIN A1C: Hemoglobin A1C: 5.8

## 2019-02-13 LAB — HEPATIC FUNCTION PANEL
ALT: 11 (ref 7–35)
AST: 23 (ref 13–35)
Alkaline Phosphatase: 66 (ref 25–125)

## 2019-03-14 ENCOUNTER — Ambulatory Visit: Payer: Medicare Other | Admitting: Family Medicine

## 2019-04-09 ENCOUNTER — Telehealth: Payer: Self-pay

## 2019-04-09 NOTE — Telephone Encounter (Signed)
Patient has appointment next month to establish (from [facility]), possible dementia/alzheimers.  Patient does not have internet or smart phone and is requesting appt to be moved up for anxiety. Advised to call [facility] for appt this week, patient hesitant since she is unhappy with care there. Is this patient candidate for "in clinic" NP visit?  Please advise.

## 2019-04-09 NOTE — Telephone Encounter (Signed)
This encounter was created in error - please disregard.

## 2019-04-09 NOTE — Telephone Encounter (Signed)
I think it would be more prudent to wait for "in office" visit in 2 wks due to age and several medical conditions that place her at higher risk of severe illness if she contracts covid-19. -thx

## 2019-04-10 NOTE — Telephone Encounter (Signed)
Attempted to contact patient regarding upcoming appt. Line busy, will continue to reach out.

## 2019-04-11 NOTE — Telephone Encounter (Signed)
Attempted to contact pt regarding appt. Phone busy.

## 2019-04-26 ENCOUNTER — Ambulatory Visit: Payer: Medicare Other | Admitting: Family Medicine

## 2019-05-24 ENCOUNTER — Encounter: Payer: Self-pay | Admitting: Family Medicine

## 2019-05-24 ENCOUNTER — Telehealth: Payer: Self-pay | Admitting: Family Medicine

## 2019-05-24 ENCOUNTER — Other Ambulatory Visit: Payer: Self-pay

## 2019-05-24 ENCOUNTER — Ambulatory Visit (INDEPENDENT_AMBULATORY_CARE_PROVIDER_SITE_OTHER): Payer: Medicare Other | Admitting: Family Medicine

## 2019-05-24 DIAGNOSIS — I10 Essential (primary) hypertension: Secondary | ICD-10-CM

## 2019-05-24 DIAGNOSIS — F0390 Unspecified dementia without behavioral disturbance: Secondary | ICD-10-CM

## 2019-05-24 DIAGNOSIS — E7800 Pure hypercholesterolemia, unspecified: Secondary | ICD-10-CM

## 2019-05-24 DIAGNOSIS — E119 Type 2 diabetes mellitus without complications: Secondary | ICD-10-CM | POA: Diagnosis not present
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
## 2019-05-24 NOTE — Telephone Encounter (Signed)
Pls continue to try to contact pt's son and make sure he is ok with the plan of f/u in office in 3 weeks, bring medication bottles with him.  He needs to come in with her.--thx

## 2019-05-27 NOTE — Telephone Encounter (Signed)
Pts son Lissa Hoard was called and appt was scheduled for next week per sons request, number changed to sons cell phone as the primary number. He will bring medications and come with mom to appt

## 2019-05-31 ENCOUNTER — Encounter: Payer: Self-pay | Admitting: Family Medicine

## 2019-06-05 ENCOUNTER — Ambulatory Visit: Payer: Medicare Other | Admitting: Family Medicine

## 2019-06-05 ENCOUNTER — Other Ambulatory Visit: Payer: Self-pay

## 2019-06-05 ENCOUNTER — Encounter: Payer: Self-pay | Admitting: Family Medicine

## 2019-06-05 VITALS — BP 133/74 | HR 68 | Temp 98.0°F | Resp 14 | Ht 63.0 in | Wt 147.4 lb

## 2019-06-05 DIAGNOSIS — E1142 Type 2 diabetes mellitus with diabetic polyneuropathy: Secondary | ICD-10-CM

## 2019-06-05 DIAGNOSIS — E1149 Type 2 diabetes mellitus with other diabetic neurological complication: Secondary | ICD-10-CM | POA: Diagnosis not present

## 2019-06-05 DIAGNOSIS — E1169 Type 2 diabetes mellitus with other specified complication: Secondary | ICD-10-CM

## 2019-06-05 DIAGNOSIS — F0390 Unspecified dementia without behavioral disturbance: Secondary | ICD-10-CM | POA: Diagnosis not present

## 2019-06-05 DIAGNOSIS — I10 Essential (primary) hypertension: Secondary | ICD-10-CM

## 2019-06-05 DIAGNOSIS — R2681 Unsteadiness on feet: Secondary | ICD-10-CM

## 2019-06-05 DIAGNOSIS — L84 Corns and callosities: Secondary | ICD-10-CM

## 2019-06-05 LAB — BASIC METABOLIC PANEL
BUN: 22 mg/dL (ref 6–23)
CO2: 30 mEq/L (ref 19–32)
Calcium: 9.6 mg/dL (ref 8.4–10.5)
Chloride: 102 mEq/L (ref 96–112)
Creatinine, Ser: 0.85 mg/dL (ref 0.40–1.20)
GFR: 64.21 mL/min (ref >60.00)
Glucose, Bld: 88 mg/dL (ref 70–99)
Potassium: 3.9 mEq/L (ref 3.5–5.1)
Sodium: 139 mEq/L (ref 135–145)

## 2019-06-05 LAB — MICROALBUMIN / CREATININE URINE RATIO
Creatinine,U: 141.9 mg/dL
Microalb Creat Ratio: 1 mg/g (ref 0.0–30.0)
Microalb, Ur: 1.4 mg/dL (ref 0.0–1.9)

## 2019-06-05 LAB — TSH: TSH: 1.36 u[IU]/mL (ref 0.35–4.50)

## 2019-06-05 NOTE — Progress Notes (Signed)
OFFICE VISIT  06/16/2019   CC:  Chief Complaint  Patient presents with  . Follow-up    labs, exam, plan for recurrent falls   HPI:    Patient is a 83 y.o. Caucasian female who presents for 2 week f/u dementia w/out behavioral disturbance accompanied by her son Lissa Hoard.  He lives in [place] and pt lives in [place]. My last visit with her 2 wks ago was to establish care but she had no one with her to assist with history so I asked her to come in to office for a visit with her son to help.   Of note, CBC, CMET, FLP, and HbA1c all within the last few months were normal.  Most recent TSH was normal in 2016.  Level of functioning: primarily has problems with short term memory, word finding, mild cognitive impairment.  Saw neurologist at some point in recent past but pt/son cannot recall being told any dx, nor any plan for further work up or follow up. She does all ADLs w/out assistance.  She drives a limited range of distance.  Son's biggest worry is her balance. She has fallen a couple times w/out injury.  Has had PT for this in the past.  She did get benefit from this, less falls.  No tremors. She has appt with [facility] neurology for 2nd opinion on her cognitive issues 07/11/19.  She has been on oral diabetic med in the remote past but on diet-only now for quite a while.  She denies any burning, tingling, or numbness in feet. She has had some calluses shaved by a podiatrist in the past but the patient and son's impression is that this was not done well so they ask for referral to new podiatrist.  Past Medical History:  Diagnosis Date  . Anxiety and depression   . Benign carcinoid tumor of duodenum   . Carpal tunnel syndrome on left   . Cervical disc disease   . Chronic renal insufficiency, stage 3 (moderate) (HCC)    GFR 50s-60s  . Dementia without behavioral disturbance ([place])   . Diabetic neuropathy ([place])   . Diverticulosis   . GERD (gastroesophageal reflux disease)    with HH  .  History of uterine fibroid   . History of wrist fracture    LEFT --  09-02-2013/   RESIDUAL DISCOMFORT  . Hyperlipidemia   . Hypertension   . OA (osteoarthritis) of knee    left- Dr Latanya Maudlin  . OSA on CPAP   . Osteoporosis    due for repeat DEXA after 10/2019  . Type 2 diabetes mellitus (HCC)    with DPN    Past Surgical History:  Procedure Laterality Date  . ABDOMINAL HYSTERECTOMY  1970's   AND APPENDECTOMY  . BREAST REDUCTION SURGERY  1995  . CARDIOVASCULAR STRESS TEST  12-21-2007   NORMAL NUCLEAR STUDY/  NO ISCHEMIA/  EF 76%  . CARPAL TUNNEL RELEASE Left 04/03/2014   Procedure: LEFT CARPAL TUNNEL RELEASE;  Surgeon: Linna Hoff, MD;  Location: [facility];  Service: Orthopedics;  Laterality: Left;  ANESTHESIA: LOCAL WITH IV SEDATION  . COLONOSCOPY  05/15/2018   diverticulosis.  No further screening colonoscopies needed.  . DG  BONE DENSITY ([place] [facility])  10/25/2017   Repeat 10/2019  . EUS N/A 11/05/2015   Procedure: UPPER ENDOSCOPIC ULTRASOUND (EUS) LINEAR;  Surgeon: Milus Banister, MD;  Location: [facility];  Service: Endoscopy;  Laterality: N/A;  . EXPLORATORY LAPAROTOMY/  LYSIS ADHESIONS/  BILATERAL SALPINGOOPHORECTOMY  1980's  . KNEE ARTHROSCOPY W/ MENISCECTOMY Left 01-03-2006  . NASAL SINUS SURGERY  1990  . REDUCTION MAMMAPLASTY    . TONSILLECTOMY  AGE 75  . TOTAL KNEE ARTHROPLASTY Bilateral RIGHT  10-19-2004/   LEFT  10-31-2007  . TRANSTHORACIC ECHOCARDIOGRAM  12-21-2007   MILD TO MODERATE LAE/  EF 55%/  MILD AV CALCIFICATION WITH NO STENOSIS   Social History   Socioeconomic History  . Marital status: Divorced    Spouse name: Not on file  . Number of children: 2  . Years of education: Not on file  . Highest education level: Not on file  Occupational History  . Occupation: retired  Scientific laboratory technician  . Financial resource strain: Not on file  . Food insecurity    Worry: Not on file    Inability: Not on file  . Transportation needs    Medical: Not  on file    Non-medical: Not on file  Tobacco Use  . Smoking status: Never Smoker  . Smokeless tobacco: Never Used  Substance and Sexual Activity  . Alcohol use: No  . Drug use: No  . Sexual activity: Not on file  Lifestyle  . Physical activity    Days per week: Not on file    Minutes per session: Not on file  . Stress: Not on file  Relationships  . Social Herbalist on phone: Not on file    Gets together: Not on file    Attends religious service: Not on file    Active member of club or organization: Not on file    Attends meetings of clubs or organizations: Not on file    Relationship status: Not on file  Other Topics Concern  . Not on file  Social History Narrative   Widow, has 2 grown sons (1 in the local [place], and one in [place]).   Lives by herself as of 05/2019.   No T/A/Ds.  No pets.            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 Her sister died in 01-12-2012.    Outpatient Medications Prior to Visit  Medication Sig Dispense Refill  . ALPRAZolam (XANAX) 0.5 MG tablet Take 0.5 mg by mouth daily.    Marland Kitchen amLODipine (NORVASC) 5 MG tablet TAKE 1 TABLET (5 MG TOTAL) BY MOUTH DAILY. 90 tablet 1  . aspirin 81 MG tablet Take 81 mg by mouth daily.      Marland Kitchen losartan-hydrochlorothiazide (HYZAAR) 100-12.5 MG tablet TAKE 1 TABLET BY MOUTH DAILY. 90 tablet 0  . omeprazole (PRILOSEC) 40 MG capsule Take 40 mg by mouth daily.    . pravastatin (PRAVACHOL) 80 MG tablet TAKE 1 TABLET BY MOUTH EVERY EVENING 90 tablet 2  . Venlafaxine HCl 225 MG TB24 Take 225 mg by mouth daily.    Marland Kitchen alendronate (FOSAMAX) 70 MG tablet TAKE 1 TABLET BY MOUTH EVERY 7 DAYS WITH A FULL GLASS OF WATER ON AN EMPTY STOMACH (Patient not taking: Reported on 05/24/2019) 12 tablet 3  . traZODone (DESYREL) 100 MG tablet TAKE 1-3 TABLETS (100-300 MG TOTAL) BY MOUTH AT BEDTIME. (Patient not taking: Reported on 05/24/2019) 60 tablet 5  . Cholecalciferol (VITAMIN D3) 1000 UNITS CAPS  Take 2 tablets by mouth daily.     . Magnesium 250 MG TABS Take 1 tablet by mouth daily.     . ONE TOUCH ULTRA TEST test strip USE AS INSTRUCTED TO  CHECK BLOOD SUGAR DAILY. DX: E11.9 (Patient not taking: Reported on 05/24/2019) 100 each 1   No facility-administered medications prior to visit.     Allergies  Allergen Reactions  . Lipitor [Atorvastatin] Other (See Comments)    memory  . Pioglitazone Other (See Comments)    HEADACHE Actos REACTION: headache  . Ace Inhibitors Cough    Lotensin REACTION: cough  . Ambien [Zolpidem Tartrate] Other (See Comments)    unknown  . Bupropion Hcl Other (See Comments)    Wellbutrin REACTION: hallucinations    ROS As per HPI  PE: Blood pressure 133/74, pulse 68, temperature 98 F (36.7 C), temperature source Temporal, resp. rate 14, height 5\' 3"  (1.6 m), weight 147 lb 6.4 oz (66.9 kg), SpO2 98 %. Gen: Alert, well appearing.  Patient is oriented to person, place, time, and situation. AFFECT: pleasant, lucid thought and speech. She does repeat questions some, repeats statements some. Attends well.  I did not do a MMSE today. Neuro: CN 2-12 intact bilaterally, strength 5/5 in proximal and distal upper extremities and lower extremities bilaterally. Both feet with some decreased sensation to fine touch. No tremor.  She walks slowly with pretty short steps and feet close together, but not shuffling.  Pushes up with arms x 1 to get out of chair.  Does 2 point turnaround with a noticeable amount of imbalance that she protects herself against..  No cogwheel rigidity.  No mask-like facies. Feet: several large calluses w/out erythema, tenderness, or fluctuance.    LABS:  Lab Results  Component Value Date   TSH 1.36 06/05/2019   Lab Results  Component Value Date   WBC 7.8 12/26/2018   HGB 13.9 12/26/2018   HCT 42 12/26/2018   MCV 83.2 12/10/2015   PLT 248 12/26/2018   Lab Results  Component Value Date   CREATININE 0.85 06/05/2019   BUN 22  06/05/2019   NA 139 06/05/2019   K 3.9 06/05/2019   CL 102 06/05/2019   CO2 30 06/05/2019   Lab Results  Component Value Date   ALT 11 02/13/2019   AST 23 02/13/2019   ALKPHOS 66 02/13/2019   BILITOT 0.6 12/10/2015   Lab Results  Component Value Date   CHOL 144 02/13/2019   Lab Results  Component Value Date   HDL 53 02/13/2019   Lab Results  Component Value Date   LDLCALC 67 02/13/2019   Lab Results  Component Value Date   TRIG 119 02/13/2019   Lab Results  Component Value Date   CHOLHDL 3.3 04/15/2015   Lab Results  Component Value Date   HGBA1C 5.8 02/13/2019    IMPRESSION AND PLAN:  1) MCI with memory impairment-->minimal -to-no functional impairment, just balance/gait instability.  I do think she has some DPN contributing. PT referral for gait instability.  Neuro 2nd opinion already arranged by her son.for Nov this year.  2) DM 2 with DPN and pre-ulcerative calluses. Podiatry referral. Continue diet, no diabetic meds. Urine microalb/cr today. BMET and TSH today.  3) HTN: The current medical regimen is effective;  continue present plan and medications. BMET today.  An After Visit Summary was printed and given to the patient.  FOLLOW UP: Return in about 3 months (around 09/05/2019) for routine chronic illness f/u (30 min).  Signed:  Crissie Sickles, MD           06/16/2019

## 2019-06-07 ENCOUNTER — Encounter: Payer: Self-pay | Admitting: Family Medicine

## 2019-06-11 ENCOUNTER — Ambulatory Visit: Payer: Self-pay | Admitting: *Deleted

## 2019-06-11 ENCOUNTER — Telehealth: Payer: Self-pay | Admitting: Family Medicine

## 2019-06-11 ENCOUNTER — Ambulatory Visit: Payer: Medicare Other | Admitting: Neurology

## 2019-06-11 NOTE — Telephone Encounter (Signed)
Duplicate phone note.

## 2019-06-11 NOTE — Telephone Encounter (Signed)
Covid testing is NOT needed. She should quarantine for 14 days, though.
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
## 2019-06-11 NOTE — Telephone Encounter (Signed)
Pt called stating that she was exposed to a church member that tested positive for COVID; this occurred on 06/09/2019; she denies symptoms; recommendations made per nurse triage; the verbalized understanding; pt transferred to [facility] for scheduling.  Reason for Disposition . [1] COVID-19 EXPOSURE (Close Contact) AND [2] within last 14 days BUT [3] NO symptoms  Answer Assessment - Initial Assessment Questions 1. CLOSE CONTACT: "Who is the person with the confirmed or suspected COVID-19 infection that you were exposed to?"     Church member 2. PLACE of CONTACT: "Where were you when you were exposed to COVID-19?" (e.g., home, school, medical waiting room; which city?)     church 3. TYPE of CONTACT: "How much contact was there?" (e.g., sitting next to, live in same house, work in same office, same building)     Sitting next to person at church (indoors) 4. DURATION of CONTACT: "How long were you in contact with the COVID-19 patient?" (e.g., a few seconds, passed by person, a few minutes, live with the patient)     2 hours at church 5. DATE of CONTACT: "When did you have contact with a COVID-19 patient?" (e.g., how many days ago)    06/09/2019 6. TRAVEL: "Have you traveled out of the country recently?" If so, "When and where?"     * Also ask about out-of-state travel, since the CDC has identified some high-risk cities for community spread in the [place].     * Note: Travel becomes less relevant if there is widespread community transmission where the patient lives.     no 7. COMMUNITY SPREAD: "Are there lots of cases of COVID-19 (community spread) where you live?" (See public health department website, if unsure)       Major spread [place] 8. SYMPTOMS: "Do you have any symptoms?" (e.g., fever, cough, breathing difficulty)     no 9. PREGNANCY OR POSTPARTUM: "Is there any chance you are pregnant?" "When was your last menstrual period?" "Did you deliver in the last 2 weeks?"      no 10. HIGH RISK: "Do you have any heart or lung problems? Do you have a weak immune system?" (e.g., CHF, COPD, asthma, HIV positive, chemotherapy, renal failure, diabetes mellitus, sickle cell anemia)       diabetic  Protocols used: CORONAVIRUS (COVID-19) EXPOSURE-A-AH

## 2019-06-11 NOTE — Telephone Encounter (Signed)
Please advise, thanks.

## 2019-06-11 NOTE — Telephone Encounter (Signed)
Patient was advised to self-quarantine.

## 2019-06-12 NOTE — Telephone Encounter (Signed)
SW pt's son, Normajean Baxter regarding shot and advised was not needed and pt was advised earlier today and yesterday afternoon to self quarantine.

## 2019-06-12 NOTE — Telephone Encounter (Signed)
Patient called asking when someone was coming to give her a shot. Patient requested that a CMA to call her back.

## 2019-06-12 NOTE — Telephone Encounter (Signed)
SW pt and she is unsure what shot it was. Will call back once son is home since he has more information than she could remember.

## 2019-06-12 NOTE — Telephone Encounter (Signed)
Patient's son Lissa Hoard called to confirm about the shot his mother is asking about. Please call him at [phone].

## 2019-06-13 ENCOUNTER — Ambulatory Visit: Payer: Medicare Other | Admitting: Podiatry

## 2019-06-16 ENCOUNTER — Encounter: Payer: Self-pay | Admitting: Family Medicine

## 2019-06-18 ENCOUNTER — Encounter: Payer: Self-pay | Admitting: Family Medicine

## 2019-06-25 DIAGNOSIS — R262 Difficulty in walking, not elsewhere classified: Secondary | ICD-10-CM | POA: Diagnosis not present

## 2019-06-26 DIAGNOSIS — R262 Difficulty in walking, not elsewhere classified: Secondary | ICD-10-CM | POA: Diagnosis not present

## 2019-06-27 ENCOUNTER — Telehealth: Payer: Self-pay

## 2019-06-27 NOTE — Telephone Encounter (Signed)
Received PT evaluation form needing PCP signature. Given to provider to review and sign.

## 2019-07-03 DIAGNOSIS — R262 Difficulty in walking, not elsewhere classified: Secondary | ICD-10-CM | POA: Diagnosis not present

## 2019-07-05 ENCOUNTER — Other Ambulatory Visit: Payer: Self-pay

## 2019-07-05 ENCOUNTER — Encounter: Payer: Self-pay | Admitting: Podiatry

## 2019-07-05 ENCOUNTER — Ambulatory Visit (INDEPENDENT_AMBULATORY_CARE_PROVIDER_SITE_OTHER): Payer: Medicare Other | Admitting: Podiatry

## 2019-07-05 ENCOUNTER — Ambulatory Visit (INDEPENDENT_AMBULATORY_CARE_PROVIDER_SITE_OTHER): Payer: Medicare Other

## 2019-07-05 VITALS — BP 131/72 | Temp 98.0°F

## 2019-07-05 DIAGNOSIS — M79671 Pain in right foot: Secondary | ICD-10-CM

## 2019-07-05 DIAGNOSIS — M79672 Pain in left foot: Secondary | ICD-10-CM | POA: Diagnosis not present

## 2019-07-05 DIAGNOSIS — G629 Polyneuropathy, unspecified: Secondary | ICD-10-CM

## 2019-07-05 DIAGNOSIS — E1142 Type 2 diabetes mellitus with diabetic polyneuropathy: Secondary | ICD-10-CM

## 2019-07-05 DIAGNOSIS — M7750 Other enthesopathy of unspecified foot: Secondary | ICD-10-CM

## 2019-07-05 DIAGNOSIS — L84 Corns and callosities: Secondary | ICD-10-CM

## 2019-07-09 DIAGNOSIS — R262 Difficulty in walking, not elsewhere classified: Secondary | ICD-10-CM | POA: Diagnosis not present

## 2019-07-10 ENCOUNTER — Encounter: Payer: Self-pay | Admitting: *Deleted

## 2019-07-11 ENCOUNTER — Ambulatory Visit: Payer: Medicare Other | Admitting: Neurology

## 2019-07-11 ENCOUNTER — Encounter: Payer: Self-pay | Admitting: Neurology

## 2019-07-11 ENCOUNTER — Telehealth: Payer: Self-pay | Admitting: *Deleted

## 2019-07-11 NOTE — Telephone Encounter (Signed)
No showed new patient appointment. 

## 2019-07-15 DIAGNOSIS — R262 Difficulty in walking, not elsewhere classified: Secondary | ICD-10-CM | POA: Diagnosis not present

## 2019-07-18 DIAGNOSIS — R262 Difficulty in walking, not elsewhere classified: Secondary | ICD-10-CM | POA: Diagnosis not present

## 2019-07-25 ENCOUNTER — Other Ambulatory Visit: Payer: Self-pay | Admitting: Podiatry

## 2019-07-25 DIAGNOSIS — G629 Polyneuropathy, unspecified: Secondary | ICD-10-CM

## 2019-08-07 ENCOUNTER — Telehealth: Payer: Self-pay | Admitting: Family Medicine

## 2019-08-07 NOTE — Telephone Encounter (Signed)
Patient's son called back and took the first available appointment on 08/23/19. Patient's son asked if Dr. Anitra Lauth had any recommendations for patient in the meantime.

## 2019-08-07 NOTE — Telephone Encounter (Signed)
Patient's son left VM that patient has been complaining of fatigue lately. He is asking for an appointment 08/09/19 to run bloodwork on patient. No appointments available. Called son back and left a voicemail that we would check with her provider and call him back. Please advise.

## 2019-08-08 NOTE — Telephone Encounter (Signed)
Pt's son Lissa Hoard would like to know if any recommendations he can do about pt feeling fatigued until appt on 9/4.  Please advise, thanks.

## 2019-08-08 NOTE — Telephone Encounter (Signed)
Sorry, unable to give any specific recommendations w/out being able to actually evaluate her first, but I can recommend having her check bp and glucose at home periodically to see if either of these is significantly abnormal. Also, how long has she felt fatigued (few days or more like a few weeks or more?).  Any urinary complaints or constipation complaints?

## 2019-08-08 NOTE — Telephone Encounter (Signed)
Pt's son, Lissa Hoard stated she has been c/o fatigue for 1.5 weeks. It is possible she may be constipated and has not taken anything. He was advised to have pt check bp and glucose.

## 2019-08-12 ENCOUNTER — Telehealth: Payer: Self-pay | Admitting: Family Medicine

## 2019-08-12 MED ORDER — GLUCOSE BLOOD VI STRP: ORAL_STRIP | 1 refills | Status: DC

## 2019-08-12 NOTE — Telephone Encounter (Signed)
Pt's son called in to request a Rx for One Touch Ultra 2 Test. Pt will be running out soon  Pharmacy: [id number] - [place], [place] [phone] (Phone) [phone] (Fax)

## 2019-08-12 NOTE — Telephone Encounter (Signed)
Rx sent to pharmacy.  OV notes dated 05/24/2019 said for patient to check BS once daily.

## 2019-08-23 ENCOUNTER — Other Ambulatory Visit: Payer: Self-pay

## 2019-08-23 ENCOUNTER — Ambulatory Visit: Payer: Medicare Other | Admitting: Family Medicine

## 2019-08-23 ENCOUNTER — Encounter: Payer: Self-pay | Admitting: Family Medicine

## 2019-08-23 VITALS — BP 123/67 | HR 79 | Temp 98.4°F | Resp 16 | Ht 63.0 in | Wt 145.0 lb

## 2019-08-23 DIAGNOSIS — R5382 Chronic fatigue, unspecified: Secondary | ICD-10-CM | POA: Diagnosis not present

## 2019-08-23 DIAGNOSIS — R413 Other amnesia: Secondary | ICD-10-CM | POA: Diagnosis not present

## 2019-08-23 DIAGNOSIS — R531 Weakness: Secondary | ICD-10-CM

## 2019-08-23 DIAGNOSIS — E1140 Type 2 diabetes mellitus with diabetic neuropathy, unspecified: Secondary | ICD-10-CM

## 2019-08-23 NOTE — Addendum Note (Signed)
Addended by: Marlene Bast A on: 08/23/2019 02:00 PM   Modules accepted: Orders

## 2019-08-23 NOTE — Progress Notes (Signed)
OFFICE VISIT  08/23/2019   CC:  Chief Complaint  Patient presents with  . Fatigue    x 2-3 weeks ago   HPI:    Patient is a 83 y.o. Caucasian female who presents accompanied by her son for fatigue. She established care with me a few months ago. She has some MCI and hx of falls.  Neuro eval was done but pt/son not clear on what happened/dx? So son arranged neuro 2nd opinion with [facility] but she no showed for that appt 07/11/19.  Has appt again with [facility] next week.  Short term memory getting worse.  Word finding problems getting worse.  Still independent in ADLs.  Still driving.  No HAs.   She was getting PT but they stopped this b/c she mentioned not feeling well so they thought she may have covid so they were afraid.  Has been staying home a lot b/c of covid restrictions and this has adversely affected her overall outlook on things.  No distinct feelings of being depressed. She is frustrated b/c not being able to do things she used to do. Feeling fatigued/very tired lately, diffuse generalized weakness.  She eats junk b/c she doesn't cook anymore.  No n/v/d.  BMs hurt, occur daily.  Are black, unsure how long.  Not tar consistency. No BRBPR.  No pepto.  No consistent use of any laxative.  No urinary urgency, incontinence, or dysuria.  No hematuria.  No fevers.  No nasal congestion, cough, no SOB.  Long term->ST in mornings. She does not take any NSAIDs.  Denies abd pain.  Reviewed glucoses and bp's from 8/25-9/3 today: bp 135-140/80, P90s. Gluc 103 up to 183.  No hx of MI/CAD/CHF/CVA/PAD. Has OSA->CPAP->not using it for years.  Feeling good today!   Past Medical History:  Diagnosis Date  . Anxiety and depression   . Benign carcinoid tumor of duodenum   . Carpal tunnel syndrome on left   . Cervical disc disease   . Chronic renal insufficiency, stage 3 (moderate) (HCC)    GFR 50s  . Dementia without behavioral disturbance ([place])   . Diabetic neuropathy ([place])   . Diverticulosis   .  GERD (gastroesophageal reflux disease)    with HH  . History of uterine fibroid   . History of wrist fracture    LEFT --  09-02-2013/   RESIDUAL DISCOMFORT  . Hyperlipidemia   . Hypertension   . Memory changes   . OA (osteoarthritis) of knee    left- Dr Latanya Maudlin  . OSA on CPAP   . Osteoporosis    due for repeat DEXA after 10/2019  . Type 2 diabetes mellitus (HCC)    with DPN    Past Surgical History:  Procedure Laterality Date  . ABDOMINAL HYSTERECTOMY  1970's   AND APPENDECTOMY  . BREAST REDUCTION SURGERY  1995  . CARDIOVASCULAR STRESS TEST  12-21-2007   NORMAL NUCLEAR STUDY/  NO ISCHEMIA/  EF 76%  . CARPAL TUNNEL RELEASE Left 04/03/2014   Procedure: LEFT CARPAL TUNNEL RELEASE;  Surgeon: Linna Hoff, MD;  Location: [facility];  Service: Orthopedics;  Laterality: Left;  ANESTHESIA: LOCAL WITH IV SEDATION  . COLONOSCOPY  05/15/2018   diverticulosis.  No further screening colonoscopies needed.  . DG  BONE DENSITY ([place] HX)  10/25/2017   Repeat 10/2019  . EUS N/A 11/05/2015   Procedure: UPPER ENDOSCOPIC ULTRASOUND (EUS) LINEAR;  Surgeon: Milus Banister, MD;  Location: [facility];  Service: Endoscopy;  Laterality:  N/A;  . EXPLORATORY LAPAROTOMY/  LYSIS ADHESIONS/  BILATERAL SALPINGOOPHORECTOMY  1980's  . KNEE ARTHROSCOPY W/ MENISCECTOMY Left 01-03-2006  . NASAL SINUS SURGERY  1990  . REDUCTION MAMMAPLASTY    . TONSILLECTOMY  AGE 71  . TOTAL KNEE ARTHROPLASTY Bilateral RIGHT  10-19-2004/   LEFT  10-31-2007  . TRANSTHORACIC ECHOCARDIOGRAM  12-21-2007   MILD TO MODERATE LAE/  EF 55%/  MILD AV CALCIFICATION WITH NO STENOSIS    Outpatient Medications Prior to Visit  Medication Sig Dispense Refill  . alendronate (FOSAMAX) 70 MG tablet TAKE 1 TABLET BY MOUTH EVERY 7 DAYS WITH A FULL GLASS OF WATER ON AN EMPTY STOMACH 12 tablet 3  . ALPRAZolam (XANAX) 0.5 MG tablet Take 0.5 mg by mouth daily.    Marland Kitchen amLODipine (NORVASC) 5 MG tablet TAKE 1 TABLET (5 MG TOTAL) BY  MOUTH DAILY. 90 tablet 1  . aspirin 81 MG tablet Take 81 mg by mouth daily.      Marland Kitchen glucose blood test strip Check Blood sugar once daily.  One touch Ultra E11.9 100 each 1  . losartan-hydrochlorothiazide (HYZAAR) 100-12.5 MG tablet TAKE 1 TABLET BY MOUTH DAILY. 90 tablet 0  . omeprazole (PRILOSEC) 40 MG capsule Take 40 mg by mouth daily.    . pravastatin (PRAVACHOL) 80 MG tablet TAKE 1 TABLET BY MOUTH EVERY EVENING 90 tablet 2  . traZODone (DESYREL) 100 MG tablet TAKE 1-3 TABLETS (100-300 MG TOTAL) BY MOUTH AT BEDTIME. 60 tablet 5  . Venlafaxine HCl 225 MG TB24 Take 225 mg by mouth daily.     No facility-administered medications prior to visit.     Allergies  Allergen Reactions  . Lipitor [Atorvastatin] Other (See Comments)    memory  . Pioglitazone Other (See Comments)    HEADACHE Actos REACTION: headache  . Ace Inhibitors Cough    Lotensin REACTION: cough  . Ambien [Zolpidem Tartrate] Other (See Comments)    unknown  . Bupropion Hcl Other (See Comments)    Wellbutrin REACTION: hallucinations    ROS As per HPI  PE: Blood pressure 123/67, pulse 79, temperature 98.4 F (36.9 C), temperature source Temporal, resp. rate 16, height '5\' 3"'  (1.6 m), weight 145 lb (65.8 kg), SpO2 98 %. Gen: Alert, well appearing.  Patient is oriented to person, place, time, and situation.  In good humor.  AFFECT: pleasant, lucid thought and speech. KCL:EXNT: no injection, icteris, swelling, or exudate.  EOMI, PERRLA. Mouth: lips without lesion/swelling.  Oral mucosa pink and moist. Oropharynx without erythema, exudate, or swelling.  Neck - No masses or thyromegaly or limitation in range of motion CV: RRR, no m/r/g.   LUNGS: CTA bilat, nonlabored resps, good aeration in all lung fields. ABD: soft, ND, mild diffuse TTP w/out guarding or rebound.  No bruit, mass, or HSM. EXT: no clubbing or cyanosis.  no edema.  No joint swelling, erythema, or tenderness.   SKIN: no rash, pallor, or  jaundice.   LABS:  Lab Results  Component Value Date   TSH 1.36 06/05/2019   Lab Results  Component Value Date   WBC 7.8 12/26/2018   HGB 13.9 12/26/2018   HCT 42 12/26/2018   MCV 83.2 12/10/2015   PLT 248 12/26/2018   Lab Results  Component Value Date   FERRITIN 45 05/09/2012   Lab Results  Component Value Date   ZGYFVCBS49 675 05/09/2012    Lab Results  Component Value Date   CREATININE 0.85 06/05/2019   BUN 22 06/05/2019  NA 139 06/05/2019   K 3.9 06/05/2019   CL 102 06/05/2019   CO2 30 06/05/2019   Lab Results  Component Value Date   ALT 11 02/13/2019   AST 23 02/13/2019   ALKPHOS 66 02/13/2019   BILITOT 0.6 12/10/2015   Lab Results  Component Value Date   CHOL 144 02/13/2019   Lab Results  Component Value Date   HDL 53 02/13/2019   Lab Results  Component Value Date   LDLCALC 67 02/13/2019   Lab Results  Component Value Date   TRIG 119 02/13/2019   Lab Results  Component Value Date   CHOLHDL 3.3 04/15/2015   Lab Results  Component Value Date   HGBA1C 5.8 02/13/2019   IMPRESSION AND PLAN:  Fatigue, acute on chronic generalized weakness-> in the setting of gradually progressive short term memory problems.  Fortunately she has not fallen in several months.  She is anxious to return to PT. Regarding her fatigue, I think she is suffering from "dwindles" from lack of stimulation and relative isolation caused by the covid 19 crisis affecting our world the last 7-8 months.   She looks good today and feels good.  Glucoses and BP's have been good.   The only piece of info that has me concerned is her report of black stool, but she doesn't exactly describe melena.  I'm checking CMET, CBC, iron labs, vit b12, and ESR.  Will also check home hemoccults x 3.  She will see neurologist with [facility] next week for further evaluation of her cognitive impairment.  No new meds or changes to her treatment regimen were made today.  An After Visit Summary was  printed and given to the patient.  FOLLOW UP: Return for keep appt scheduled for 09/04/19.  Signed:  Crissie Sickles, MD           08/23/2019

## 2019-08-24 LAB — CBC WITH DIFFERENTIAL/PLATELET
Absolute Monocytes: 1027 cells/uL — ABNORMAL HIGH (ref 200–950)
Basophils Absolute: 54 cells/uL (ref 0–200)
Basophils Relative: 0.5 %
Eosinophils Absolute: 150 cells/uL (ref 15–500)
Eosinophils Relative: 1.4 %
HCT: 42.6 % (ref 35.0–45.0)
Hemoglobin: 14.2 g/dL (ref 11.7–15.5)
Lymphs Abs: 1605 cells/uL (ref 850–3900)
MCH: 28.8 pg (ref 27.0–33.0)
MCHC: 33.3 g/dL (ref 32.0–36.0)
MCV: 86.4 fL (ref 80.0–100.0)
MPV: 11.1 fL (ref 7.5–12.5)
Monocytes Relative: 9.6 %
Neutro Abs: 7865 cells/uL — ABNORMAL HIGH (ref 1500–7800)
Neutrophils Relative %: 73.5 %
Platelets: 237 10*3/uL (ref 140–400)
RBC: 4.93 10*6/uL (ref 3.80–5.10)
RDW: 12.8 % (ref 11.0–15.0)
Total Lymphocyte: 15 %
WBC: 10.7 10*3/uL (ref 3.8–10.8)

## 2019-08-24 LAB — COMPREHENSIVE METABOLIC PANEL
AG Ratio: 2 (calc) (ref 1.0–2.5)
ALT: 9 U/L (ref 6–29)
AST: 21 U/L (ref 10–35)
Albumin: 4.6 g/dL (ref 3.6–5.1)
Alkaline phosphatase (APISO): 68 U/L (ref 37–153)
BUN/Creatinine Ratio: 20 (calc) (ref 6–22)
BUN: 20 mg/dL (ref 7–25)
CO2: 27 mmol/L (ref 20–32)
Calcium: 10.4 mg/dL (ref 8.6–10.4)
Chloride: 102 mmol/L (ref 98–110)
Creat: 1.01 mg/dL — ABNORMAL HIGH (ref 0.60–0.88)
Globulin: 2.3 g/dL (calc) (ref 1.9–3.7)
Glucose, Bld: 95 mg/dL (ref 65–99)
Potassium: 4.4 mmol/L (ref 3.5–5.3)
Sodium: 138 mmol/L (ref 135–146)
Total Bilirubin: 1 mg/dL (ref 0.2–1.2)
Total Protein: 6.9 g/dL (ref 6.1–8.1)

## 2019-08-24 LAB — SEDIMENTATION RATE: Sed Rate: 9 mm/h (ref 0–30)

## 2019-08-24 LAB — HEMOGLOBIN A1C
Hgb A1c MFr Bld: 5.5 % of total Hgb (ref <5.7)
Mean Plasma Glucose: 111 (calc)
eAG (mmol/L): 6.2 (calc)

## 2019-08-24 LAB — IRON,TIBC AND FERRITIN PANEL
%SAT: 16 % (calc) (ref 16–45)
Ferritin: 92 ng/mL (ref 16–288)
Iron: 53 ug/dL (ref 45–160)
TIBC: 339 mcg/dL (calc) (ref 250–450)

## 2019-08-24 LAB — VITAMIN B12: Vitamin B-12: 452 pg/mL (ref 200–1100)

## 2019-08-27 ENCOUNTER — Other Ambulatory Visit: Payer: Self-pay

## 2019-08-27 ENCOUNTER — Encounter: Payer: Self-pay | Admitting: Neurology

## 2019-08-27 ENCOUNTER — Ambulatory Visit (INDEPENDENT_AMBULATORY_CARE_PROVIDER_SITE_OTHER): Payer: Medicare Other | Admitting: Neurology

## 2019-08-27 VITALS — BP 120/60 | HR 68 | Temp 98.4°F | Ht 63.0 in | Wt 146.0 lb

## 2019-08-27 DIAGNOSIS — G3281 Cerebellar ataxia in diseases classified elsewhere: Secondary | ICD-10-CM

## 2019-08-27 DIAGNOSIS — R413 Other amnesia: Secondary | ICD-10-CM | POA: Diagnosis not present

## 2019-08-27 DIAGNOSIS — R269 Unspecified abnormalities of gait and mobility: Secondary | ICD-10-CM

## 2019-08-27 MED ORDER — DONEPEZIL HCL 10 MG PO TABS: 10.0000 mg | ORAL_TABLET | Freq: Every day | ORAL | 11 refills | Status: DC

## 2019-08-27 MED ORDER — MEMANTINE HCL 10 MG PO TABS: 10.0000 mg | ORAL_TABLET | Freq: Two times a day (BID) | ORAL | 11 refills | Status: DC

## 2019-08-27 NOTE — Progress Notes (Signed)
PATIENT: Christine Washington DOB: 04-Aug-1936  Chief Complaint  Patient presents with   Memory Loss    MMSE 24/30 - 7 animals.  She is here with her son, Deanda Ruddell, to have her memory loss further evaluated.    PCP    McGowen, Adrian Blackwater, MD     HISTORICAL  Christine Washington is a 83 year old female, seen in request by her primary care physician Dr. Anitra Lauth, Arnette Norris for evaluation of memory loss, she is accompanied by her son Ilana Prezioso at today's clinic visit on August 27, 2019.  I have reviewed and summarized the referring note from the referring physician.  She has past medical history of hypertension, hyperlipidemia, depression,  She lives by herself in the home but she has grew up, retired from data entry job, has 2 sons, both are involved in her care, she was noted to have gradual onset memory loss since 2019, gradually getting worse, mainly involving her short-term memory, forget people's names, she still able to keep her checkbook imbalance, still drives short distance,  She was noted to have gradual onset gait abnormality over the past few years, she complains of achiness all over,  I personally reviewed MRI of brain in January 2020, no acute abnormality, generalized atrophy, mild to moderate supratentorium small vessel disease. Laboratory evaluation September 2020, normal ESR, B12 452, iron panel, CMP showed mild elevated creatinine 1.01, CBC was normal,  REVIEW OF SYSTEMS: Full 14 system review of systems performed and notable only for as above All other review of systems were negative.  ALLERGIES: Allergies  Allergen Reactions   Lipitor [Atorvastatin] Other (See Comments)    memory   Pioglitazone Other (See Comments)    HEADACHE Actos REACTION: headache   Ace Inhibitors Cough    Lotensin REACTION: cough   Ambien [Zolpidem Tartrate] Other (See Comments)    unknown   Bupropion Hcl Other (See Comments)    Wellbutrin REACTION: hallucinations    HOME  MEDICATIONS: Current Outpatient Medications  Medication Sig Dispense Refill   ALPRAZolam (XANAX) 0.5 MG tablet Take 0.5 mg by mouth daily.     amLODipine (NORVASC) 5 MG tablet TAKE 1 TABLET (5 MG TOTAL) BY MOUTH DAILY. 90 tablet 1   aspirin 81 MG tablet Take 81 mg by mouth daily.       CALCIUM PO Take 1 tablet by mouth daily.     losartan-hydrochlorothiazide (HYZAAR) 100-12.5 MG tablet TAKE 1 TABLET BY MOUTH DAILY. 90 tablet 0   omeprazole (PRILOSEC) 40 MG capsule Take 40 mg by mouth daily.     pravastatin (PRAVACHOL) 80 MG tablet TAKE 1 TABLET BY MOUTH EVERY EVENING 90 tablet 2   Venlafaxine HCl 225 MG TB24 Take 225 mg by mouth daily.     No current facility-administered medications for this visit.     PAST MEDICAL HISTORY: Past Medical History:  Diagnosis Date   Anxiety and depression    Benign carcinoid tumor of duodenum    Carpal tunnel syndrome on left    Cervical disc disease    Chronic renal insufficiency, stage 3 (moderate) (HCC)    GFR 50s   Dementia without behavioral disturbance (HCC)    Diabetic neuropathy (HCC)    Diverticulosis    GERD (gastroesophageal reflux disease)    with HH   History of uterine fibroid    History of wrist fracture    LEFT --  09-02-2013/   RESIDUAL DISCOMFORT   Hyperlipidemia    Hypertension  Memory changes    OA (osteoarthritis) of knee    left- Dr Latanya Maudlin   OSA on CPAP    Osteoporosis    due for repeat DEXA after 10/2019   Type 2 diabetes mellitus ([place])    with DPN    PAST SURGICAL HISTORY: Past Surgical History:  Procedure Laterality Date   ABDOMINAL HYSTERECTOMY  1970's   AND APPENDECTOMY   BREAST REDUCTION SURGERY  1995   CARDIOVASCULAR STRESS TEST  12-21-2007   NORMAL NUCLEAR STUDY/  NO ISCHEMIA/  EF 76%   CARPAL TUNNEL RELEASE Left 04/03/2014   Procedure: LEFT CARPAL TUNNEL RELEASE;  Surgeon: Linna Hoff, MD;  Location: [facility];  Service: Orthopedics;  Laterality:  Left;  ANESTHESIA: LOCAL WITH IV SEDATION   COLONOSCOPY  05/15/2018   diverticulosis.  No further screening colonoscopies needed.   DG  BONE DENSITY ([place] HX)  10/25/2017   Repeat 10/2019   EUS N/A 11/05/2015   Procedure: UPPER ENDOSCOPIC ULTRASOUND (EUS) LINEAR;  Surgeon: Milus Banister, MD;  Location: [facility];  Service: Endoscopy;  Laterality: N/A;   EXPLORATORY LAPAROTOMY/  LYSIS ADHESIONS/  BILATERAL SALPINGOOPHORECTOMY  1980's   KNEE ARTHROSCOPY W/ MENISCECTOMY Left 01-03-2006   NASAL SINUS SURGERY  1990   REDUCTION MAMMAPLASTY     TONSILLECTOMY  AGE 72   TOTAL KNEE ARTHROPLASTY Bilateral RIGHT  10-19-2004/   LEFT  10-31-2007   TRANSTHORACIC ECHOCARDIOGRAM  12-21-2007   MILD TO MODERATE LAE/  EF 55%/  MILD AV CALCIFICATION WITH NO STENOSIS    FAMILY HISTORY: Family History  Problem Relation Age of Onset   Hypertension Mother    Stroke Mother    Diabetes Mother    Stroke Father 36   Hypertension Father    Cancer Father        melanoma   Multiple sclerosis Sister        died   COPD Sister    Colon cancer Neg Hx     SOCIAL HISTORY: Social History   Socioeconomic History   Marital status: Divorced    Spouse name: Not on file   Number of children: 2   Years of education: 12   Highest education level: High school graduate  Occupational History   Occupation: retired  Scientist, product/process development strain: Not on file   Food insecurity    Worry: Not on file    Inability: Not on Lexicographer needs    Medical: Not on file    Non-medical: Not on file  Tobacco Use   Smoking status: Never Smoker   Smokeless tobacco: Never Used  Substance and Sexual Activity   Alcohol use: No   Drug use: No   Sexual activity: Not on file  Lifestyle   Physical activity    Days per week: Not on file    Minutes per session: Not on file   Stress: Not on file  Relationships   Social connections    Talks on phone: Not on file     Gets together: Not on file    Attends religious service: Not on file    Active member of club or organization: Not on file    Attends meetings of clubs or organizations: Not on file    Relationship status: Not on file   Intimate partner violence    Fear of current or ex partner: Not on file    Emotionally abused: Not on file    Physically abused: Not  on file    Forced sexual activity: Not on file  Other Topics Concern   Not on file  Social History Narrative   Widow, has 2 grown sons (1 in the local [place], and one in [place]).   Lives by herself as of 05/2019.   No T/A/Ds.  No pets.   Right-handed.   One cup caffeine per day.   Lives alone.            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 Her sister died in 2011-12-31.     PHYSICAL EXAM   Vitals:   08/27/19 1520  BP: 120/60  Pulse: 68  Temp: 98.4 F (36.9 C)  Weight: 146 lb (66.2 kg)  Height: '5\' 3"'  (1.6 m)    Not recorded      Body mass index is 25.86 kg/m.  PHYSICAL EXAMNIATION:  Gen: NAD, conversant, well nourised, well groomed                     Cardiovascular: Regular rate rhythm, no peripheral edema, warm, nontender. Eyes: Conjunctivae clear without exudates or hemorrhage Neck: Supple, no carotid bruits. Pulmonary: Clear to auscultation bilaterally   NEUROLOGICAL EXAM:  MMSE - Mini Mental State Exam 08/27/2019  Orientation to time 4  Orientation to Place 5  Registration 3  Attention/ Calculation 3  Recall 0  Language- name 2 objects 2  Language- repeat 1  Language- follow 3 step command 3  Language- read & follow direction 1  Write a sentence 1  Copy design 1  Total score 24  Animal naming 7   CRANIAL NERVES: CN II: Visual fields are full to confrontation.Pupils are round equal and briskly reactive to light. CN III, IV, VI: extraocular movement are normal. No ptosis. CN V: Facial sensation is intact to pinprick in all 3 divisions bilaterally. Corneal responses  are intact.  CN VII: Face is symmetric with normal eye closure and smile. CN VIII: Hearing is normal to rubbing fingers CN IX, X: Palate elevates symmetrically. Phonation is normal. CN XI: Head turning and shoulder shrug are intact CN XII: Tongue is midline with normal movements and no atrophy.  MOTOR: There is no pronator drift of out-stretched arms. Muscle bulk and tone are normal. Muscle strength is normal.  REFLEXES: Reflexes are 2+ and symmetric at the biceps, triceps, knees, and ankles. Plantar responses are flexor.  SENSORY: Mildly length dependent decreased to light touch, and vibratory sensation at toes  COORDINATION: Rapid alternating movements and fine finger movements are intact. There is no dysmetria on finger-to-nose and heel-knee-shin.    GAIT/STANCE: She needs push-up to get up from seated position, wide-based, cautious   DIAGNOSTIC DATA (LABS, IMAGING, TESTING) - I reviewed patient records, labs, notes, testing and imaging myself where available.   ASSESSMENT AND PLAN  MAYVIS AGUDELO is a 83 y.o. female   Short-term memory loss  Generalized atrophy on the brain MRI,  No treatable etiology on laboratory evaluations  Most consistent with central nervous system degenerative disorder,   Proceed with Namenda 10 mg twice a day, Aricept 10 mg daily,  Gait abnormality,  Slowly progressive, was found to have hyperreflexia examination, does complains of achiness all over including neck pain,  Need to rule out cervical spondylitic myelopathy  MRI of cervical spine   Marcial Pacas, M.D. Ph.D.  [facility] [address], [place], [place] [address] Ph: [phone] Fax: [phone]  CC: McGowen,  Adrian Blackwater, MD

## 2019-08-28 ENCOUNTER — Telehealth: Payer: Self-pay | Admitting: Neurology

## 2019-08-28 NOTE — Telephone Encounter (Signed)
[employer] medicare order sent to [facility]. No auth they will reach out to the patient to schedule.  

## 2019-09-04 ENCOUNTER — Ambulatory Visit: Payer: Medicare Other | Admitting: Family Medicine

## 2019-09-15 ENCOUNTER — Ambulatory Visit
Admission: RE | Admit: 2019-09-15 | Discharge: 2019-09-15 | Disposition: A | Discharge status: Home / Self Care | Payer: Medicare Other | Source: Ambulatory Visit | Attending: Neurology | Admitting: Neurology

## 2019-09-15 ENCOUNTER — Other Ambulatory Visit: Payer: Self-pay

## 2019-09-15 DIAGNOSIS — G3281 Cerebellar ataxia in diseases classified elsewhere: Secondary | ICD-10-CM

## 2019-09-17 ENCOUNTER — Telehealth: Payer: Self-pay | Admitting: Neurology

## 2019-09-17 NOTE — Telephone Encounter (Signed)
Pt son Lissa Hoard returning call please call back

## 2019-09-17 NOTE — Telephone Encounter (Signed)
Please call patient, MRI of cervical spine showed mild degenerative changes, no evidence of cord or spinal nerve root compresssion  IMPRESSION: This MRI of the cervical spine without contrast shows the following: 1.   The spinal cord appears normal. 2.   At C5-C6 and C6-C7, there is mild spinal stenosis due to degenerative changes but no nerve root compression. 3.   There are milder degenerative changes at the other cervical levels without causing spinal stenosis or nerve root compression. 4.    Hyperintense signal changes within the pons consistent with chronic microvascular ischemic change.

## 2019-09-17 NOTE — Telephone Encounter (Signed)
Left message for her son, Nolyn Griffy (on [place]), to call me back to discuss results.

## 2019-09-17 NOTE — Telephone Encounter (Signed)
I spoke to her son and he verbalized understanding of the MRI results below.

## 2019-09-20 ENCOUNTER — Other Ambulatory Visit: Payer: Self-pay | Admitting: Neurology

## 2019-10-03 ENCOUNTER — Encounter: Payer: Self-pay | Admitting: Family Medicine

## 2019-10-03 ENCOUNTER — Ambulatory Visit: Payer: Medicare Other | Admitting: Family Medicine

## 2019-10-03 DIAGNOSIS — Z0289 Encounter for other administrative examinations: Secondary | ICD-10-CM

## 2019-10-03 NOTE — Progress Notes (Deleted)
OFFICE VISIT  10/03/2019   CC: No chief complaint on file.    HPI:    Patient is a 83 y.o. Caucasian female who presents for ***  Past Medical History:  Diagnosis Date  . Anxiety and depression   . Benign carcinoid tumor of duodenum   . Carpal tunnel syndrome on left   . Cervical disc disease   . Chronic renal insufficiency, stage 3 (moderate) (HCC)    GFR 50s  . Dementia without behavioral disturbance ([place])   . Diabetic neuropathy ([place])   . Diverticulosis   . GERD (gastroesophageal reflux disease)    with HH  . History of uterine fibroid   . History of wrist fracture    LEFT --  09-02-2013/   RESIDUAL DISCOMFORT  . Hyperlipidemia   . Hypertension   . Memory changes   . OA (osteoarthritis) of knee    left- Dr Latanya Maudlin  . OSA on CPAP   . Osteoporosis    due for repeat DEXA after 10/2019  . Type 2 diabetes mellitus (HCC)    with DPN    Past Surgical History:  Procedure Laterality Date  . ABDOMINAL HYSTERECTOMY  1970's   AND APPENDECTOMY  . BREAST REDUCTION SURGERY  1995  . CARDIOVASCULAR STRESS TEST  12-21-2007   NORMAL NUCLEAR STUDY/  NO ISCHEMIA/  EF 76%  . CARPAL TUNNEL RELEASE Left 04/03/2014   Procedure: LEFT CARPAL TUNNEL RELEASE;  Surgeon: Linna Hoff, MD;  Location: [facility];  Service: Orthopedics;  Laterality: Left;  ANESTHESIA: LOCAL WITH IV SEDATION  . COLONOSCOPY  05/15/2018   diverticulosis.  No further screening colonoscopies needed.  . DG  BONE DENSITY ([place] HX)  10/25/2017   Repeat 10/2019  . EUS N/A 11/05/2015   Procedure: UPPER ENDOSCOPIC ULTRASOUND (EUS) LINEAR;  Surgeon: Milus Banister, MD;  Location: [facility];  Service: Endoscopy;  Laterality: N/A;  . EXPLORATORY LAPAROTOMY/  LYSIS ADHESIONS/  BILATERAL SALPINGOOPHORECTOMY  1980's  . KNEE ARTHROSCOPY W/ MENISCECTOMY Left 01-03-2006  . NASAL SINUS SURGERY  1990  . REDUCTION MAMMAPLASTY    . TONSILLECTOMY  AGE 76  . TOTAL KNEE ARTHROPLASTY Bilateral RIGHT   10-19-2004/   LEFT  10-31-2007  . TRANSTHORACIC ECHOCARDIOGRAM  12-21-2007   MILD TO MODERATE LAE/  EF 55%/  MILD AV CALCIFICATION WITH NO STENOSIS    Outpatient Medications Prior to Visit  Medication Sig Dispense Refill  . ALPRAZolam (XANAX) 0.5 MG tablet Take 0.5 mg by mouth daily.    Marland Kitchen amLODipine (NORVASC) 5 MG tablet TAKE 1 TABLET (5 MG TOTAL) BY MOUTH DAILY. 90 tablet 1  . aspirin 81 MG tablet Take 81 mg by mouth daily.      Marland Kitchen CALCIUM PO Take 1 tablet by mouth daily.    Marland Kitchen donepezil (ARICEPT) 10 MG tablet Take 1 tablet (10 mg total) by mouth at bedtime. 30 tablet 11  . losartan-hydrochlorothiazide (HYZAAR) 100-12.5 MG tablet TAKE 1 TABLET BY MOUTH DAILY. 90 tablet 0  . memantine (NAMENDA) 10 MG tablet TAKE 1 TABLET BY MOUTH TWICE A DAY 180 tablet 3  . omeprazole (PRILOSEC) 40 MG capsule Take 40 mg by mouth daily.    . pravastatin (PRAVACHOL) 80 MG tablet TAKE 1 TABLET BY MOUTH EVERY EVENING 90 tablet 2  . Venlafaxine HCl 225 MG TB24 Take 225 mg by mouth daily.     No facility-administered medications prior to visit.     Allergies  Allergen Reactions  . Lipitor [Atorvastatin] Other (  See Comments)    memory  . Pioglitazone Other (See Comments)    HEADACHE Actos REACTION: headache  . Ace Inhibitors Cough    Lotensin REACTION: cough  . Ambien [Zolpidem Tartrate] Other (See Comments)    unknown  . Bupropion Hcl Other (See Comments)    Wellbutrin REACTION: hallucinations    ROS As per HPI  PE: There were no vitals taken for this visit. ***  LABS:  Lab Results  Component Value Date   TSH 1.36 06/05/2019   Lab Results  Component Value Date   WBC 10.7 08/23/2019   HGB 14.2 08/23/2019   HCT 42.6 08/23/2019   MCV 86.4 08/23/2019   PLT 237 08/23/2019   Lab Results  Component Value Date   IRON 53 08/23/2019   TIBC 339 08/23/2019   FERRITIN 92 08/23/2019   Lab Results  Component Value Date   VITAMINB12 452 08/23/2019    Lab Results  Component Value Date    CREATININE 1.01 (H) 08/23/2019   BUN 20 08/23/2019   NA 138 08/23/2019   K 4.4 08/23/2019   CL 102 08/23/2019   CO2 27 08/23/2019   Lab Results  Component Value Date   ALT 9 08/23/2019   AST 21 08/23/2019   ALKPHOS 66 02/13/2019   BILITOT 1.0 08/23/2019   Lab Results  Component Value Date   CHOL 144 02/13/2019   Lab Results  Component Value Date   HDL 53 02/13/2019   Lab Results  Component Value Date   LDLCALC 67 02/13/2019   Lab Results  Component Value Date   TRIG 119 02/13/2019   Lab Results  Component Value Date   CHOLHDL 3.3 04/15/2015   Lab Results  Component Value Date   ESRSEDRATE 9 08/23/2019     IMPRESSION AND PLAN:  No problem-specific Assessment & Plan notes found for this encounter.   An After Visit Summary was printed and given to the patient.  FOLLOW UP: No follow-ups on file.

## 2019-10-10 ENCOUNTER — Ambulatory Visit: Payer: Medicare Other | Admitting: Family Medicine

## 2019-10-16 ENCOUNTER — Encounter: Payer: Self-pay | Admitting: Family Medicine

## 2019-10-16 ENCOUNTER — Other Ambulatory Visit: Payer: Self-pay

## 2019-10-16 ENCOUNTER — Ambulatory Visit (INDEPENDENT_AMBULATORY_CARE_PROVIDER_SITE_OTHER): Payer: Medicare Other | Admitting: Family Medicine

## 2019-10-16 VITALS — BP 146/74 | HR 84 | Temp 98.6°F | Resp 16 | Ht 63.0 in | Wt 146.6 lb

## 2019-10-16 DIAGNOSIS — R296 Repeated falls: Secondary | ICD-10-CM | POA: Diagnosis not present

## 2019-10-16 DIAGNOSIS — E119 Type 2 diabetes mellitus without complications: Secondary | ICD-10-CM

## 2019-10-16 DIAGNOSIS — N183 Chronic kidney disease, stage 3 unspecified: Secondary | ICD-10-CM

## 2019-10-16 DIAGNOSIS — Z23 Encounter for immunization: Secondary | ICD-10-CM

## 2019-10-16 DIAGNOSIS — R2681 Unsteadiness on feet: Secondary | ICD-10-CM | POA: Diagnosis not present

## 2019-10-16 DIAGNOSIS — R413 Other amnesia: Secondary | ICD-10-CM

## 2019-10-16 DIAGNOSIS — I10 Essential (primary) hypertension: Secondary | ICD-10-CM

## 2019-10-16 NOTE — Progress Notes (Signed)
OFFICE VISIT  10/16/2019   CC:  Chief Complaint  Patient presents with  . Follow-up    RCI, pt is not fasting    HPI:    Patient is a 83 y.o. Caucasian female who presents accompanied by her son for 3 mo f/u HTN, CRI III, DM 2. She has been getting neuro eval over the last year or so (multiple neuro's) and most recently was started on aricept and namenda by Dr. Krista Blue with [facility]. Also had fatigue last time I saw her and I felt she had more of the "dwindles" as a result of covid 19 crisis-related issues/isolation, etc. No med changes were made.  Labs at that time all normal (see below).  She did not return any of the hemoccults that I ordered last visit, though.  Interim hx: Doing better.  Says she feels better on namenda. Says she is not taking the aricept. She and her son can tell a difference.  Talking more, gets less frustrated b/c memory is a little better.  No signif anxiety.  Not taking alprazolam any.  No checking glucoses or bp's. Still has gait instability but no falls.  She wants to get back into PT.  ROS: no CP, no SOB, no wheezing, no cough, no dizziness, no HAs, no rashes, no melena/hematochezia.  No polyuria or polydipsia.  No myalgias or arthralgias.  Past Medical History:  Diagnosis Date  . Anxiety and depression   . Benign carcinoid tumor of duodenum   . Carpal tunnel syndrome on left   . Cervical disc disease    MRI 08/2019->2 levels of mild spinal stenosis w/out any nerve or cord compression, other levels milder sponylotic changes.  . Chronic renal insufficiency, stage 3 (moderate)    GFR 50s  . CVA (cerebral vascular accident) ([place])    pt w/out hx of sx's.  Chronic lacunar infarct on MRI brain 2020.  . Diabetic neuropathy ([place])   . Diverticulosis   . GERD (gastroesophageal reflux disease)    with HH  . History of uterine fibroid   . History of wrist fracture    LEFT --  09-02-2013/   RESIDUAL DISCOMFORT  . Hyperlipidemia   .  Hypertension   . Memory loss    Per Dr. Krista Blue 08/2019->"cerebellar ataxia, memory impairment", MRI brain with generalized atrophy, no reversible causes of memory imp->"Most consistent with central nervous system degenerative disorder", pt was started on aricept and namenda.  . OA (osteoarthritis) of knee    left- Dr Latanya Maudlin  . OSA on CPAP   . Osteoporosis    due for repeat DEXA after 10/2019  . Type 2 diabetes mellitus (HCC)    with DPN    Past Surgical History:  Procedure Laterality Date  . ABDOMINAL HYSTERECTOMY  1970's   AND APPENDECTOMY  . BREAST REDUCTION SURGERY  1995  . CARDIOVASCULAR STRESS TEST  12-21-2007   NORMAL NUCLEAR STUDY/  NO ISCHEMIA/  EF 76%  . CARPAL TUNNEL RELEASE Left 04/03/2014   Procedure: LEFT CARPAL TUNNEL RELEASE;  Surgeon: Linna Hoff, MD;  Location: [facility];  Service: Orthopedics;  Laterality: Left;  ANESTHESIA: LOCAL WITH IV SEDATION  . COLONOSCOPY  05/15/2018   diverticulosis.  No further screening colonoscopies needed.  . DG  BONE DENSITY ([place] HX)  10/25/2017   Repeat 10/2019  . EUS N/A 11/05/2015   Procedure: UPPER ENDOSCOPIC ULTRASOUND (EUS) LINEAR;  Surgeon: Milus Banister, MD;  Location: [facility];  Service: Endoscopy;  Laterality: N/A;  . EXPLORATORY LAPAROTOMY/  LYSIS ADHESIONS/  BILATERAL SALPINGOOPHORECTOMY  1980's  . KNEE ARTHROSCOPY W/ MENISCECTOMY Left 01-03-2006  . NASAL SINUS SURGERY  1990  . REDUCTION MAMMAPLASTY    . TONSILLECTOMY  AGE 69  . TOTAL KNEE ARTHROPLASTY Bilateral RIGHT  10-19-2004/   LEFT  10-31-2007  . TRANSTHORACIC ECHOCARDIOGRAM  12-21-2007   MILD TO MODERATE LAE/  EF 55%/  MILD AV CALCIFICATION WITH NO STENOSIS    Outpatient Medications Prior to Visit  Medication Sig Dispense Refill  . amLODipine (NORVASC) 5 MG tablet TAKE 1 TABLET (5 MG TOTAL) BY MOUTH DAILY. 90 tablet 1  . aspirin 81 MG tablet Take 81 mg by mouth daily.      Marland Kitchen CALCIUM PO Take 1 tablet by mouth daily.    Marland Kitchen  losartan-hydrochlorothiazide (HYZAAR) 100-12.5 MG tablet TAKE 1 TABLET BY MOUTH DAILY. 90 tablet 0  . memantine (NAMENDA) 10 MG tablet TAKE 1 TABLET BY MOUTH TWICE A DAY 180 tablet 3  . omeprazole (PRILOSEC) 40 MG capsule Take 40 mg by mouth daily.    . pravastatin (PRAVACHOL) 80 MG tablet TAKE 1 TABLET BY MOUTH EVERY EVENING 90 tablet 2  . Venlafaxine HCl 225 MG TB24 Take 225 mg by mouth daily.    Marland Kitchen ALPRAZolam (XANAX) 0.5 MG tablet Take 0.5 mg by mouth daily.    Marland Kitchen donepezil (ARICEPT) 10 MG tablet Take 1 tablet (10 mg total) by mouth at bedtime. 30 tablet 11   No facility-administered medications prior to visit.     Allergies  Allergen Reactions  . Lipitor [Atorvastatin] Other (See Comments)    memory  . Pioglitazone Other (See Comments)    HEADACHE Actos REACTION: headache  . Ace Inhibitors Cough    Lotensin REACTION: cough  . Ambien [Zolpidem Tartrate] Other (See Comments)    unknown  . Bupropion Hcl Other (See Comments)    Wellbutrin REACTION: hallucinations    ROS As per HPI  PE: Blood pressure (!) 146/74, pulse 84, temperature 98.6 F (37 C), temperature source Temporal, resp. rate 16, height 5\' 3"  (1.6 m), weight 146 lb 9.6 oz (66.5 kg), SpO2 100 %. Body mass index is 25.97 kg/m. REpeat bp manually today was 124/74. Gen: Alert, well appearing.  Patient is oriented to person, place, time, and situation. AFFECT: pleasant, lucid thought and speech. CV: RRR, no m/r/g.   LUNGS: CTA bilat, nonlabored resps, good aeration in all lung fields. EXT: no clubbing or cyanosis.  no edema.    LABS:  Lab Results  Component Value Date   TSH 1.36 06/05/2019   Lab Results  Component Value Date   WBC 10.7 08/23/2019   HGB 14.2 08/23/2019   HCT 42.6 08/23/2019   MCV 86.4 08/23/2019   PLT 237 08/23/2019   Lab Results  Component Value Date   IRON 53 08/23/2019   TIBC 339 08/23/2019   FERRITIN 92 08/23/2019   Lab Results  Component Value Date   VITAMINB12 452 08/23/2019    Lab Results  Component Value Date   ESRSEDRATE 9 08/23/2019    Lab Results  Component Value Date   CREATININE 1.01 (H) 08/23/2019   BUN 20 08/23/2019   NA 138 08/23/2019   K 4.4 08/23/2019   CL 102 08/23/2019   CO2 27 08/23/2019   Lab Results  Component Value Date   ALT 9 08/23/2019   AST 21 08/23/2019   ALKPHOS 66 02/13/2019   BILITOT 1.0 08/23/2019   Lab Results  Component Value Date   CHOL 144 02/13/2019   Lab Results  Component Value Date   HDL 53 02/13/2019   Lab Results  Component Value Date   LDLCALC 67 02/13/2019   Lab Results  Component Value Date   TRIG 119 02/13/2019   Lab Results  Component Value Date   CHOLHDL 3.3 04/15/2015   Lab Results  Component Value Date   HGBA1C 5.5 08/23/2019   IMPRESSION AND PLAN:  1) HTN; The current medical regimen is effective;  continue present plan and medications.  2) CRI III: renal function stable 08/23/19. Plan repeat at next visit in 3 mo. Doing fine with fluids and avoidance of NSAIDs.  3) DM: A1c reached peak of 6.5% in 2016. It has been <6% all of this year. Plan repeat at next f/u in 3 mo.  4) Memory impairment, w/out impairment of ADLs. Improved some on namenda.  Continue with neuro f/u.  5) Gait instability, hx of recurrent falls but none lately. Referred back to PT today.  An After Visit Summary was printed and given to the patient.  FOLLOW UP: Return in about 3 months (around 01/16/2020) for annual CPE (fasting).  Signed:  Crissie Sickles, MD           10/16/2019

## 2019-10-16 NOTE — Addendum Note (Signed)
Addended by: Deveron Furlong D on: 10/16/2019 02:58 PM   Modules accepted: Orders

## 2019-10-21 ENCOUNTER — Telehealth: Payer: Self-pay | Admitting: Family Medicine

## 2019-10-21 NOTE — Telephone Encounter (Signed)
Would you like for patient to come back in for a visit or a vv? Please advise, thank you

## 2019-10-21 NOTE — Telephone Encounter (Signed)
Patient forgot to mention about something at her appt with Dr. Anitra Lauth on Friday 10/30, because of her dementia.  She reports that she has no energy when she wakes up in the morning. Reports sometimes she will just fall asleep in her chair about mid morning and/or before Noon.  Inquiring about if maybe there is some tests (lab work) maybe Dr. Anitra Lauth can order for him to check on her.  Maybe its medication, she is not sure. She is sorry for not bringing up on Friday.  Please call patient at [phone].  Thank you

## 2019-10-21 NOTE — Telephone Encounter (Signed)
She has complained of this before and I did an extensive lab workup and all was normal. Reassure her.  Her namenda and venlafaxine CAN cause drowsiness but as long as it is tolerable and only in morning time then I don't recommend she stop either of these meds, esp since they are helping her. Reassure her that no further testing is needed at this time.-thx

## 2019-10-22 NOTE — Telephone Encounter (Signed)
Called patient and spoke with her son. He verbalized understanding of Dr. Idelle Leech message and will tell Mrs. Padgett.

## 2019-10-25 ENCOUNTER — Telehealth: Payer: Self-pay | Admitting: Family Medicine

## 2019-10-25 NOTE — Telephone Encounter (Signed)
LM for pt's son to return call.  

## 2019-10-25 NOTE — Telephone Encounter (Signed)
Pt was called and told to throw away Thursdays medication (flush it) and Friday mornings (flush it). Pt was advised to start on Friday PM medications and then tomorrow to take her morning pills like normal. Pt stated " she is just stupid because she has alzheimer's". I calmed patient down and reassured her everything was okay and she could just start over with her medications tonight. Patient was calm when this nurse got off the phone with her. Pts son was called and advised of situation.    Sent to Dr Anitra Lauth as a Juluis Rainier

## 2019-10-25 NOTE — Telephone Encounter (Signed)
Patient called asking if she should take all of the pills in her Thursday and Friday pill container. There are 5 pills in each container. The patient didn't know the name of each pill but gave me what was printed on each one: 05304, Asp 81, L, M42, 10174 tiny grey pill. I advised her that I would have a CMA call her back. Patient will be home until 4:00.

## 2019-10-28 NOTE — Telephone Encounter (Signed)
Noted agree

## 2019-11-04 DIAGNOSIS — R262 Difficulty in walking, not elsewhere classified: Secondary | ICD-10-CM | POA: Diagnosis not present

## 2019-11-06 DIAGNOSIS — R262 Difficulty in walking, not elsewhere classified: Secondary | ICD-10-CM | POA: Diagnosis not present

## 2019-11-11 DIAGNOSIS — R262 Difficulty in walking, not elsewhere classified: Secondary | ICD-10-CM | POA: Diagnosis not present

## 2019-11-20 ENCOUNTER — Other Ambulatory Visit: Payer: Self-pay | Admitting: Family Medicine

## 2019-11-20 DIAGNOSIS — R262 Difficulty in walking, not elsewhere classified: Secondary | ICD-10-CM | POA: Diagnosis not present

## 2019-11-20 NOTE — Telephone Encounter (Signed)
RF request for Pravastatin LOV:10/16/19 Next ov: 01/22/20 Last written: 01/24/16 (90,2)  Medication originally prescribed by Dr.Metheney.Please advise if appropriate, thanks. Medication pending

## 2019-11-25 ENCOUNTER — Telehealth: Payer: Self-pay | Admitting: Family Medicine

## 2019-11-25 NOTE — Telephone Encounter (Signed)
Patient is no longer driving so she would like to close her PO Box and move all of her mail service to her house. Patient needs a letter stating that can no longer drive to the post office addressed to the [employer], [place] . Email letter to [email or web address] please.

## 2019-11-25 NOTE — Telephone Encounter (Signed)
Letter completed, will place in bin to go up front to be emailed.

## 2019-11-25 NOTE — Telephone Encounter (Signed)
Please Advise if okay for letter, thanks.

## 2019-11-25 NOTE — Telephone Encounter (Signed)
Yes, letter ok.  No specific medical info needs to be in the letter, though.

## 2019-11-26 ENCOUNTER — Telehealth: Payer: Self-pay | Admitting: Family Medicine
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
## 2019-11-26 NOTE — Telephone Encounter (Signed)
FYI. Please see below.  Spoke with patient, she just stated she has never taken more than 1 at a time of xanax prescription. She believes she was taken this for anxiety in the past. She was scheduled for telephone visit on 12/10 to further discuss.

## 2019-11-26 NOTE — Telephone Encounter (Signed)
Last Rx given was 12/08/15-03/14/16 by Dr.Metheney. lat visit on 10/16/19, noted " no signif anxiety, not taking alprazolam any."  Please advise, thanks.

## 2019-11-26 NOTE — Telephone Encounter (Signed)
Make sure patient clearly understands that xanax does not help with sad/depressed mood. It can help with severe anxiety BUT it can have significant (potentially dangerous) side effects in elderly people. I won't fill unless she gives clear info back to [place]. May need telephone visit.

## 2019-11-27 NOTE — Telephone Encounter (Signed)
Noted  

## 2019-11-28 ENCOUNTER — Other Ambulatory Visit: Payer: Self-pay

## 2019-11-28 ENCOUNTER — Ambulatory Visit (INDEPENDENT_AMBULATORY_CARE_PROVIDER_SITE_OTHER): Payer: Medicare Other | Admitting: Family Medicine

## 2019-11-28 ENCOUNTER — Encounter: Payer: Self-pay | Admitting: Family Medicine

## 2019-11-28 DIAGNOSIS — R4586 Emotional lability: Secondary | ICD-10-CM

## 2019-11-28 DIAGNOSIS — F0390 Unspecified dementia without behavioral disturbance: Secondary | ICD-10-CM

## 2019-11-28 DIAGNOSIS — F411 Generalized anxiety disorder: Secondary | ICD-10-CM | POA: Diagnosis not present

## 2019-11-28 MED ORDER — ALPRAZOLAM 0.5 MG PO TABS: ORAL_TABLET | ORAL | 1 refills | Status: DC
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
## 2019-12-17 DIAGNOSIS — H40022 Open angle with borderline findings, high risk, left eye: Secondary | ICD-10-CM | POA: Diagnosis not present

## 2019-12-17 DIAGNOSIS — H04123 Dry eye syndrome of bilateral lacrimal glands: Secondary | ICD-10-CM | POA: Diagnosis not present

## 2019-12-17 DIAGNOSIS — H52203 Unspecified astigmatism, bilateral: Secondary | ICD-10-CM | POA: Diagnosis not present

## 2019-12-17 DIAGNOSIS — Z961 Presence of intraocular lens: Secondary | ICD-10-CM | POA: Diagnosis not present

## 2019-12-26 ENCOUNTER — Telehealth: Payer: Self-pay

## 2019-12-26 NOTE — Telephone Encounter (Signed)
[place] [facility] called and stated the high dose medication, Venlafaxine 225mg  would be another tier for patient and cost over $100 monthly  150mg  Venlafaxine is covered under her plan  [phone] call back if needed

## 2019-12-27 ENCOUNTER — Telehealth: Payer: Self-pay

## 2019-12-27 NOTE — Telephone Encounter (Signed)
Pt has already picked up med for 225mg  dose, Venlafaxine but would like to further discuss possible dose change during visit on 01/22/20

## 2019-12-27 NOTE — Telephone Encounter (Signed)
Please advise, thanks.

## 2019-12-27 NOTE — Telephone Encounter (Signed)
Patient is concerned that she has been on calcium a long time. She would like to know if it safe for her to take it for such a long time and should she keep taking it.

## 2019-12-28 NOTE — Telephone Encounter (Signed)
Reassure her it is fine to continue taking calcium every day.

## 2019-12-30 NOTE — Telephone Encounter (Signed)
Pt's son, Lissa Hoard advised and will notify pt.

## 2020-01-22 ENCOUNTER — Ambulatory Visit: Payer: Medicare Other | Admitting: Family Medicine

## 2020-01-22 ENCOUNTER — Encounter: Payer: Self-pay | Admitting: Family Medicine

## 2020-01-22 ENCOUNTER — Other Ambulatory Visit: Payer: Self-pay

## 2020-01-22 VITALS — BP 123/65 | HR 90 | Temp 97.9°F | Resp 16 | Ht 63.0 in | Wt 144.2 lb

## 2020-01-22 DIAGNOSIS — E118 Type 2 diabetes mellitus with unspecified complications: Secondary | ICD-10-CM | POA: Diagnosis not present

## 2020-01-22 DIAGNOSIS — I10 Essential (primary) hypertension: Secondary | ICD-10-CM

## 2020-01-22 DIAGNOSIS — Z0000 Encounter for general adult medical examination without abnormal findings: Secondary | ICD-10-CM

## 2020-01-22 DIAGNOSIS — E7800 Pure hypercholesterolemia, unspecified: Secondary | ICD-10-CM | POA: Diagnosis not present

## 2020-01-22 DIAGNOSIS — M810 Age-related osteoporosis without current pathological fracture: Secondary | ICD-10-CM

## 2020-01-22 DIAGNOSIS — E348 Other specified endocrine disorders: Secondary | ICD-10-CM

## 2020-01-22 DIAGNOSIS — Z1231 Encounter for screening mammogram for malignant neoplasm of breast: Secondary | ICD-10-CM

## 2020-01-22 LAB — CBC WITH DIFFERENTIAL/PLATELET
Basophils Absolute: 0 10*3/uL (ref 0.0–0.1)
Basophils Relative: 0.6 % (ref 0.0–3.0)
Eosinophils Absolute: 0.1 10*3/uL (ref 0.0–0.7)
Eosinophils Relative: 1.3 % (ref 0.0–5.0)
HCT: 43.8 % (ref 36.0–46.0)
Hemoglobin: 14.6 g/dL (ref 12.0–15.0)
Lymphocytes Relative: 18.1 % (ref 12.0–46.0)
Lymphs Abs: 1.2 10*3/uL (ref 0.7–4.0)
MCHC: 33.3 g/dL (ref 30.0–36.0)
MCV: 86.1 fl (ref 78.0–100.0)
Monocytes Absolute: 0.6 10*3/uL (ref 0.1–1.0)
Monocytes Relative: 8.3 % (ref 3.0–12.0)
Neutro Abs: 4.9 10*3/uL (ref 1.4–7.7)
Neutrophils Relative %: 71.7 % (ref 43.0–77.0)
Platelets: 196 10*3/uL (ref 150.0–400.0)
RBC: 5.09 Mil/uL (ref 3.87–5.11)
RDW: 13.9 % (ref 11.5–15.5)
WBC: 6.8 10*3/uL (ref 4.0–10.5)

## 2020-01-22 LAB — LIPID PANEL
Cholesterol: 146 mg/dL (ref 0–200)
HDL: 55.6 mg/dL (ref >39.00)
LDL Cholesterol: 70 mg/dL (ref 0–99)
NonHDL: 89.91
Total CHOL/HDL Ratio: 3
Triglycerides: 102 mg/dL (ref 0.0–149.0)
VLDL: 20.4 mg/dL (ref 0.0–40.0)

## 2020-01-22 LAB — COMPREHENSIVE METABOLIC PANEL
ALT: 10 U/L (ref 0–35)
AST: 20 U/L (ref 0–37)
Albumin: 4.7 g/dL (ref 3.5–5.2)
Alkaline Phosphatase: 75 U/L (ref 39–117)
BUN: 21 mg/dL (ref 6–23)
CO2: 30 mEq/L (ref 19–32)
Calcium: 10.3 mg/dL (ref 8.4–10.5)
Chloride: 102 mEq/L (ref 96–112)
Creatinine, Ser: 1.05 mg/dL (ref 0.40–1.20)
GFR: 50.23 mL/min — ABNORMAL LOW (ref >60.00)
Glucose, Bld: 104 mg/dL — ABNORMAL HIGH (ref 70–99)
Potassium: 3.7 mEq/L (ref 3.5–5.1)
Sodium: 140 mEq/L (ref 135–145)
Total Bilirubin: 0.9 mg/dL (ref 0.2–1.2)
Total Protein: 7.1 g/dL (ref 6.0–8.3)

## 2020-01-22 LAB — HEMOGLOBIN A1C: Hgb A1c MFr Bld: 5.7 % (ref 4.6–6.5)

## 2020-01-22 NOTE — Patient Instructions (Signed)
We are committed to keeping you informed about the COVID-19 vaccine.  As the vaccine continues to become available for each phase, we will ensure that patients who meet the criteria receive the information they need to access vaccination opportunities. Continue to check your MyChart account and [email or web address] for updates. Please review the Phase 1b information below.  [place] On Tuesday, Jan. 19, the [facility]) and [place] began large-scale COVID-19 vaccinations at the [place]. The vaccinations are appointment only and for those 50 and older.  Walk-ins will not be accepted.  All appointments are currently filled. Please join our waiting list for the next available appointments. We will contact you when appointments become available. Please do not sign up more than once.  Join Our Waiting List  Our daily vaccination capacity will continue to increase, including expansion of our [employer] site, increasing mobile clinics across our service region and plans to open COVID-19 vaccination clinics in [place] and [place] counties in February.  On Friday, Jan. 22, we announced the need to reschedule 10,400 vaccination appointments because of the state's decision not to allocate [facility] with COVID-19 vaccines for the week of Jan. 25. Our announcement of this news is available here.  Going forward, we will schedule vaccination appointments weekly based on vaccine allocations provided by the state.  On Friday, Jan. 29, the [place] announced that [employer] and [place] will again be receiving vaccines. Our announcement of this news is available here.  We will first schedule vaccination appointments for those whose appointments were cancelled last week, then schedule those on the  [place] waiting list in the order in which they registered. Those who had their appointment cancelled should expect notification by email (or by phone for those without email) at least 48 hours prior to their rescheduled appointment.  Other Vaccination Opportunities in [place] We are also working in partnership with county health agencies in our service counties to ensure continuing vaccination availability in the weeks and months ahead. Learn more about each county's vaccination efforts in the website links below:   [place]'s phase 1b vaccination guidelines, prioritizing those 65 and over as the next eligible group to receive the COVID-19 vaccine, are detailed at [email or web address].   Vaccine Safety and Effectiveness Clinical trials for the Pfizer COVID-19 vaccine involved 42,000 people and showed that the vaccine is more than 95% effective in preventing COVID-19 with no serious safety concerns. Similar results have been reported for the Moderna COVID-19 vaccine. Side effects reported in the [place] clinical trials include a sore arm at the injection site, fatigue, headache, chills and fever. While side effects from the [place] COVID-19 vaccine are higher than for a typical flu vaccine, they are lower in many ways than side effects from the leading vaccine to prevent shingles. Side effects are signs that a vaccine is working and are related to your immune system being stimulated to produce antibodies against infection. Side effects from vaccination are far less significant than health impacts from COVID-19.  Staying Informed Pharmacists, infectious disease doctors, critical care nurses and other experts at [facility] continue to speak publicly through media interviews and direct communication with our patients and communities about the safety, effectiveness and importance of vaccines to eliminate COVID-19. In addition,  reliable information  on vaccine safety, effectiveness, side effects and more is available on the following websites:  [place] Department of Health and Human Services COVID-19 Vaccine Information Website.  U.S. Centers for Disease Control and Prevention [id number] Human resources officer.  Staying Safe We agree with the CDC on what we can do to help our communities get back to normal: Getting "back to normal" is going to take all of our tools. If we use all the tools we have, we stand the best chance of getting our families, communities, schools and workplaces "back to normal" sooner:  Get vaccinated as soon as vaccines become available within the phase of the state's vaccination rollout plan for which you meet the eligibility criteria.  Wear a mask.  Stay 6 feet from others and avoid crowds.  Wash hands often.  For our most current information, please visit [email or web address].

## 2020-01-22 NOTE — Progress Notes (Signed)
Office Note 01/22/2020  CC:  Chief Complaint  Patient presents with  . Annual Exam    pt is fasting    HPI:  Christine Washington is a 84 y.o. White female who is here accompanied by her sone for annual health maintenance exam.  She has multiple chronic medical conditions, most notably dementia, DM 2, HTN, HLD, and CRI III.  Last visit I restarted her on alprazolam due to intermittent periods of anxiety/frustration/emotional lability. PMP AWARE reviewed today: most recent rx for alprazolam was filled 11/28/2019, # 21, rx by me. No red flags.  Past Medical History:  Diagnosis Date  . Anxiety and depression   . Benign carcinoid tumor of duodenum   . Carpal tunnel syndrome on left   . Cervical disc disease    MRI 08/2019->2 levels of mild spinal stenosis w/out any nerve or cord compression, other levels milder sponylotic changes.  . Chronic renal insufficiency, stage 3 (moderate)    GFR 50s  . CVA (cerebral vascular accident) ([place])    pt w/out hx of sx's.  Chronic lacunar infarct on MRI brain 2020.  . Diabetic neuropathy ([place])   . Diverticulosis   . GERD (gastroesophageal reflux disease)    with HH  . History of uterine fibroid   . History of wrist fracture    LEFT --  09-02-2013/   RESIDUAL DISCOMFORT  . Hyperlipidemia   . Hypertension   . Memory loss    Per Dr. Krista Blue 08/2019->"cerebellar ataxia, memory impairment", MRI brain with generalized atrophy, no reversible causes of memory imp->"Most consistent with central nervous system degenerative disorder", pt was started on aricept and namenda.  . OA (osteoarthritis) of knee    left- Dr Latanya Maudlin  . OSA on CPAP   . Osteoporosis    due for repeat DEXA after 10/2019  . Type 2 diabetes mellitus (HCC)    with DPN    Past Surgical History:  Procedure Laterality Date  . ABDOMINAL HYSTERECTOMY  1970's   AND APPENDECTOMY  . BREAST REDUCTION SURGERY  1995  . CARDIOVASCULAR STRESS TEST  12-21-2007   NORMAL NUCLEAR STUDY/  NO ISCHEMIA/   EF 76%  . CARPAL TUNNEL RELEASE Left 04/03/2014   Procedure: LEFT CARPAL TUNNEL RELEASE;  Surgeon: Linna Hoff, MD;  Location[facility];  Service: Orthopedics;  Laterality: Left;  ANESTHESIA: LOCAL WITH IV SEDATION  . COLONOSCOPY  05/15/2018   diverticulosis.  No further screening colonoscopies needed.  . DG  BONE DENSITY ([place] HX)  10/25/2017   Repeat 10/2019  . EUS N/A 11/05/2015   Procedure: UPPER ENDOSCOPIC ULTRASOUND (EUS) LINEAR;  Surgeon: Milus Banister, MD;  Location: [facility];  Service: Endoscopy;  Laterality: N/A;  . EXPLORATORY LAPAROTOMY/  LYSIS ADHESIONS/  BILATERAL SALPINGOOPHORECTOMY  1980's  . KNEE ARTHROSCOPY W/ MENISCECTOMY Left 01-03-2006  . NASAL SINUS SURGERY  1990  . REDUCTION MAMMAPLASTY    . TONSILLECTOMY  AGE 46  . TOTAL KNEE ARTHROPLASTY Bilateral RIGHT  10-19-2004/   LEFT  10-31-2007  . TRANSTHORACIC ECHOCARDIOGRAM  12-21-2007   MILD TO MODERATE LAE/  EF 55%/  MILD AV CALCIFICATION WITH NO STENOSIS    Family History  Problem Relation Age of Onset  . Hypertension Mother   . Stroke Mother   . Diabetes Mother   . Stroke Father 21  . Hypertension Father   . Cancer Father        melanoma  . Multiple sclerosis Sister  died  . COPD Sister   . Colon cancer Neg Hx     Social History   Socioeconomic History  . Marital status: Divorced    Spouse name: Not on file  . Number of children: 2  . Years of education: 32  . Highest education level: High school graduate  Occupational History  . Occupation: retired  Tobacco Use  . Smoking status: Never Smoker  . Smokeless tobacco: Never Used  Substance and Sexual Activity  . Alcohol use: No  . Drug use: No  . Sexual activity: Not on file  Other Topics Concern  . Not on file  Social History Narrative   Widow, has 2 grown sons (1 in the local [place], and one in [place]).   Lives by herself as of 05/2019.   No T/A/Ds.  No pets.   Right-handed.   One cup caffeine per  day.   Lives alone.            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 Her sister died in 12/14/2011.   Social Determinants of Health   Financial Resource Strain:   . Difficulty of Paying Living Expenses: Not on file  Food Insecurity:   . Worried About Charity fundraiser in the Last Year: Not on file  . Ran Out of Food in the Last Year: Not on file  Transportation Needs:   . Lack of Transportation (Medical): Not on file  . Lack of Transportation (Non-Medical): Not on file  Physical Activity:   . Days of Exercise per Week: Not on file  . Minutes of Exercise per Session: Not on file  Stress:   . Feeling of Stress : Not on file  Social Connections:   . Frequency of Communication with Friends and Family: Not on file  . Frequency of Social Gatherings with Friends and Family: Not on file  . Attends Religious Services: Not on file  . Active Member of Clubs or Organizations: Not on file  . Attends Archivist Meetings: Not on file  . Marital Status: Not on file  Intimate Partner Violence:   . Fear of Current or Ex-Partner: Not on file  . Emotionally Abused: Not on file  . Physically Abused: Not on file  . Sexually Abused: Not on file    Outpatient Medications Prior to Visit  Medication Sig Dispense Refill  . ALPRAZolam (XANAX) 0.5 MG tablet 1 tab po bid prn anxiety or excessive emotion 60 tablet 1  . amLODipine (NORVASC) 5 MG tablet TAKE 1 TABLET (5 MG TOTAL) BY MOUTH DAILY. 90 tablet 1  . aspirin 81 MG tablet Take 81 mg by mouth daily.      Marland Kitchen CALCIUM PO Take 1 tablet by mouth daily.    Marland Kitchen losartan-hydrochlorothiazide (HYZAAR) 100-12.5 MG tablet TAKE 1 TABLET BY MOUTH DAILY. 90 tablet 0  . memantine (NAMENDA) 10 MG tablet TAKE 1 TABLET BY MOUTH TWICE A DAY 180 tablet 3  . omeprazole (PRILOSEC) 40 MG capsule Take 40 mg by mouth daily.    . pravastatin (PRAVACHOL) 80 MG tablet TAKE 1 TABLET ONCE A DAY EVERY EVENING ORALLY 90 tablet 3  . Venlafaxine HCl  225 MG TB24 Take 225 mg by mouth daily.     No facility-administered medications prior to visit.    Allergies  Allergen Reactions  . Lipitor [Atorvastatin] Other (See Comments)    memory  . Pioglitazone Other (See Comments)    HEADACHE Actos  REACTION: headache  . Ace Inhibitors Cough    Lotensin REACTION: cough  . Ambien [Zolpidem Tartrate] Other (See Comments)    unknown  . Bupropion Hcl Other (See Comments)    Wellbutrin REACTION: hallucinations    ROS Review of Systems  Constitutional: Negative for appetite change, chills, fatigue and fever.  HENT: Negative for congestion, dental problem, ear pain and sore throat.   Eyes: Negative for discharge, redness and visual disturbance.  Respiratory: Negative for cough, chest tightness, shortness of breath and wheezing.   Cardiovascular: Negative for chest pain, palpitations and leg swelling.  Gastrointestinal: Negative for abdominal pain, blood in stool, diarrhea, nausea and vomiting.  Genitourinary: Negative for difficulty urinating, dysuria, flank pain, frequency, hematuria and urgency.  Musculoskeletal: Negative for arthralgias, back pain, joint swelling, myalgias and neck stiffness.  Skin: Negative for pallor and rash.  Neurological: Negative for dizziness, speech difficulty, weakness and headaches.  Hematological: Negative for adenopathy. Does not bruise/bleed easily.  Psychiatric/Behavioral: Negative for confusion and sleep disturbance. The patient is not nervous/anxious.     PE; Blood pressure 123/65, pulse 90, temperature 97.9 F (36.6 C), temperature source Temporal, resp. rate 16, height 5\' 3"  (1.6 m), weight 144 lb 3.2 oz (65.4 kg), SpO2 94 %. Body mass index is 25.54 kg/m.  Gen: Alert, well appearing.  Patient is oriented to person, place, time, and situation. AFFECT: pleasant, lucid thought and speech. ENT: Ears: EACs clear, normal epithelium.  TMs with good light reflex and landmarks bilaterally.  Eyes: no  injection, icteris, swelling, or exudate.  EOMI, PERRLA. Nose: no drainage or turbinate edema/swelling.  No injection or focal lesion.  Mouth: lips without lesion/swelling.  Oral mucosa pink and moist.  Dentition intact and without obvious caries or gingival swelling.  Oropharynx without erythema, exudate, or swelling.  Neck: supple/nontender.  No LAD, mass, or TM.  Carotid pulses 2+ bilaterally, without bruits. CV: RRR, no m/r/g.   LUNGS: CTA bilat, nonlabored resps, good aeration in all lung fields. ABD: soft, NT, ND, BS normal.  No hepatospenomegaly or mass.  No bruits. EXT: no clubbing, cyanosis, or edema.  Musculoskeletal: no joint swelling, erythema, warmth, or tenderness.  ROM of all joints intact. Skin - no sores or suspicious lesions or rashes or color changes  Pertinent labs:  Lab Results  Component Value Date   TSH 1.36 06/05/2019   Lab Results  Component Value Date   WBC 10.7 08/23/2019   HGB 14.2 08/23/2019   HCT 42.6 08/23/2019   MCV 86.4 08/23/2019   PLT 237 08/23/2019   Lab Results  Component Value Date   CREATININE 1.01 (H) 08/23/2019   BUN 20 08/23/2019   NA 138 08/23/2019   K 4.4 08/23/2019   CL 102 08/23/2019   CO2 27 08/23/2019   Lab Results  Component Value Date   ALT 9 08/23/2019   AST 21 08/23/2019   ALKPHOS 66 02/13/2019   BILITOT 1.0 08/23/2019   Lab Results  Component Value Date   CHOL 144 02/13/2019   Lab Results  Component Value Date   HDL 53 02/13/2019   Lab Results  Component Value Date   LDLCALC 67 02/13/2019   Lab Results  Component Value Date   TRIG 119 02/13/2019   Lab Results  Component Value Date   CHOLHDL 3.3 04/15/2015   Lab Results  Component Value Date   HGBA1C 5.5 08/23/2019   ASSESSMENT AND PLAN:   Health maintenance exam: Reviewed age and gender appropriate health maintenance issues (prudent  diet, regular exercise, health risks of tobacco and excessive alcohol, use of seatbelts, fire alarms in home, use of  sunscreen).  Also reviewed age and gender appropriate health screening as well as vaccine recommendations. Vaccines: ALL UTD.  Encouraged pt to get covid 19 vaccine, gave info on this today. Labs:  CBC, CMET, FLP, A1c (DM 2)  Cervical ca screening:  Hx of hysterectomy for benign dx->no further pap/pelvic indicated. Breast ca screening: screening mammogram discussed but pt declines any further breast ca screening. Colon ca screening:  Screening no longer indicated. Osteoporosis->due for repeat DEXA->pt declines to have any further bone density testing.  An After Visit Summary was printed and given to the patient.  FOLLOW UP:  Return in about 4 months (around 05/21/2020).  Signed:  Crissie Sickles, MD           01/22/2020

## 2020-02-12 ENCOUNTER — Other Ambulatory Visit: Payer: Self-pay | Admitting: Family Medicine

## 2020-02-17 ENCOUNTER — Telehealth: Payer: Self-pay

## 2020-02-17 ENCOUNTER — Other Ambulatory Visit: Payer: Self-pay

## 2020-02-17 NOTE — Telephone Encounter (Signed)
Received One Touch strips from [facility]. Patient states that she is not supposed to be checking her sugar, she hasnt had to do this in a long time. Patient also reports she does have "alztheimer's" and "it kicks in sometimes."  She would like to verify with Dr. Anitra Lauth to be tested her sugar with one touch test strips.  Please call [phone].

## 2020-02-17 NOTE — Telephone Encounter (Signed)
Medication sent in error. Left detailed message on pt's son cell, okay per DPR. Nothing further needed.

## 2020-02-25 ENCOUNTER — Ambulatory Visit: Payer: Self-pay | Admitting: Neurology

## 2020-04-21 ENCOUNTER — Other Ambulatory Visit: Payer: Self-pay | Admitting: Family Medicine

## 2020-04-21 NOTE — Telephone Encounter (Signed)
RF request for Losartan-HCTZ LOV:01/22/20 Next ov: advised to f/u 54mo Last written:10/26/15, med not originally prescribed by you   RF request for Venlafaxine LOV:01/22/20 Next ov: advised to f/u 4 mo Last written: med not originally prescribed by you   Medications pending, please advise thanks.

## 2020-04-29 ENCOUNTER — Ambulatory Visit (INDEPENDENT_AMBULATORY_CARE_PROVIDER_SITE_OTHER): Payer: Medicare Other

## 2020-04-29 ENCOUNTER — Ambulatory Visit (INDEPENDENT_AMBULATORY_CARE_PROVIDER_SITE_OTHER): Payer: Medicare Other | Admitting: Family Medicine

## 2020-04-29 ENCOUNTER — Other Ambulatory Visit: Payer: Self-pay

## 2020-04-29 ENCOUNTER — Encounter: Payer: Self-pay | Admitting: Family Medicine

## 2020-04-29 VITALS — BP 117/71 | HR 65 | Temp 98.0°F | Resp 16 | Ht 63.0 in | Wt 148.2 lb

## 2020-04-29 DIAGNOSIS — M25532 Pain in left wrist: Secondary | ICD-10-CM | POA: Diagnosis not present

## 2020-04-29 DIAGNOSIS — M25531 Pain in right wrist: Secondary | ICD-10-CM | POA: Diagnosis not present

## 2020-04-29 NOTE — Progress Notes (Signed)
OFFICE VISIT  04/29/2020   CC:  Chief Complaint  Patient presents with  . Knot on arm    started c/o pain x1 mo. ago to son, has spot on left and right arm near wrist   HPI:    Patient is a 84 y.o. Caucasian female who presents accompanied by her son for "knot on arm". She has a firm nodular growth on each at distal aspect of ulna, present x years, hurt off and on but pain seems more persistent the last 1 mo, esp R wrist.  No soft tissue swelling of wrists, no redness or heat.  No recent injury.  ROM is not a problem, nor is she having any weakness. Pain not severe but she is complaining to her son more lately so he thought he'd bring her in to get it checked.  ROS: no fevers, night sweats, or abnl weight loss.    Past Medical History:  Diagnosis Date  . Anxiety and depression   . Benign carcinoid tumor of duodenum   . Carpal tunnel syndrome on left   . Cervical disc disease    MRI 08/2019->2 levels of mild spinal stenosis w/out any nerve or cord compression, other levels milder sponylotic changes.  . Chronic renal insufficiency, stage 3 (moderate)    GFR 50s  . CVA (cerebral vascular accident) ([place])    pt w/out hx of sx's.  Chronic lacunar infarct on MRI brain 2020.  . Diabetic neuropathy ([place])   . Diverticulosis   . GERD (gastroesophageal reflux disease)    with HH  . History of uterine fibroid   . History of wrist fracture    LEFT --  09-02-2013/   RESIDUAL DISCOMFORT  . Hyperlipidemia   . Hypertension   . Memory loss    Per Dr. Krista Blue 08/2019->"cerebellar ataxia, memory impairment", MRI brain with generalized atrophy, no reversible causes of memory imp->"Most consistent with central nervous system degenerative disorder", pt was started on aricept and namenda.  . OA (osteoarthritis) of knee    left- Dr Latanya Maudlin  . OSA on CPAP   . Osteoporosis    due for repeat DEXA after 10/2019--->PT DECLINED THIS  . Preventative health care    as of 01/2020 pt declines any further breast  ca screening and she's no longer a candidate for cerv ca scr.  She also declines bone density testing.  . Type 2 diabetes mellitus ([place])    with DPN    Past Surgical History:  Procedure Laterality Date  . ABDOMINAL HYSTERECTOMY  1970's   AND APPENDECTOMY  . BREAST REDUCTION SURGERY  1995  . CARDIOVASCULAR STRESS TEST  12-21-2007   NORMAL NUCLEAR STUDY/  NO ISCHEMIA/  EF 76%  . CARPAL TUNNEL RELEASE Left 04/03/2014   Procedure: LEFT CARPAL TUNNEL RELEASE;  Surgeon: Linna Hoff, MD;  Location: [facility];  Service: Orthopedics;  Laterality: Left;  ANESTHESIA: LOCAL WITH IV SEDATION  . COLONOSCOPY  05/15/2018   diverticulosis.  No further screening colonoscopies needed.  . DG  BONE DENSITY ([place] HX)  10/25/2017   Repeat was planned 10/2019 BUT PT DECLINED THIS TEST  . EUS N/A 11/05/2015   Procedure: UPPER ENDOSCOPIC ULTRASOUND (EUS) LINEAR;  Surgeon: Milus Banister, MD;  Location: [facility];  Service: Endoscopy;  Laterality: N/A;  . EXPLORATORY LAPAROTOMY/  LYSIS ADHESIONS/  BILATERAL SALPINGOOPHORECTOMY  1980's  . KNEE ARTHROSCOPY W/ MENISCECTOMY Left 01-03-2006  . NASAL SINUS SURGERY  1990  . REDUCTION MAMMAPLASTY    .  TONSILLECTOMY  AGE 23  . TOTAL KNEE ARTHROPLASTY Bilateral RIGHT  10-19-2004/   LEFT  10-31-2007  . TRANSTHORACIC ECHOCARDIOGRAM  12-21-2007   MILD TO MODERATE LAE/  EF 55%/  MILD AV CALCIFICATION WITH NO STENOSIS    Outpatient Medications Prior to Visit  Medication Sig Dispense Refill  . ALPRAZolam (XANAX) 0.5 MG tablet 1 tab po bid prn anxiety or excessive emotion 60 tablet 1  . amLODipine (NORVASC) 5 MG tablet TAKE 1 TABLET (5 MG TOTAL) BY MOUTH DAILY. 90 tablet 1  . aspirin 81 MG tablet Take 81 mg by mouth daily.      Marland Kitchen CALCIUM PO Take 1 tablet by mouth daily.    Marland Kitchen losartan-hydrochlorothiazide (HYZAAR) 100-12.5 MG tablet TAKE 1 TABLET BY MOUTH EVERY DAY 90 tablet 3  . memantine (NAMENDA) 10 MG tablet TAKE 1 TABLET BY MOUTH TWICE A DAY 180  tablet 3  . omeprazole (PRILOSEC) 40 MG capsule Take 40 mg by mouth daily.    . pravastatin (PRAVACHOL) 80 MG tablet TAKE 1 TABLET ONCE A DAY EVERY EVENING ORALLY 90 tablet 3  . Venlafaxine HCl 225 MG TB24 TAKE 1 TABLET BY MOUTH EVERY DAY WITH FOOD 90 tablet 3   No facility-administered medications prior to visit.    Allergies  Allergen Reactions  . Lipitor [Atorvastatin] Other (See Comments)    memory  . Pioglitazone Other (See Comments)    HEADACHE Actos REACTION: headache  . Ace Inhibitors Cough    Lotensin REACTION: cough  . Ambien [Zolpidem Tartrate] Other (See Comments)    unknown  . Bupropion Hcl Other (See Comments)    Wellbutrin REACTION: hallucinations    ROS As per HPI  PE: Blood pressure 117/71, pulse 65, temperature 98 F (36.7 C), temperature source Temporal, resp. rate 16, height 5\' 3"  (1.6 m), weight 148 lb 3.2 oz (67.2 kg), SpO2 98 %. Gen: Alert, well appearing.  Patient is oriented to person, place, time, and situation. AFFECT: pleasant, lucid thought and speech. At distal aspect of each ulna she has hypertrophy of the ulnar prominence, very firm but mildly tender to firm palpation. No fluctuance or erythema.  This does not feel like it moves in concert with wrist extension/flexion.  ROM of wrists intact. Fingers ROM fully intact.   LABS:    Chemistry      Component Value Date/Time   NA 140 01/22/2020 0904   NA 140 02/13/2019 0000   K 3.7 01/22/2020 0904   CL 102 01/22/2020 0904   CO2 30 01/22/2020 0904   BUN 21 01/22/2020 0904   BUN 28 (A) 02/13/2019 0000   CREATININE 1.05 01/22/2020 0904   CREATININE 1.01 (H) 08/23/2019 1516   GLU 106 02/13/2019 0000      Component Value Date/Time   CALCIUM 10.3 01/22/2020 0904   ALKPHOS 75 01/22/2020 0904   AST 20 01/22/2020 0904   ALT 10 01/22/2020 0904   BILITOT 0.9 01/22/2020 0904     Lab Results  Component Value Date   HGBA1C 5.7 01/22/2020    IMPRESSION AND PLAN:  Wrist pain focused at head  of ulna bilat: bony prominence palpable in this area bilat.   Unsure of significance/etiology of this but I'll check x-rays of each wrist today. If no clear abnormality/explanation on plain films then will ask orthopedist for help.  An After Visit Summary was printed and given to the patient.  FOLLOW UP: Return in about 2 months (around 06/29/2020) for routine chronic illness f/u.  Signed:  Crissie Sickles, MD           04/29/2020

## 2020-04-30 ENCOUNTER — Other Ambulatory Visit: Payer: Self-pay | Admitting: Family Medicine

## 2020-04-30 MED ORDER — DICLOFENAC SODIUM 1 % EX GEL: CUTANEOUS | 1 refills | Status: DC

## 2020-06-15 ENCOUNTER — Other Ambulatory Visit: Payer: Self-pay | Admitting: Family Medicine

## 2020-07-01 ENCOUNTER — Other Ambulatory Visit: Payer: Self-pay | Admitting: Family Medicine

## 2020-07-01 ENCOUNTER — Other Ambulatory Visit: Payer: Self-pay | Admitting: Neurology

## 2020-07-01 NOTE — Progress Notes (Signed)
  Subjective:  Patient ID: Christine Washington, female    DOB: 25-Oct-1936,  MRN: [id number]  Chief Complaint  Patient presents with  . Peripheral Neuropathy    nueropathy as well as a ulcer on the right foot plantar forefoot, pt has been dealing with it for years, pt states that it is an aching and throbbing pain, and that pain is elevated when she puts pressure on it    84 y.o. female presents with the above complaint. History confirmed with patient.   Objective:  Physical Exam: warm, good capillary refill, no trophic changes or ulcerative lesions, normal DP and PT pulses and absent protective sensation on sensory exam. HPK submet 2/3 bilat Left Foot: normal exam, no swelling, tenderness, instability; ligaments intact, full range of motion of all ankle/foot joints  Right Foot: normal exam, no swelling, tenderness, instability; ligaments intact, full range of motion of all ankle/foot joints   Assessment:   1. Capsulitis of metatarsophalangeal (MTP) joint   2. DM type 2 with diabetic peripheral neuropathy (HCC)   3. Callus      Plan:  Patient was evaluated and treated and all questions answered.  DM with DPN, HPK -DM Risk level 3 -Debrided with 312 blade without incident  No follow-ups on file.

## 2020-07-09 ENCOUNTER — Other Ambulatory Visit: Payer: Self-pay | Admitting: Family Medicine

## 2020-07-09 ENCOUNTER — Other Ambulatory Visit: Payer: Self-pay | Admitting: Neurology

## 2020-07-14 ENCOUNTER — Telehealth: Payer: Self-pay

## 2020-07-14 NOTE — Telephone Encounter (Signed)
LM for pt's son to return call.  

## 2020-07-14 NOTE — Telephone Encounter (Signed)
Patient's son Jacqulynn Cadet is bringing the patient to her appointment this week but he needs to talk Dr. Anitra Lauth before appointment to let him know "what is going on".

## 2020-07-15 NOTE — Telephone Encounter (Signed)
LM for pt's son to return call.  

## 2020-07-15 NOTE — Telephone Encounter (Signed)
Patient has an appt tomorrow and can discuss at that time.

## 2020-07-16 ENCOUNTER — Ambulatory Visit: Payer: Medicare Other | Admitting: Family Medicine

## 2020-07-20 ENCOUNTER — Encounter: Payer: Self-pay | Admitting: Family Medicine

## 2020-07-20 ENCOUNTER — Ambulatory Visit: Payer: Medicare Other | Admitting: Family Medicine

## 2020-07-20 ENCOUNTER — Other Ambulatory Visit: Payer: Self-pay

## 2020-07-20 VITALS — BP 116/71 | HR 76 | Temp 98.3°F | Resp 16 | Ht 63.0 in | Wt 146.6 lb

## 2020-07-20 DIAGNOSIS — H903 Sensorineural hearing loss, bilateral: Secondary | ICD-10-CM | POA: Diagnosis not present

## 2020-07-20 DIAGNOSIS — F419 Anxiety disorder, unspecified: Secondary | ICD-10-CM | POA: Diagnosis not present

## 2020-07-20 DIAGNOSIS — F329 Major depressive disorder, single episode, unspecified: Secondary | ICD-10-CM

## 2020-07-20 DIAGNOSIS — E119 Type 2 diabetes mellitus without complications: Secondary | ICD-10-CM | POA: Diagnosis not present

## 2020-07-20 DIAGNOSIS — K5909 Other constipation: Secondary | ICD-10-CM

## 2020-07-20 DIAGNOSIS — N1831 Chronic kidney disease, stage 3a: Secondary | ICD-10-CM

## 2020-07-20 DIAGNOSIS — F0390 Unspecified dementia without behavioral disturbance: Secondary | ICD-10-CM

## 2020-07-20 DIAGNOSIS — F32A Depression, unspecified: Secondary | ICD-10-CM

## 2020-07-20 DIAGNOSIS — L304 Erythema intertrigo: Secondary | ICD-10-CM

## 2020-07-20 DIAGNOSIS — I10 Essential (primary) hypertension: Secondary | ICD-10-CM | POA: Diagnosis not present

## 2020-07-20 DIAGNOSIS — E7800 Pure hypercholesterolemia, unspecified: Secondary | ICD-10-CM

## 2020-07-20 LAB — HEMOGLOBIN A1C: Hgb A1c MFr Bld: 5.6 % (ref 4.6–6.5)

## 2020-07-20 LAB — BASIC METABOLIC PANEL
BUN: 26 mg/dL — ABNORMAL HIGH (ref 6–23)
CO2: 31 mEq/L (ref 19–32)
Calcium: 9.9 mg/dL (ref 8.4–10.5)
Chloride: 105 mEq/L (ref 96–112)
Creatinine, Ser: 0.98 mg/dL (ref 0.40–1.20)
GFR: 54.33 mL/min — ABNORMAL LOW (ref >60.00)
Glucose, Bld: 82 mg/dL (ref 70–99)
Potassium: 4.2 mEq/L (ref 3.5–5.1)
Sodium: 143 mEq/L (ref 135–145)

## 2020-07-20 MED ORDER — CLOTRIMAZOLE-BETAMETHASONE 1-0.05 % EX CREA: 1.0000 "application " | TOPICAL_CREAM | Freq: Two times a day (BID) | CUTANEOUS | 2 refills | Status: AC

## 2020-07-20 MED ORDER — AMLODIPINE BESYLATE 5 MG PO TABS: ORAL_TABLET | ORAL | 3 refills | Status: AC

## 2020-07-20 NOTE — Progress Notes (Signed)
OFFICE VISIT  07/20/2020   CC:  Chief Complaint  Patient presents with  . Follow-up    RCI, pt is not fasting   HPI:    Patient is a 84 y.o. Caucasian female who presents accompanied by her son for 6 mo f/u dementia with anxiety and depression, DM 2, HTN, HLD, and CRI III.  Voltaren gel that I rx'd last visit has been helping wrists/hands some.  Still dealing with quite a bit chronic anxiety and depression. Upset/struggling with loss of independence since covid 19 pandemic and her inability to drive. Son and pt wonder about change of venlafaxine.   Lots of spontaneous crying, bursts of sadness.  Has been an issues for years, worse the last 6-12 mo. Spends lots of time by herself and likes it this way but admits sometimes wants to get out but unable to do this much b/c cannot drive herself. She is independent in all ADLs.  Her memory impairment is unchanged.  She occasionally takes a 1/2 xanax prn but most of the time is fearful of the med at the same time.  When she does take it she waits too late. Tolerating statin. No home bp monitoring. She avoids oral NSAIDs.  Her water intake is fair at best.  Has BM 1-2 times per day.  Hard, hurts and she can feel it "tearing" sometimes, sees some BRB at times. Taking metamucil intermittently.  PMP AWARE reviewed today: most recent rx for alprazolam was filled 11/28/19, # 18, rx by me. No red flags.  Past Medical History:  Diagnosis Date  . Anxiety and depression   . Benign carcinoid tumor of duodenum   . Carpal tunnel syndrome on left   . Cervical disc disease    MRI 08/2019->2 levels of mild spinal stenosis w/out any nerve or cord compression, other levels milder sponylotic changes.  . Chronic renal insufficiency, stage 3 (moderate)    GFR 50s  . CVA (cerebral vascular accident) ([place])    pt w/out hx of sx's.  Chronic lacunar infarct on MRI brain 2020.  . Diabetic neuropathy ([place])   . Diverticulosis   . GERD (gastroesophageal  reflux disease)    with HH  . History of uterine fibroid   . History of wrist fracture    LEFT --  09-02-2013/   RESIDUAL DISCOMFORT  . Hyperlipidemia   . Hypertension   . Memory loss    Per Dr. Krista Blue 08/2019->"cerebellar ataxia, memory impairment", MRI brain with generalized atrophy, no reversible causes of memory imp->"Most consistent with central nervous system degenerative disorder", pt was started on aricept and namenda.  . OA (osteoarthritis) of knee    left- Dr Latanya Maudlin  . OSA on CPAP   . Osteoporosis    due for repeat DEXA after 10/2019--->PT DECLINED THIS  . Preventative health care    as of 01/2020 pt declines any further breast ca screening and she's no longer a candidate for cerv ca scr.  She also declines bone density testing.  . Type 2 diabetes mellitus ([place])    with DPN    Past Surgical History:  Procedure Laterality Date  . ABDOMINAL HYSTERECTOMY  1970's   AND APPENDECTOMY  . BREAST REDUCTION SURGERY  1995  . CARDIOVASCULAR STRESS TEST  12-21-2007   NORMAL NUCLEAR STUDY/  NO ISCHEMIA/  EF 76%  . CARPAL TUNNEL RELEASE Left 04/03/2014   Procedure: LEFT CARPAL TUNNEL RELEASE;  Surgeon: Linna Hoff, MD;  Location: [facility];  Service: Orthopedics;  Laterality: Left;  ANESTHESIA: LOCAL WITH IV SEDATION  . COLONOSCOPY  05/15/2018   diverticulosis.  No further screening colonoscopies needed.  . DG  BONE DENSITY ([place] HX)  10/25/2017   Repeat was planned 10/2019 BUT PT DECLINED THIS TEST  . EUS N/A 11/05/2015   Procedure: UPPER ENDOSCOPIC ULTRASOUND (EUS) LINEAR;  Surgeon: Milus Banister, MD;  Location: [facility];  Service: Endoscopy;  Laterality: N/A;  . EXPLORATORY LAPAROTOMY/  LYSIS ADHESIONS/  BILATERAL SALPINGOOPHORECTOMY  1980's  . KNEE ARTHROSCOPY W/ MENISCECTOMY Left 01-03-2006  . NASAL SINUS SURGERY  1990  . REDUCTION MAMMAPLASTY    . TONSILLECTOMY  AGE 40  . TOTAL KNEE ARTHROPLASTY Bilateral RIGHT  10-19-2004/   LEFT  10-31-2007  .  TRANSTHORACIC ECHOCARDIOGRAM  12-21-2007   MILD TO MODERATE LAE/  EF 55%/  MILD AV CALCIFICATION WITH NO STENOSIS    Outpatient Medications Prior to Visit  Medication Sig Dispense Refill  . ALPRAZolam (XANAX) 0.5 MG tablet 1 tab po bid prn anxiety or excessive emotion 60 tablet 1  . aspirin 81 MG tablet Take 81 mg by mouth daily.      Marland Kitchen CALCIUM PO Take 1 tablet by mouth daily.    . diclofenac Sodium (VOLTAREN) 1 % GEL 1 G TO AFFECTED AREA OF EACH WRIST 2 TIMES PER DAY 100 g 1  . losartan-hydrochlorothiazide (HYZAAR) 100-12.5 MG tablet TAKE 1 TABLET BY MOUTH EVERY DAY 90 tablet 3  . memantine (NAMENDA) 10 MG tablet TAKE 1 TABLET BY MOUTH TWICE A DAY 180 tablet 3  . omeprazole (PRILOSEC) 40 MG capsule Take 40 mg by mouth daily.    . pravastatin (PRAVACHOL) 80 MG tablet TAKE 1 TABLET ONCE A DAY EVERY EVENING ORALLY 90 tablet 3  . Venlafaxine HCl 225 MG TB24 TAKE 1 TABLET BY MOUTH EVERY DAY WITH FOOD 90 tablet 3  . amLODipine (NORVASC) 5 MG tablet TAKE 1 TABLET (5 MG TOTAL) BY MOUTH DAILY. 90 tablet 1   No facility-administered medications prior to visit.    Allergies  Allergen Reactions  . Lipitor [Atorvastatin] Other (See Comments)    memory  . Pioglitazone Other (See Comments)    HEADACHE Actos REACTION: headache  . Ace Inhibitors Cough    Lotensin REACTION: cough  . Ambien [Zolpidem Tartrate] Other (See Comments)    unknown  . Bupropion Hcl Other (See Comments)    Wellbutrin REACTION: hallucinations    ROS As per HPI  PE: Blood pressure 116/71, pulse 76, temperature 98.3 F (36.8 C), temperature source Temporal, resp. rate 16, height 5\' 3"  (1.6 m), weight 146 lb 9.6 oz (66.5 kg), SpO2 98 %.s Gen: Alert, well appearing.  Patient is oriented to person, place, and general situation. She turns the topic back to her lack of independence since she can't drive and relies on her son to report most of how she's doing. Anal exam chaperoned by CMA [place]. Peri-anal region  with symmetric area of mildly macerated and pinkish-hued skin with well demarcated borders.  No vesicles, pustules, or nodules.  The area is tender to touch. No hemorrhoids or anal fissures.   LABS:  Lab Results  Component Value Date   TSH 1.36 06/05/2019   Lab Results  Component Value Date   WBC 6.8 01/22/2020   HGB 14.6 01/22/2020   HCT 43.8 01/22/2020   MCV 86.1 01/22/2020   PLT 196.0 01/22/2020   Lab Results  Component Value Date   CREATININE 0.98 07/20/2020   BUN 26 (H)  07/20/2020   NA 143 07/20/2020   K 4.2 07/20/2020   CL 105 07/20/2020   CO2 31 07/20/2020   Lab Results  Component Value Date   ALT 10 01/22/2020   AST 20 01/22/2020   ALKPHOS 75 01/22/2020   BILITOT 0.9 01/22/2020   Lab Results  Component Value Date   CHOL 146 01/22/2020   Lab Results  Component Value Date   HDL 55.60 01/22/2020   Lab Results  Component Value Date   LDLCALC 70 01/22/2020   Lab Results  Component Value Date   TRIG 102.0 01/22/2020   Lab Results  Component Value Date   CHOLHDL 3 01/22/2020   Lab Results  Component Value Date   HGBA1C 5.6 07/20/2020   IMPRESSION AND PLAN:  1) Chronic constipation, with peri-anal irritation/intertrigo-->we'll try lotrisone cream bid + avoid over-wiping and keep area as dry as possible. Continue metamucil and add senakot S two tabs daily.   May add miralax 1 capful qd - bid prn.  2) Anxiety and depression, chronic, with dementia. Discussed the pros and cons of weening off/changing her effexor xr and decided that the risk of adverse effects of trying this outweigh uncertain benefit. Continue effexor xr 225mg  qd at this time.  3) HTN: The current medical regimen is effective;  continue present plan and medications. bMET today.  4) HLD: tolerating statin. Last lipid panel 6 mo ago was 70, hepatic panel normal at that time. Plan rpt lipids 6 mo.  5) CRI IIIa: avoiding oral NSAIDs. Encouraged adequate hydration. BMET today.  6)  Bilat hearing loss: at the end of visit today her son requested an audiology referral for her so I ordered this today.  An After Visit Summary was printed and given to the patient.  FOLLOW UP: Return in about 3 months (around 10/20/2020) for routine chronic illness f/u.  Signed:  Crissie Sickles, MD           07/20/2020

## 2020-07-31 ENCOUNTER — Other Ambulatory Visit: Payer: Self-pay

## 2020-07-31 ENCOUNTER — Ambulatory Visit: Payer: Medicare Other | Admitting: Podiatry

## 2020-07-31 ENCOUNTER — Encounter: Payer: Self-pay | Admitting: Podiatry

## 2020-07-31 VITALS — Temp 98.0°F

## 2020-07-31 DIAGNOSIS — B351 Tinea unguium: Secondary | ICD-10-CM | POA: Diagnosis not present

## 2020-07-31 DIAGNOSIS — E1142 Type 2 diabetes mellitus with diabetic polyneuropathy: Secondary | ICD-10-CM | POA: Diagnosis not present

## 2020-07-31 DIAGNOSIS — M79674 Pain in right toe(s): Secondary | ICD-10-CM | POA: Diagnosis not present

## 2020-07-31 DIAGNOSIS — M79675 Pain in left toe(s): Secondary | ICD-10-CM

## 2020-07-31 DIAGNOSIS — Q828 Other specified congenital malformations of skin: Secondary | ICD-10-CM | POA: Diagnosis not present

## 2020-07-31 NOTE — Patient Instructions (Signed)
Diabetes Mellitus and Foot Care Foot care is an important part of your health, especially when you have diabetes. Diabetes may cause you to have problems because of poor blood flow (circulation) to your feet and legs, which can cause your skin to:  Become thinner and drier.  Break more easily.  Heal more slowly.  Peel and crack. You may also have nerve damage (neuropathy) in your legs and feet, causing decreased feeling in them. This means that you may not notice minor injuries to your feet that could lead to more serious problems. Noticing and addressing any potential problems early is the best way to prevent future foot problems. How to care for your feet Foot hygiene  Wash your feet daily with warm water and mild soap. Do not use hot water. Then, pat your feet and the areas between your toes until they are completely dry. Do not soak your feet as this can dry your skin.  Trim your toenails straight across. Do not dig under them or around the cuticle. File the edges of your nails with an emery board or nail file.  Apply a moisturizing lotion or petroleum jelly to the skin on your feet and to dry, brittle toenails. Use lotion that does not contain alcohol and is unscented. Do not apply lotion between your toes. Shoes and socks  Wear clean socks or stockings every day. Make sure they are not too tight. Do not wear knee-high stockings since they may decrease blood flow to your legs.  Wear shoes that fit properly and have enough cushioning. Always look in your shoes before you put them on to be sure there are no objects inside.  To break in new shoes, wear them for just a few hours a day. This prevents injuries on your feet. Wounds, scrapes, corns, and calluses  Check your feet daily for blisters, cuts, bruises, sores, and redness. If you cannot see the bottom of your feet, use a mirror or ask someone for help.  Do not cut corns or calluses or try to remove them with medicine.  If you  find a minor scrape, cut, or break in the skin on your feet, keep it and the skin around it clean and dry. You may clean these areas with mild soap and water. Do not clean the area with peroxide, alcohol, or iodine.  If you have a wound, scrape, corn, or callus on your foot, look at it several times a day to make sure it is healing and not infected. Check for: ? Redness, swelling, or pain. ? Fluid or blood. ? Warmth. ? Pus or a bad smell. General instructions  Do not cross your legs. This may decrease blood flow to your feet.  Do not use heating pads or hot water bottles on your feet. They may burn your skin. If you have lost feeling in your feet or legs, you may not know this is happening until it is too late.  Protect your feet from hot and cold by wearing shoes, such as at the beach or on hot pavement.  Schedule a complete foot exam at least once a year (annually) or more often if you have foot problems. If you have foot problems, report any cuts, sores, or bruises to your health care provider immediately. Contact a health care provider if:  You have a medical condition that increases your risk of infection and you have any cuts, sores, or bruises on your feet.  You have an injury that is not   healing.  You have redness on your legs or feet.  You feel burning or tingling in your legs or feet.  You have pain or cramps in your legs and feet.  Your legs or feet are numb.  Your feet always feel cold.  You have pain around a toenail. Get help right away if:  You have a wound, scrape, corn, or callus on your foot and: ? You have pain, swelling, or redness that gets worse. ? You have fluid or blood coming from the wound, scrape, corn, or callus. ? Your wound, scrape, corn, or callus feels warm to the touch. ? You have pus or a bad smell coming from the wound, scrape, corn, or callus. ? You have a fever. ? You have a red line going up your leg. Summary  Check your feet every day  for cuts, sores, red spots, swelling, and blisters.  Moisturize feet and legs daily.  Wear shoes that fit properly and have enough cushioning.  If you have foot problems, report any cuts, sores, or bruises to your health care provider immediately.  Schedule a complete foot exam at least once a year (annually) or more often if you have foot problems. This information is not intended to replace advice given to you by your health care provider. Make sure you discuss any questions you have with your health care provider. Document Revised: 08/28/2019 Document Reviewed: 01/06/2017 Elsevier Patient Education  2020 Elsevier Inc.  

## 2020-08-02 NOTE — Progress Notes (Signed)
Subjective: 84 year old female presents the office today for concerns of thick, discolored toenails that she cannot trim her self as well as her painful calluses.  She denies any open sores and denies any swelling or redness.  She has seen numerous other podiatrist for similar issues. Denies any systemic complaints such as fevers, chills, nausea, vomiting. No acute changes since last appointment, and no other complaints at this time.   Objective: NAD DP/PT pulses palpable bilaterally, CRT less than 3 seconds Sensation decreased with Semmes Weinstein monofilament Nails are hypertrophic, dystrophic, brittle, discolored, elongated 10. No surrounding redness or drainage. Tenderness nails 1-5 bilaterally.  Thick hyperkeratotic lesions present in left heel, submetatarsal x2, submetatarsal x2.  Upon debridement there is no ongoing ulceration drainage or signs of infection. No pain with calf compression, swelling, warmth, erythema  Assessment: Symptomatic onychomycosis, painful hyperkeratotic lesions  Plan: -All treatment options discussed with the patient including all alternatives, risks, complications.  -Nails were sharply debrided x10 without any complications or bleeding -Hyperkeratotic lesion sharply debrided x6 without any complications or bleeding -Patient encouraged to call the office with any questions, concerns, change in symptoms.   Trula Slade DPM

## 2020-08-05 ENCOUNTER — Ambulatory Visit: Payer: Medicare Other | Admitting: Audiologist

## 2020-08-12 ENCOUNTER — Other Ambulatory Visit: Payer: Self-pay | Admitting: Family Medicine

## 2020-09-14 ENCOUNTER — Other Ambulatory Visit: Payer: Self-pay | Admitting: Family Medicine

## 2020-09-14 MED ORDER — ALPRAZOLAM 0.5 MG PO TABS: ORAL_TABLET | ORAL | 1 refills | Status: AC

## 2020-09-14 NOTE — Telephone Encounter (Signed)
Requesting: alprazolam Contract:n/a UDS: n/a Last Visit:07/20/20 Next Visit: advised to f/u 3 months Last Refill:11/28/19(60,1)  Please Advise. Medication pending

## 2020-09-14 NOTE — Telephone Encounter (Signed)
  LAST APPOINTMENT DATE: 07/20/2020   NEXT APPOINTMENT DATE:@Visit  date not found  MEDICATION: ALPRAZolam (XANAX) 0.5 MG tablet  PHARMACY:[id number] - [place], [place] - [address]

## 2020-10-02 ENCOUNTER — Other Ambulatory Visit: Payer: Self-pay | Admitting: Family Medicine

## 2020-10-07 ENCOUNTER — Other Ambulatory Visit: Payer: Self-pay | Admitting: Family Medicine

## 2020-10-10 ENCOUNTER — Other Ambulatory Visit: Payer: Self-pay | Admitting: Neurology

## 2020-10-15 ENCOUNTER — Encounter: Payer: Self-pay | Admitting: Family Medicine

## 2020-10-15 ENCOUNTER — Ambulatory Visit (INDEPENDENT_AMBULATORY_CARE_PROVIDER_SITE_OTHER): Payer: Medicare Other | Admitting: Family Medicine

## 2020-10-15 ENCOUNTER — Other Ambulatory Visit: Payer: Self-pay

## 2020-10-15 VITALS — BP 135/68 | HR 68 | Temp 98.0°F | Resp 16 | Ht 63.0 in | Wt 142.8 lb

## 2020-10-15 DIAGNOSIS — R21 Rash and other nonspecific skin eruption: Secondary | ICD-10-CM | POA: Diagnosis not present

## 2020-10-15 DIAGNOSIS — F0390 Unspecified dementia without behavioral disturbance: Secondary | ICD-10-CM | POA: Diagnosis not present

## 2020-10-15 DIAGNOSIS — L304 Erythema intertrigo: Secondary | ICD-10-CM | POA: Diagnosis not present

## 2020-10-15 DIAGNOSIS — K5909 Other constipation: Secondary | ICD-10-CM

## 2020-10-15 NOTE — Progress Notes (Signed)
OFFICE VISIT  10/15/2020  CC:  Chief Complaint  Patient presents with  . Follow-up    rash    HPI:    Patient is a 84 y.o. Caucasian female who presents accompanied by her son for f/u peri-anal rash. A/P as of last visit: "1) Chronic constipation, with peri-anal irritation/intertrigo-->we'll try lotrisone cream bid + avoid over-wiping and keep area as dry as possible. Continue metamucil and add senakot S two tabs daily.   May add miralax 1 capful qd - bid prn.  2) Anxiety and depression, chronic, with dementia. Discussed the pros and cons of weening off/changing her effexor xr and decided that the risk of adverse effects of trying this outweigh uncertain benefit. Continue effexor xr 225mg  qd at this time.  3) HTN: The current medical regimen is effective;  continue present plan and medications. bMET today.  4) HLD: tolerating statin. Last lipid panel 6 mo ago was 70, hepatic panel normal at that time. Plan rpt lipids 6 mo.  5) CRI IIIa: avoiding oral NSAIDs. Encouraged adequate hydration. BMET today.  6) Bilat hearing loss: at the end of visit today her son requested an audiology referral for her so I ordered this today."  INTERIM HX: Here b/c buttocks rash gradually worsening. She has gradually progressive memory loss/dementia and is not regularly taking the constipation med regimen (senakot S and miralax) plus she has only been applying vaseline sometimes to anal rash. Was supposed to have been applying lotrisone cream since last visit 3 mo ago but she is applying this to some itchy small areas of rash on tops of hands (helping) only. The anal area hurts, particularly with large and hard BMs.  Sounds like she does wipe excessively after BMs.  No blood noted around buttock or anal area.  Past Medical History:  Diagnosis Date  . Anxiety and depression   . Benign carcinoid tumor of duodenum   . Carpal tunnel syndrome on left   . Cervical disc disease    MRI  08/2019->2 levels of mild spinal stenosis w/out any nerve or cord compression, other levels milder sponylotic changes.  . Chronic renal insufficiency, stage 3 (moderate) ([facility])    GFR 50s  . CVA (cerebral vascular accident) ([place])    pt w/out hx of sx's.  Chronic lacunar infarct on MRI brain 2020.  . Diabetic neuropathy ([place])   . Diverticulosis   . GERD (gastroesophageal reflux disease)    with HH  . History of uterine fibroid   . History of wrist fracture    LEFT --  09-02-2013/   RESIDUAL DISCOMFORT  . Hyperlipidemia   . Hypertension   . Memory loss    Per Dr. Krista Blue 08/2019->"cerebellar ataxia, memory impairment", MRI brain with generalized atrophy, no reversible causes of memory imp->"Most consistent with central nervous system degenerative disorder", pt was started on aricept and namenda.  . OA (osteoarthritis) of knee    left- Dr Latanya Maudlin  . OSA on CPAP   . Osteoporosis    due for repeat DEXA after 10/2019--->PT DECLINED THIS  . Preventative health care    as of 01/2020 pt declines any further breast ca screening and she's no longer a candidate for cerv ca scr.  She also declines bone density testing.  . Type 2 diabetes mellitus ([place])    with DPN    Past Surgical History:  Procedure Laterality Date  . ABDOMINAL HYSTERECTOMY  1970's   AND APPENDECTOMY  . BREAST REDUCTION SURGERY  1995  . CARDIOVASCULAR  STRESS TEST  12-21-2007   NORMAL NUCLEAR STUDY/  NO ISCHEMIA/  EF 76%  . CARPAL TUNNEL RELEASE Left 04/03/2014   Procedure: LEFT CARPAL TUNNEL RELEASE;  Surgeon: Linna Hoff, MD;  Location: [facility];  Service: Orthopedics;  Laterality: Left;  ANESTHESIA: LOCAL WITH IV SEDATION  . COLONOSCOPY  05/15/2018   diverticulosis.  No further screening colonoscopies needed.  . DG  BONE DENSITY ([place] HX)  10/25/2017   Repeat was planned 10/2019 BUT PT DECLINED THIS TEST  . EUS N/A 11/05/2015   Procedure: UPPER ENDOSCOPIC ULTRASOUND (EUS) LINEAR;  Surgeon: Milus Banister,  MD;  Location: [facility];  Service: Endoscopy;  Laterality: N/A;  . EXPLORATORY LAPAROTOMY/  LYSIS ADHESIONS/  BILATERAL SALPINGOOPHORECTOMY  1980's  . KNEE ARTHROSCOPY W/ MENISCECTOMY Left 01-03-2006  . NASAL SINUS SURGERY  1990  . REDUCTION MAMMAPLASTY    . TONSILLECTOMY  AGE 40  . TOTAL KNEE ARTHROPLASTY Bilateral RIGHT  10-19-2004/   LEFT  10-31-2007  . TRANSTHORACIC ECHOCARDIOGRAM  12-21-2007   MILD TO MODERATE LAE/  EF 55%/  MILD AV CALCIFICATION WITH NO STENOSIS    Outpatient Medications Prior to Visit  Medication Sig Dispense Refill  . ALPRAZolam (XANAX) 0.5 MG tablet 1 tab po bid prn anxiety or excessive emotion 60 tablet 1  . amLODipine (NORVASC) 5 MG tablet TAKE 1 TABLET (5 MG TOTAL) BY MOUTH DAILY. 90 tablet 3  . aspirin 81 MG tablet Take 81 mg by mouth daily.      Marland Kitchen CALCIUM PO Take 1 tablet by mouth daily.    . clotrimazole-betamethasone (LOTRISONE) cream Apply 1 application topically 2 (two) times daily. 30 g 2  . diclofenac Sodium (VOLTAREN) 1 % GEL 1 G TO AFFECTED AREA OF EACH WRIST 2 TIMES PER DAY 100 g 1  . losartan-hydrochlorothiazide (HYZAAR) 100-12.5 MG tablet TAKE 1 TABLET BY MOUTH EVERY DAY 90 tablet 3  . memantine (NAMENDA) 10 MG tablet Take 1 tablet (10 mg total) by mouth 2 (two) times daily. Please call [phone] to schedule follow up appointment. 180 tablet 0  . omeprazole (PRILOSEC) 40 MG capsule Take 40 mg by mouth daily.    . pravastatin (PRAVACHOL) 80 MG tablet TAKE 1 TABLET ONCE A DAY EVERY EVENING ORALLY 90 tablet 0  . Venlafaxine HCl 225 MG TB24 TAKE 1 TABLET BY MOUTH EVERY DAY WITH FOOD 90 tablet 3   No facility-administered medications prior to visit.    Allergies  Allergen Reactions  . Lipitor [Atorvastatin] Other (See Comments)    memory  . Pioglitazone Other (See Comments)    HEADACHE Actos REACTION: headache  . Ace Inhibitors Cough    Lotensin REACTION: cough  . Ambien [Zolpidem Tartrate] Other (See Comments)    unknown  . Bupropion Hcl  Other (See Comments)    Wellbutrin REACTION: hallucinations    ROS As per HPI  PE: Vitals with BMI 10/15/2020 07/20/2020 04/29/2020  Height 5\' 3"  5\' 3"  5\' 3"   Weight 142 lbs 13 oz 146 lbs 10 oz 148 lbs 3 oz  BMI 25.3 14.78 29.56  Systolic 213 086 578  Diastolic 68 71 71  Pulse 68 76 65   Exam chaperoned by Deveron Furlong, CMA.  Gen: Alert, well appearing.  Patient is oriented to person, place, time, and situation. Macerated, hypopigmented, well demarcated rash on inner aspect of both buttocks and covering anus. Two small linear areas of skin fissuring but not in anal sphincter area.  No blood.  LABS:  Chemistry      Component Value Date/Time   NA 143 07/20/2020 1149   NA 140 02/13/2019 0000   K 4.2 07/20/2020 1149   CL 105 07/20/2020 1149   CO2 31 07/20/2020 1149   BUN 26 (H) 07/20/2020 1149   BUN 28 (A) 02/13/2019 0000   CREATININE 0.98 07/20/2020 1149   CREATININE 1.01 (H) 08/23/2019 1516   GLU 106 02/13/2019 0000      Component Value Date/Time   CALCIUM 9.9 07/20/2020 1149   ALKPHOS 75 01/22/2020 0904   AST 20 01/22/2020 0904   ALT 10 01/22/2020 0904   BILITOT 0.9 01/22/2020 0904     Lab Results  Component Value Date   WBC 6.8 01/22/2020   HGB 14.6 01/22/2020   HCT 43.8 01/22/2020   MCV 86.1 01/22/2020   PLT 196.0 01/22/2020   Lab Results  Component Value Date   HGBA1C 5.6 07/20/2020    IMPRESSION AND PLAN:  1) Intertrigo, peni-anal area. She is incompletely treating her chronic constipation and using vaseline in the area but hard to tell how consistently she applies this.  She did not apply the lotrisone I rx'd last visit. I'll have her hold off from the lostrisone at this point. Discussed increased efforts a keeping area clean and dry, avoiding excessive wiping, start application of otc zinc oxide cream or ointment regularly.  Start taking miralax 1 capful bid-tid REGULARLY and senakot S 2 tabs per day REGULARLY. No topical antifungal or steroid  or antibacterial recommended at this time.  2) Feet pain, question of moist/bleeding between toes. Her feet/between toes had no areas of rash or skin breakdown on exam today.  No evidence of recent bleeding or excessive moisture.  Toenails look fine. She has plantar calluses around metatarsal head areas bilat but these are nontender today. She has appt with podiatry soon. No treatments recommended today for feet.  An After Visit Summary was printed and given to the patient.  FOLLOW UP: No follow-ups on file.  Signed:  Crissie Sickles, MD           10/15/2020

## 2020-10-30 ENCOUNTER — Encounter: Payer: Self-pay | Admitting: Family Medicine

## 2020-10-30 ENCOUNTER — Ambulatory Visit: Payer: Medicare Other | Admitting: Family Medicine

## 2020-10-30 ENCOUNTER — Ambulatory Visit: Payer: Medicare Other | Admitting: Podiatry

## 2020-10-30 ENCOUNTER — Other Ambulatory Visit: Payer: Self-pay

## 2020-10-30 VITALS — BP 130/63 | HR 68 | Temp 97.6°F | Resp 16 | Ht 63.0 in | Wt 143.8 lb

## 2020-10-30 DIAGNOSIS — L304 Erythema intertrigo: Secondary | ICD-10-CM

## 2020-10-30 DIAGNOSIS — M79675 Pain in left toe(s): Secondary | ICD-10-CM | POA: Diagnosis not present

## 2020-10-30 DIAGNOSIS — Q828 Other specified congenital malformations of skin: Secondary | ICD-10-CM

## 2020-10-30 DIAGNOSIS — M79674 Pain in right toe(s): Secondary | ICD-10-CM

## 2020-10-30 DIAGNOSIS — E1142 Type 2 diabetes mellitus with diabetic polyneuropathy: Secondary | ICD-10-CM

## 2020-10-30 DIAGNOSIS — E118 Type 2 diabetes mellitus with unspecified complications: Secondary | ICD-10-CM

## 2020-10-30 DIAGNOSIS — B351 Tinea unguium: Secondary | ICD-10-CM | POA: Diagnosis not present

## 2020-10-30 DIAGNOSIS — F419 Anxiety disorder, unspecified: Secondary | ICD-10-CM

## 2020-10-30 DIAGNOSIS — I10 Essential (primary) hypertension: Secondary | ICD-10-CM | POA: Diagnosis not present

## 2020-10-30 DIAGNOSIS — N183 Chronic kidney disease, stage 3 unspecified: Secondary | ICD-10-CM

## 2020-10-30 DIAGNOSIS — K5909 Other constipation: Secondary | ICD-10-CM

## 2020-10-30 DIAGNOSIS — F0390 Unspecified dementia without behavioral disturbance: Secondary | ICD-10-CM

## 2020-10-30 NOTE — Progress Notes (Signed)
OFFICE VISIT  10/30/2020  CC:  Chief Complaint  Patient presents with  . Follow-up    constipation and peri-anal intertrigo    HPI:    Patient is a 84 y.o. Caucasian female with dementia who presents accompanied by her son for 2 wk f/u peri-anal intertrigo and chronic constipation. A/P as of last visit: "1) Intertrigo, peni-anal area. She is incompletely treating her chronic constipation and using vaseline in the area but hard to tell how consistently she applies this.  She did not apply the lotrisone I rx'd last visit. I'll have her hold off from the lostrisone at this point. Discussed increased efforts a keeping area clean and dry, avoiding excessive wiping, start application of otc zinc oxide cream or ointment regularly.  Start taking miralax 1 capful bid-tid REGULARLY and senakot S 2 tabs per day REGULARLY. No topical antifungal or steroid or antibacterial recommended at this time.  2) Feet pain, question of moist/bleeding between toes. Her feet/between toes had no areas of rash or skin breakdown on exam today.  No evidence of recent bleeding or excessive moisture.  Toenails look fine. She has plantar calluses around metatarsal head areas bilat but these are nontender today. She has appt with podiatry soon. No treatments recommended today for feet."  INTERIM HX: Bottom feels better, says rash is better, doesn't need/want me to check it today.  Compliance with stool med regimen is not so good, as per her usual.  Sounds like taking miralax sometimes, senakot s sometimes.  No persistent/severe bouts of constipation since I last saw her.   DM:  No home glucoses.  Eats whatever she wants.    HTN: occ home bp check consistently <130/80.  Compliant with bp meds.  CRI III:  Her fluid intake is improved but she still struggles with this some. She avoids NSAIDs.  Only occ uses alpraz, usually 1/2 tab. Memory impairment about the same, doesn't drive but otherwise is highly  functional. Some crying spells but not having persistent sad/low mood and hopelessness.  ROS: no fevers, no CP, no SOB, no wheezing, no cough, no dizziness, no HAs, no rashes, no melena/hematochezia.  No polyuria or polydipsia.  No myalgias or arthralgias.  No focal weakness, paresthesias, or tremors.  No acute vision or hearing abnormalities. No n/v/d or abd pain.  No palpitations.    Past Medical History:  Diagnosis Date  . Anxiety and depression   . Benign carcinoid tumor of duodenum   . Carpal tunnel syndrome on left   . Cervical disc disease    MRI 08/2019->2 levels of mild spinal stenosis w/out any nerve or cord compression, other levels milder sponylotic changes.  . Chronic renal insufficiency, stage 3 (moderate) (HCC)    GFR 50s  . CVA (cerebral vascular accident) ([place])    pt w/out hx of sx's.  Chronic lacunar infarct on MRI brain 2020.  . Diabetic neuropathy ([place])   . Diverticulosis   . GERD (gastroesophageal reflux disease)    with HH  . History of uterine fibroid   . History of wrist fracture    LEFT --  09-02-2013/   RESIDUAL DISCOMFORT  . Hyperlipidemia   . Hypertension   . Memory loss    Per Dr. Krista Blue 08/2019->"cerebellar ataxia, memory impairment", MRI brain with generalized atrophy, no reversible causes of memory imp->"Most consistent with central nervous system degenerative disorder", pt was started on aricept and namenda.  . OA (osteoarthritis) of knee    left- Dr Latanya Maudlin  . OSA  on CPAP   . Osteoporosis    due for repeat DEXA after 10/2019--->PT DECLINED THIS  . Preventative health care    as of 01/2020 pt declines any further breast ca screening and she's no longer a candidate for cerv ca scr.  She also declines bone density testing.  . Type 2 diabetes mellitus ([place])    with DPN    Past Surgical History:  Procedure Laterality Date  . ABDOMINAL HYSTERECTOMY  1970's   AND APPENDECTOMY  . BREAST REDUCTION SURGERY  1995  . CARDIOVASCULAR STRESS TEST  12-21-2007    NORMAL NUCLEAR STUDY/  NO ISCHEMIA/  EF 76%  . CARPAL TUNNEL RELEASE Left 04/03/2014   Procedure: LEFT CARPAL TUNNEL RELEASE;  Surgeon: Linna Hoff, MD;  Location: [facility];  Service: Orthopedics;  Laterality: Left;  ANESTHESIA: LOCAL WITH IV SEDATION  . COLONOSCOPY  05/15/2018   diverticulosis.  No further screening colonoscopies needed.  . DG  BONE DENSITY ([place] [facility]X)  10/25/2017   Repeat was planned 10/2019 BUT PT DECLINED THIS TEST  . EUS N/A 11/05/2015   Procedure: UPPER ENDOSCOPIC ULTRASOUND (EUS) LINEAR;  Surgeon: Milus Banister, MD;  Location: [facility];  Service: Endoscopy;  Laterality: N/A;  . EXPLORATORY LAPAROTOMY/  LYSIS ADHESIONS/  BILATERAL SALPINGOOPHORECTOMY  1980's  . KNEE ARTHROSCOPY W/ MENISCECTOMY Left 01-03-2006  . NASAL SINUS SURGERY  1990  . REDUCTION MAMMAPLASTY    . TONSILLECTOMY  AGE 48  . TOTAL KNEE ARTHROPLASTY Bilateral RIGHT  10-19-2004/   LEFT  10-31-2007  . TRANSTHORACIC ECHOCARDIOGRAM  12-21-2007   MILD TO MODERATE LAE/  EF 55%/  MILD AV CALCIFICATION WITH NO STENOSIS    Outpatient Medications Prior to Visit  Medication Sig Dispense Refill  . ALPRAZolam (XANAX) 0.5 MG tablet 1 tab po bid prn anxiety or excessive emotion 60 tablet 1  . amLODipine (NORVASC) 5 MG tablet TAKE 1 TABLET (5 MG TOTAL) BY MOUTH DAILY. 90 tablet 3  . aspirin 81 MG tablet Take 81 mg by mouth daily.      Marland Kitchen CALCIUM PO Take 1 tablet by mouth daily.    . clotrimazole-betamethasone (LOTRISONE) cream Apply 1 application topically 2 (two) times daily. 30 g 2  . diclofenac Sodium (VOLTAREN) 1 % GEL 1 G TO AFFECTED AREA OF EACH WRIST 2 TIMES PER DAY 100 g 1  . losartan-hydrochlorothiazide (HYZAAR) 100-12.5 MG tablet TAKE 1 TABLET BY MOUTH EVERY DAY 90 tablet 3  . memantine (NAMENDA) 10 MG tablet Take 1 tablet (10 mg total) by mouth 2 (two) times daily. Please call [phone] to schedule follow up appointment. 180 tablet 0  . omeprazole (PRILOSEC) 40 MG capsule Take 40  mg by mouth daily.    . pravastatin (PRAVACHOL) 80 MG tablet TAKE 1 TABLET ONCE A DAY EVERY EVENING ORALLY 90 tablet 0  . Venlafaxine HCl 225 MG TB24 TAKE 1 TABLET BY MOUTH EVERY DAY WITH FOOD 90 tablet 3   No facility-administered medications prior to visit.    Allergies  Allergen Reactions  . Lipitor [Atorvastatin] Other (See Comments)    memory  . Pioglitazone Other (See Comments)    HEADACHE Actos REACTION: headache  . Ace Inhibitors Cough    Lotensin REACTION: cough  . Ambien [Zolpidem Tartrate] Other (See Comments)    unknown  . Bupropion Hcl Other (See Comments)    Wellbutrin REACTION: hallucinations    ROS As per HPI  PE: Vitals with BMI 10/30/2020 10/15/2020 07/20/2020  Height 5\' 3"   5\' 3"  5\' 3"   Weight 143 lbs 13 oz 142 lbs 13 oz 146 lbs 10 oz  BMI 25.48 56.3 89.37  Systolic 342 876 811  Diastolic 63 68 71  Pulse 68 68 76   Gen: Alert, well appearing.  Patient is oriented to person, place, time, and situation. AFFECT: pleasant, lucid thought and speech. No further exam today.  LABS:    Chemistry      Component Value Date/Time   NA 143 07/20/2020 1149   NA 140 02/13/2019 0000   K 4.2 07/20/2020 1149   CL 105 07/20/2020 1149   CO2 31 07/20/2020 1149   BUN 26 (H) 07/20/2020 1149   BUN 28 (A) 02/13/2019 0000   CREATININE 0.98 07/20/2020 1149   CREATININE 1.01 (H) 08/23/2019 1516   GLU 106 02/13/2019 0000      Component Value Date/Time   CALCIUM 9.9 07/20/2020 1149   ALKPHOS 75 01/22/2020 0904   AST 20 01/22/2020 0904   ALT 10 01/22/2020 0904   BILITOT 0.9 01/22/2020 0904     Lab Results  Component Value Date   HGBA1C 5.6 07/20/2020   Lab Results  Component Value Date   WBC 6.8 01/22/2020   HGB 14.6 01/22/2020   HCT 43.8 01/22/2020   MCV 86.1 01/22/2020   PLT 196.0 01/22/2020   Lab Results  Component Value Date   CHOL 146 01/22/2020   HDL 55.60 01/22/2020   LDLCALC 70 01/22/2020   TRIG 102.0 01/22/2020   CHOLHDL 3 01/22/2020    IMPRESSION AND PLAN:  1) Peri-anal rash/intertrigo: resolving per pt report.  2) Chronic constipation: pretty stable, pt and son are trying to work on her compliance with senakot and miralax.  3) DM 2: diet-controlled, although her diet is not good. Non-fasting gluc and Hba1c monitoring today.  4) HTN: well controlled. Cont amlod 5 and losart/hct 100/12.5 qd.  5) CRI III: avoids NSAIDs and tries to hydrate well. Lytes/cr today.  6) Dementia with periodic anxiety and depressed mood.  Stable. Cont namenda, venlafaxine, and prn alpraz.  An After Visit Summary was printed and given to the patient.  FOLLOW UP: Return in about 3 months (around 01/30/2021) for routine chronic illness f/u.  Signed:  Crissie Sickles, MD           10/30/2020

## 2020-10-30 NOTE — Progress Notes (Signed)
Subjective: 84 year old female presents the office today for concerns of thick, discolored toenails that she cannot trim her self as well as her painful calluses.  She denies any open sores and denies any swelling or redness. Denies any systemic complaints such as fevers, chills, nausea, vomiting. No acute changes since last appointment, and no other complaints at this time.   Objective: NAD DP/PT pulses palpable bilaterally, CRT less than 3 seconds Sensation decreased with Semmes Weinstein monofilament Nails are hypertrophic, dystrophic, brittle, discolored, elongated 10. No surrounding redness or drainage. Tenderness nails 1-5 bilaterally.  Thick hyperkeratotic lesions present in left heel, submetatarsal x2, submetatarsal x2.  Upon debridement there is no ongoing ulceration drainage or signs of infection. No pain with calf compression, swelling, warmth, erythema  Assessment: Symptomatic onychomycosis, painful hyperkeratotic lesions  Plan: -All treatment options discussed with the patient including all alternatives, risks, complications.  -Nails were sharply debrided x10 without any complications or bleeding -Hyperkeratotic lesion sharply debrided x6 without any complications or bleeding -Patient encouraged to call the office with any questions, concerns, change in symptoms.   Trula Slade DPM

## 2020-10-30 NOTE — Addendum Note (Signed)
Addended by: Octaviano Glow on: 10/30/2020 03:06 PM   Modules accepted: Orders

## 2020-10-31 LAB — BASIC METABOLIC PANEL
BUN/Creatinine Ratio: 21 (calc) (ref 6–22)
BUN: 23 mg/dL (ref 7–25)
CO2: 29 mmol/L (ref 20–32)
Calcium: 10.1 mg/dL (ref 8.6–10.4)
Chloride: 103 mmol/L (ref 98–110)
Creat: 1.09 mg/dL — ABNORMAL HIGH (ref 0.60–0.88)
Glucose, Bld: 100 mg/dL — ABNORMAL HIGH (ref 65–99)
Potassium: 4.1 mmol/L (ref 3.5–5.3)
Sodium: 142 mmol/L (ref 135–146)

## 2020-10-31 LAB — HEMOGLOBIN A1C
Hgb A1c MFr Bld: 5.6 % of total Hgb (ref <5.7)
Mean Plasma Glucose: 114 (calc)
eAG (mmol/L): 6.3 (calc)

## 2020-11-26 ENCOUNTER — Telehealth: Payer: Self-pay | Admitting: Family Medicine

## 2020-11-26 NOTE — Telephone Encounter (Signed)
Spoke with patient's son Lissa Hoard, he noted that Wednesday was not a good day for AWV and will schedule it with provider in future.

## 2020-12-05 ENCOUNTER — Other Ambulatory Visit: Payer: Self-pay | Admitting: Family Medicine

## 2020-12-18 ENCOUNTER — Other Ambulatory Visit: Payer: Self-pay | Admitting: Family Medicine

## 2021-01-05 ENCOUNTER — Encounter: Payer: Self-pay | Admitting: Neurology

## 2021-01-05 ENCOUNTER — Ambulatory Visit: Payer: Medicare Other | Admitting: Neurology

## 2021-01-05 ENCOUNTER — Telehealth: Payer: Self-pay | Admitting: *Deleted

## 2021-01-05 NOTE — Telephone Encounter (Signed)
No showed follow up appointment. 

## 2021-01-06 ENCOUNTER — Other Ambulatory Visit: Payer: Self-pay | Admitting: Neurology

## 2021-01-15 ENCOUNTER — Other Ambulatory Visit: Payer: Self-pay | Admitting: Family Medicine

## 2021-01-29 ENCOUNTER — Ambulatory Visit: Payer: Medicare Other | Admitting: Podiatry

## 2021-02-07 ENCOUNTER — Other Ambulatory Visit: Payer: Self-pay | Admitting: Family Medicine

## 2021-02-11 ENCOUNTER — Other Ambulatory Visit: Payer: Self-pay | Admitting: Neurology

## 2021-02-11 NOTE — Telephone Encounter (Signed)
Memantine refilled x 2 months with note to pharmacy "refill until seen March".

## 2021-02-12 ENCOUNTER — Ambulatory Visit: Payer: Medicare Other | Admitting: Podiatry

## 2021-02-12 ENCOUNTER — Other Ambulatory Visit: Payer: Self-pay

## 2021-02-12 DIAGNOSIS — E1142 Type 2 diabetes mellitus with diabetic polyneuropathy: Secondary | ICD-10-CM

## 2021-02-12 DIAGNOSIS — M79675 Pain in left toe(s): Secondary | ICD-10-CM | POA: Diagnosis not present

## 2021-02-12 DIAGNOSIS — B351 Tinea unguium: Secondary | ICD-10-CM

## 2021-02-12 DIAGNOSIS — Q828 Other specified congenital malformations of skin: Secondary | ICD-10-CM | POA: Diagnosis not present

## 2021-02-12 DIAGNOSIS — M79674 Pain in right toe(s): Secondary | ICD-10-CM

## 2021-02-12 NOTE — Progress Notes (Signed)
Subjective: 85 year old female presents the office today for concerns of thick, discolored toenails that she cannot trim her self as well as her painful calluses. Denies any systemic complaints such as fevers, chills, nausea, vomiting. No acute changes since last appointment, and no other complaints at this time.   A1c 10/30/2020 was 5.6  Objective: NAD DP/PT pulses palpable bilaterally, CRT less than 3 seconds Sensation decreased with Semmes Weinstein monofilament Nails are hypertrophic, dystrophic, brittle, discolored, elongated 10. No surrounding redness or drainage. Tenderness nails 1-5 bilaterally.  Thick hyperkeratotic lesions present in left heel, submetatarsal x2, submetatarsal x2.  Upon debridement there is no ongoing ulceration drainage or signs of infection. No pain with calf compression, swelling, warmth, erythema  Assessment: Symptomatic onychomycosis, painful hyperkeratotic lesions  Plan: -All treatment options discussed with the patient including all alternatives, risks, complications.  -Nails were sharply debrided x10 without any complications or bleeding -Hyperkeratotic lesion sharply debrided x6 without any complications or bleeding -Patient encouraged to call the office with any questions, concerns, change in symptoms.   Trula Slade DPM

## 2021-02-15 ENCOUNTER — Encounter (HOSPITAL_COMMUNITY): Payer: Self-pay

## 2021-02-15 ENCOUNTER — Emergency Department (HOSPITAL_COMMUNITY): Payer: Medicare Other

## 2021-02-15 ENCOUNTER — Emergency Department (HOSPITAL_COMMUNITY)
Admission: EM | Admit: 2021-02-15 | Discharge: 2021-02-16 | Disposition: A | Discharge status: Home / Self Care | Payer: Medicare Other | Attending: Emergency Medicine | Admitting: Emergency Medicine

## 2021-02-15 DIAGNOSIS — N3000 Acute cystitis without hematuria: Secondary | ICD-10-CM | POA: Insufficient documentation

## 2021-02-15 DIAGNOSIS — I129 Hypertensive chronic kidney disease with stage 1 through stage 4 chronic kidney disease, or unspecified chronic kidney disease: Secondary | ICD-10-CM | POA: Insufficient documentation

## 2021-02-15 DIAGNOSIS — F0391 Unspecified dementia with behavioral disturbance: Secondary | ICD-10-CM

## 2021-02-15 DIAGNOSIS — E1140 Type 2 diabetes mellitus with diabetic neuropathy, unspecified: Secondary | ICD-10-CM | POA: Diagnosis not present

## 2021-02-15 DIAGNOSIS — E1122 Type 2 diabetes mellitus with diabetic chronic kidney disease: Secondary | ICD-10-CM | POA: Diagnosis not present

## 2021-02-15 DIAGNOSIS — N183 Chronic kidney disease, stage 3 unspecified: Secondary | ICD-10-CM | POA: Insufficient documentation

## 2021-02-15 DIAGNOSIS — Z7982 Long term (current) use of aspirin: Secondary | ICD-10-CM | POA: Diagnosis not present

## 2021-02-15 DIAGNOSIS — Z96653 Presence of artificial knee joint, bilateral: Secondary | ICD-10-CM | POA: Diagnosis not present

## 2021-02-15 DIAGNOSIS — Z79899 Other long term (current) drug therapy: Secondary | ICD-10-CM | POA: Diagnosis not present

## 2021-02-15 DIAGNOSIS — R4182 Altered mental status, unspecified: Secondary | ICD-10-CM | POA: Diagnosis present

## 2021-02-15 HISTORY — DX: Unspecified dementia, unspecified severity, without behavioral disturbance, psychotic disturbance, mood disturbance, and anxiety: F03.90

## 2021-02-15 LAB — CBC WITH DIFFERENTIAL/PLATELET
Abs Immature Granulocytes: 0.03 10*3/uL (ref 0.00–0.07)
Basophils Absolute: 0 10*3/uL (ref 0.0–0.1)
Basophils Relative: 1 %
Eosinophils Absolute: 0.1 10*3/uL (ref 0.0–0.5)
Eosinophils Relative: 1 %
HCT: 42.4 % (ref 36.0–46.0)
Hemoglobin: 13.9 g/dL (ref 12.0–15.0)
Immature Granulocytes: 0 %
Lymphocytes Relative: 15 %
Lymphs Abs: 1.1 10*3/uL (ref 0.7–4.0)
MCH: 29.3 pg (ref 26.0–34.0)
MCHC: 32.8 g/dL (ref 30.0–36.0)
MCV: 89.5 fL (ref 80.0–100.0)
Monocytes Absolute: 0.6 10*3/uL (ref 0.1–1.0)
Monocytes Relative: 9 %
Neutro Abs: 5.2 10*3/uL (ref 1.7–7.7)
Neutrophils Relative %: 74 %
Platelets: 174 10*3/uL (ref 150–400)
RBC: 4.74 MIL/uL (ref 3.87–5.11)
RDW: 13.1 % (ref 11.5–15.5)
WBC: 7 10*3/uL (ref 4.0–10.5)
nRBC: 0 % (ref 0.0–0.2)

## 2021-02-15 LAB — URINALYSIS, ROUTINE W REFLEX MICROSCOPIC
Bilirubin Urine: NEGATIVE
Glucose, UA: NEGATIVE mg/dL
Ketones, ur: 20 mg/dL — AB
Nitrite: POSITIVE — AB
Protein, ur: NEGATIVE mg/dL
Specific Gravity, Urine: 1.015 (ref 1.005–1.030)
pH: 5 (ref 5.0–8.0)

## 2021-02-15 LAB — COMPREHENSIVE METABOLIC PANEL
ALT: 12 U/L (ref 0–44)
AST: 23 U/L (ref 15–41)
Albumin: 4.1 g/dL (ref 3.5–5.0)
Alkaline Phosphatase: 64 U/L (ref 38–126)
Anion gap: 12 (ref 5–15)
BUN: 29 mg/dL — ABNORMAL HIGH (ref 8–23)
CO2: 23 mmol/L (ref 22–32)
Calcium: 9.6 mg/dL (ref 8.9–10.3)
Chloride: 106 mmol/L (ref 98–111)
Creatinine, Ser: 1.21 mg/dL — ABNORMAL HIGH (ref 0.44–1.00)
GFR, Estimated: 45 mL/min — ABNORMAL LOW (ref >60)
Glucose, Bld: 128 mg/dL — ABNORMAL HIGH (ref 70–99)
Potassium: 3.1 mmol/L — ABNORMAL LOW (ref 3.5–5.1)
Sodium: 141 mmol/L (ref 135–145)
Total Bilirubin: 1.2 mg/dL (ref 0.3–1.2)
Total Protein: 6.8 g/dL (ref 6.5–8.1)

## 2021-02-15 LAB — CBG MONITORING, ED: Glucose-Capillary: 126 mg/dL — ABNORMAL HIGH (ref 70–99)

## 2021-02-15 MED ORDER — CEPHALEXIN 250 MG PO CAPS
500.0000 mg | ORAL_CAPSULE | Freq: Four times a day (QID) | ORAL | Status: DC
  Administered 2021-02-15 – 2021-02-16 (×5): 500 mg via ORAL
  Filled 2021-02-15 (×5): qty 2

## 2021-02-15 MED ORDER — OLANZAPINE 5 MG PO TBDP
5.0000 mg | ORAL_TABLET | Freq: Every day | ORAL | Status: DC
Start: 2021-02-15 — End: 2021-02-16
  Administered 2021-02-15: 5 mg via ORAL
  Filled 2021-02-15 (×3): qty 1

## 2021-02-15 NOTE — NC FL2 (Signed)
[place] LEVEL OF CARE SCREENING TOOL     IDENTIFICATION  Patient Name: Christine Washington Birthdate: January 12, 1936 Sex: female Admission Date (Current Location): 02/15/2021  [facility] and [place] Number:  Herbalist and Address:  [facility], [place] [address] [place], [place] [address]      Provider Number: [id number]  Attending Physician Name and Address:  Deno Etienne, DO  Relative Name and Phone Number:  Jaye, Saal   [phone]    Current Level of Care: Hospital Recommended Level of Care: Skilled Nursing [facility] Prior Approval Number:    Date Approved/Denied:   PASRR Number: [id number]  Discharge Plan: SNF    Current Diagnoses: Patient Active Problem List   Diagnosis Date Noted  . Memory loss 08/27/2019  . Gait abnormality 08/27/2019  . Major neurocognitive disorder due to another medical condition without behavioral disturbance ([place]) 11/01/2018  . Encounter for screening colonoscopy 09/02/2015  . Abdominal pain, chronic, epigastric 09/02/2015  . Bloating 09/02/2015  . Metatarsalgia of right foot 07/15/2015  . Spondylosis of lumbar region without myelopathy or radiculopathy 05/25/2015  . Combined forms of age-related cataract of left eye 02/19/2015  . Cataract 09/05/2013  . Delayed sleep phase syndrome 11/09/2012  . Osteopenia 09/27/2012  . OSA (obstructive sleep apnea) 04/03/2012  . Diabetic neuropathy ([place]) 12/28/2011  . FATIGUE 04/12/2010  . Diabetes type 2, controlled ([place]) 06/02/2009  . SCIATICA 04/22/2009  . GASTRIC POLYP 03/18/2008  . Hyperlipidemia 01/17/2008  . THYROID NODULE 11/22/2007  . GERD 09/24/2007  . History of total knee arthroplasty 09/24/2007  . [place] DISEASE, CERVICAL 09/24/2007  . Major depression, chronic 07/16/2007  . COMMON MIGRAINE 07/11/2007  . HYPERTENSION, BENIGN ESSENTIAL 07/05/2007    Orientation RESPIRATION BLADDER Height & Weight     Self  Normal Continent  Weight:   Height:     BEHAVIORAL SYMPTOMS/MOOD NEUROLOGICAL BOWEL NUTRITION STATUS      Continent Diet (Regular)  AMBULATORY STATUS COMMUNICATION OF NEEDS Skin   Limited Assist Verbally Normal                       Personal Care Assistance Level of Assistance  Bathing,Feeding,Dressing Bathing Assistance: Limited assistance Feeding assistance: Independent Dressing Assistance: Limited assistance     Functional Limitations Info  Sight,Hearing,Speech Sight Info: Adequate Hearing Info: Impaired Speech Info: Adequate    SPECIAL CARE FACTORS FREQUENCY  PT (By licensed PT),OT (By licensed OT)     PT Frequency: 5x weekly OT Frequency: 5x weekly            Contractures Contractures Info: Not present    Additional Factors Info  Allergies   Allergies Info: (5)Bupropion Hcl,Pioglitazone,Ace Inhibitors,Ambien,Lipitor           Current Medications (02/15/2021):  This is the current hospital active medication list Current Facility-Administered Medications  Medication Dose Route Frequency Provider Last Rate Last Admin  . cephALEXin (KEFLEX) capsule 500 mg  500 mg Oral Q6H Davonna Belling, MD   500 mg at 02/15/21 1401   Current Outpatient Medications  Medication Sig Dispense Refill  . ALPRAZolam (XANAX) 0.5 MG tablet 1 tab po bid prn anxiety or excessive emotion 60 tablet 1  . amLODipine (NORVASC) 5 MG tablet TAKE 1 TABLET (5 MG TOTAL) BY MOUTH DAILY. 90 tablet 3  . aspirin 81 MG tablet Take 81 mg by mouth daily.      Marland Kitchen CALCIUM PO Take 1 tablet by mouth daily.    Marland Kitchen  clotrimazole-betamethasone (LOTRISONE) cream Apply 1 application topically 2 (two) times daily. 30 g 2  . diclofenac Sodium (VOLTAREN) 1 % GEL 1 G TO AFFECTED AREA OF EACH WRIST 2 TIMES PER DAY 100 g 1  . losartan-hydrochlorothiazide (HYZAAR) 100-12.5 MG tablet TAKE 1 TABLET BY MOUTH EVERY DAY 90 tablet 3  . memantine (NAMENDA) 10 MG tablet TAKE 1 TABLET BY MOUTH 2 (TWO) TIMES DAILY. PLEASE CALL [phone] TO  SCHEDULE FOLLOW UP APPOINTMENT. 120 tablet 0  . omeprazole (PRILOSEC) 40 MG capsule Take 40 mg by mouth daily.    . pravastatin (PRAVACHOL) 80 MG tablet TAKE 1 TABLET ONCE A DAY EVERY EVENING ORALLY 90 tablet 1  . Venlafaxine HCl 225 MG TB24 TAKE 1 TABLET BY MOUTH EVERY DAY WITH FOOD 90 tablet 1     Discharge Medications: Please see discharge summary for a list of discharge medications.  Relevant Imaging Results:  Relevant Lab Results:   Additional Information SSN# [id number]/ Pt has had Covid vaccines (Moderna)  Sheilagh  Harrington, LCSW

## 2021-02-15 NOTE — ED Notes (Signed)
Pt ambulatory to restroom to give urine sample. Steady on feet with assist of one.

## 2021-02-15 NOTE — ED Provider Notes (Signed)
[facility] EMERGENCY DEPARTMENT Provider Note   CSN: [id number] Arrival date & time: 02/15/21  2229     History Chief Complaint  Patient presents with  . Altered Mental Status    Christine Washington is a 85 y.o. female. Level 5 caveat due to dementia. HPI Patient brought in for mental status change.  States she feels bad.  States she woke up and was shaky and off balance.  States she went outside because she was feeling bad.  EMS was called.  Has a baseline mild dementia.  Patient states she has dementia and has some difficulty speaking.  No pain.  No fevers.  States she began to feel little off last night.  No nausea or vomiting or diarrhea.  Patient states she lives alone.    Past Medical History:  Diagnosis Date  . Anxiety and depression   . Benign carcinoid tumor of duodenum   . Carpal tunnel syndrome on left   . Cervical disc disease    MRI 08/2019->2 levels of mild spinal stenosis w/out any nerve or cord compression, other levels milder sponylotic changes.  . Chronic renal insufficiency, stage 3 (moderate) (HCC)    GFR 50s  . CVA (cerebral vascular accident) ([place])    pt w/out hx of sx's.  Chronic lacunar infarct on MRI brain 2020.  . Diabetic neuropathy ([place])   . Diverticulosis   . GERD (gastroesophageal reflux disease)    with HH  . History of uterine fibroid   . History of wrist fracture    LEFT --  09-02-2013/   RESIDUAL DISCOMFORT  . Hyperlipidemia   . Hypertension   . Memory loss    Per Dr. Krista Blue 08/2019->"cerebellar ataxia, memory impairment", MRI brain with generalized atrophy, no reversible causes of memory imp->"Most consistent with central nervous system degenerative disorder", pt was started on aricept and namenda.  . OA (osteoarthritis) of knee    left- Dr Latanya Maudlin  . OSA on CPAP   . Osteoporosis    due for repeat DEXA after 10/2019--->PT DECLINED THIS  . Preventative health care    as of 01/2020 pt declines any further breast ca screening  and she's no longer a candidate for cerv ca scr.  She also declines bone density testing.  . Type 2 diabetes mellitus ([place])    with DPN    Patient Active Problem List   Diagnosis Date Noted  . Memory loss 08/27/2019  . Gait abnormality 08/27/2019  . Major neurocognitive disorder due to another medical condition without behavioral disturbance ([place]) 11/01/2018  . Encounter for screening colonoscopy 09/02/2015  . Abdominal pain, chronic, epigastric 09/02/2015  . Bloating 09/02/2015  . Metatarsalgia of right foot 07/15/2015  . Spondylosis of lumbar region without myelopathy or radiculopathy 05/25/2015  . Combined forms of age-related cataract of left eye 02/19/2015  . Cataract 09/05/2013  . Delayed sleep phase syndrome 11/09/2012  . Osteopenia 09/27/2012  . OSA (obstructive sleep apnea) 04/03/2012  . Diabetic neuropathy ([place]) 12/28/2011  . FATIGUE 04/12/2010  . Diabetes type 2, controlled ([place]) 06/02/2009  . SCIATICA 04/22/2009  . GASTRIC POLYP 03/18/2008  . Hyperlipidemia 01/17/2008  . THYROID NODULE 11/22/2007  . GERD 09/24/2007  . History of total knee arthroplasty 09/24/2007  . [place] DISEASE, CERVICAL 09/24/2007  . Major depression, chronic 07/16/2007  . COMMON MIGRAINE 07/11/2007  . HYPERTENSION, BENIGN ESSENTIAL 07/05/2007    Past Surgical History:  Procedure Laterality Date  . ABDOMINAL HYSTERECTOMY  1970's   AND APPENDECTOMY  .  BREAST REDUCTION SURGERY  1995  . CARDIOVASCULAR STRESS TEST  12-21-2007   NORMAL NUCLEAR STUDY/  NO ISCHEMIA/  EF 76%  . CARPAL TUNNEL RELEASE Left 04/03/2014   Procedure: LEFT CARPAL TUNNEL RELEASE;  Surgeon: Linna Hoff, MD;  Location: [facility];  Service: Orthopedics;  Laterality: Left;  ANESTHESIA: LOCAL WITH IV SEDATION  . COLONOSCOPY  05/15/2018   diverticulosis.  No further screening colonoscopies needed.  . DG  BONE DENSITY ([place] HX)  10/25/2017   Repeat was planned 10/2019 BUT PT DECLINED THIS TEST  . EUS N/A  11/05/2015   Procedure: UPPER ENDOSCOPIC ULTRASOUND (EUS) LINEAR;  Surgeon: Milus Banister, MD;  Location: [facility];  Service: Endoscopy;  Laterality: N/A;  . EXPLORATORY LAPAROTOMY/  LYSIS ADHESIONS/  BILATERAL SALPINGOOPHORECTOMY  1980's  . KNEE ARTHROSCOPY W/ MENISCECTOMY Left 01-03-2006  . NASAL SINUS SURGERY  1990  . REDUCTION MAMMAPLASTY    . TONSILLECTOMY  AGE 48  . TOTAL KNEE ARTHROPLASTY Bilateral RIGHT  10-19-2004/   LEFT  10-31-2007  . TRANSTHORACIC ECHOCARDIOGRAM  12-21-2007   MILD TO MODERATE LAE/  EF 55%/  MILD AV CALCIFICATION WITH NO STENOSIS     OB History   No obstetric history on file.     Family History  Problem Relation Age of Onset  . Hypertension Mother   . Stroke Mother   . Diabetes Mother   . Stroke Father 79  . Hypertension Father   . Cancer Father        melanoma  . Multiple sclerosis Sister        died  . COPD Sister   . Colon cancer Neg Hx     Social History   Tobacco Use  . Smoking status: Never Smoker  . Smokeless tobacco: Never Used  Vaping Use  . Vaping Use: Never used  Substance Use Topics  . Alcohol use: No  . Drug use: No    Home Medications Prior to Admission medications   Medication Sig Start Date End Date Taking? Authorizing Provider  ALPRAZolam Duanne Moron) 0.5 MG tablet 1 tab po bid prn anxiety or excessive emotion 09/14/20   McGowen, Adrian Blackwater, MD  amLODipine (NORVASC) 5 MG tablet TAKE 1 TABLET (5 MG TOTAL) BY MOUTH DAILY. 07/20/20   McGowen, Adrian Blackwater, MD  aspirin 81 MG tablet Take 81 mg by mouth daily.      [provider]  CALCIUM PO Take 1 tablet by mouth daily.    [provider]  clotrimazole-betamethasone (LOTRISONE) cream Apply 1 application topically 2 (two) times daily. 07/20/20 07/20/21  McGowenAdrian Blackwater, MD  diclofenac Sodium (VOLTAREN) 1 % GEL 1 G TO AFFECTED AREA OF EACH WRIST 2 TIMES PER DAY 12/07/20   McGowen, Adrian Blackwater, MD  losartan-hydrochlorothiazide (HYZAAR) 100-12.5 MG tablet TAKE 1 TABLET BY  MOUTH EVERY DAY 04/21/20   McGowen, Adrian Blackwater, MD  memantine (NAMENDA) 10 MG tablet TAKE 1 TABLET BY MOUTH 2 (TWO) TIMES DAILY. PLEASE CALL [phone] TO SCHEDULE FOLLOW UP APPOINTMENT. 02/11/21   Marcial Pacas, MD  omeprazole (PRILOSEC) 40 MG capsule Take 40 mg by mouth daily. 01/04/19   [provider]  pravastatin (PRAVACHOL) 80 MG tablet TAKE 1 TABLET ONCE A DAY EVERY EVENING ORALLY 12/21/20   McGowen, Adrian Blackwater, MD  Venlafaxine HCl 225 MG TB24 TAKE 1 TABLET BY MOUTH EVERY DAY WITH FOOD 01/17/21   McGowen, Adrian Blackwater, MD    Allergies    Lipitor [atorvastatin], Pioglitazone, Ace inhibitors, Ambien [  zolpidem tartrate], and Bupropion hcl  Review of Systems   Review of Systems  Unable to perform ROS: Dementia    Physical Exam Updated Vital Signs BP (!) 147/67   Pulse 82   Temp 98.4 F (36.9 C) (Oral)   Resp 19   SpO2 98%   Physical Exam Vitals and nursing note reviewed.  Constitutional:      Appearance: Normal appearance.  HENT:     Head: Atraumatic.  Eyes:     Comments: Mild nystagmus with gaze to left and right.  Cardiovascular:     Rate and Rhythm: Normal rate and regular rhythm.  Pulmonary:     Breath sounds: No wheezing or rhonchi.  Abdominal:     Tenderness: There is no abdominal tenderness.  Musculoskeletal:        General: No tenderness.     Cervical back: Neck supple.  Skin:    General: Skin is warm.     Capillary Refill: Capillary refill takes less than 2 seconds.  Neurological:     Mental Status: She is alert.     Comments: Awake and pleasant but some confusion.  Moves all extremities.  Somewhat difficult exam due to patient beginning to cry during the history.  Good grip strength bilaterally.  Psychiatric:     Comments:   Patient is tearful.     ED Results / Procedures / Treatments   Labs (all labs ordered are listed, but only abnormal results are displayed) Labs Reviewed  COMPREHENSIVE METABOLIC PANEL - Abnormal; Notable for the following components:       Result Value   Potassium 3.1 (*)    Glucose, Bld 128 (*)    BUN 29 (*)    Creatinine, Ser 1.21 (*)    GFR, Estimated 45 (*)    All other components within normal limits  URINALYSIS, ROUTINE W REFLEX MICROSCOPIC - Abnormal; Notable for the following components:   APPearance HAZY (*)    Hgb urine dipstick MODERATE (*)    Ketones, ur 20 (*)    Nitrite POSITIVE (*)    Leukocytes,Ua MODERATE (*)    Bacteria, UA RARE (*)    All other components within normal limits  CBG MONITORING, ED - Abnormal; Notable for the following components:   Glucose-Capillary 126 (*)    All other components within normal limits  URINE CULTURE  CBC WITH DIFFERENTIAL/PLATELET    EKG None  Radiology CT Head Wo Contrast  Result Date: 02/15/2021 CLINICAL DATA:  Mental status change EXAM: CT HEAD WITHOUT CONTRAST TECHNIQUE: Contiguous axial images were obtained from the base of the skull through the vertex without intravenous contrast. COMPARISON:  Head CT 10/16/2018 FINDINGS: Brain: No acute intracranial hemorrhage. No focal mass lesion. No CT evidence of acute infarction. No midline shift or mass effect. No hydrocephalus. Basilar cisterns are patent. There are periventricular and subcortical white matter hypodensities. Generalized cortical atrophy. Vascular: No hyperdense vessel or unexpected calcification. Skull: Normal. Negative for fracture or focal lesion. Sinuses/Orbits: Paranasal sinuses and mastoid air cells are clear. Orbits are clear. Other: None. IMPRESSION: 1. No acute intracranial findings. 2. Atrophy and white matter microvascular disease. Electronically Signed   By: Suzy Bouchard M.D.   On: 02/15/2021 10:12   DG Chest Portable 1 View  Result Date: 02/15/2021 CLINICAL DATA:  Altered mental status EXAM: PORTABLE CHEST 1 VIEW COMPARISON:  04/15/2015 FINDINGS: Cardiac shadow is within normal limits. Stable aortic calcifications are seen. The lungs are well aerated bilaterally. No focal infiltrate or  sizable effusion is seen. No bony abnormality is noted. IMPRESSION: No acute abnormality seen. Electronically Signed   By: Inez Catalina M.D.   On: 02/15/2021 10:36    Procedures Procedures   Medications Ordered in ED Medications  cephALEXin (KEFLEX) capsule 500 mg (500 mg Oral Given 02/15/21 1401)    ED Course  I have reviewed the triage vital signs and the nursing notes.  Pertinent labs & imaging results that were available during my care of the patient were reviewed by me and considered in my medical decision making (see chart for details).    MDM Rules/Calculators/A&P                          Patient with mental status change.  Has been wandering around.  Urine showed potential infection.  Culture sent but will empirically treat at this point.  Discussed with patient's son.  Has had a gradual decline in really not that acute.  Thought to be secondary to dementia.  Patient feels that she cannot live at home anymore.  Discussed with transitions of care, who will be helping to try and find placement  Final Clinical Impression(s) / ED Diagnoses Final diagnoses:  Dementia with behavioral disturbance, unspecified dementia type ([place])  Acute cystitis without hematuria    Rx / DC Orders ED Discharge Orders    None       Davonna Belling, MD 02/15/21 [phone]

## 2021-02-15 NOTE — ED Triage Notes (Signed)
Pt arrived by EMS from home. Pt was found outside her house and bystander called EMS for her.  Pt states she was feeling shaky and off balance and was outside looking for someone to help her.  oriented to self and situations but not time  Pt denies pain at this time but states she feels off.    Hx of dementia, states since she has dementia she has trouble talking.

## 2021-02-15 NOTE — Evaluation (Signed)
Physical Therapy Evaluation Patient Details Name: Christine Washington MRN: [id number] DOB: 05-Jun-1936 Today's Date: 02/15/2021   History of Present Illness  Pt is an 85 y/o female presenting to the ED found wandering outside of home. PMH includes dementia, DM, CVA,  HTN, and OSA on CPAP.  Clinical Impression  Pt presenting secondary to problem above with deficits below. Pt unsteady initially, requiring min A for balance once standing. Min guard to min A for ambulation with HHA. Pt with memory deficits, and son reports this has gotten worse over the past couple of weeks. Pt requiring max multimodal cues to find room and could not remember room number despite repetition from PT. Per son, pt lives alone and does not have 24/7 support. Feel pt will require 24/7 at d/c and would likely benefit from memory care long term. Will continue to follow acutely.     Follow Up Recommendations SNF;Supervision/Assistance - 24 hour (memory care)    Equipment Recommendations  Rolling walker with 5" wheels    Recommendations for Other Services       Precautions / Restrictions Precautions Precautions: Fall Precaution Comments: Pt's son reports hx of multiple falls Restrictions Weight Bearing Restrictions: No      Mobility  Bed Mobility Overal bed mobility: Needs Assistance Bed Mobility: Supine to Sit;Sit to Supine     Supine to sit: Supervision Sit to supine: Supervision   General bed mobility comments: Supervision for safety. Increased time required.    Transfers Overall transfer level: Needs assistance Equipment used: None Transfers: Sit to/from Stand Sit to Stand: Min assist         General transfer comment: Min A for steadying assist to stand. Once standing, pt with posterior LOB as well.  Ambulation/Gait Ambulation/Gait assistance: Min assist;Min guard Gait Distance (Feet): 100 Feet Assistive device: 1 person hand held assist Gait Pattern/deviations: Step-through pattern;Decreased  stride length Gait velocity: Decreased   General Gait Details: Slow, cautious gait. Min to min guard A for steadying. Pt requiring max multimodal cues to find room. could not remember room number despite telling pt multiple times.  Stairs            Wheelchair Mobility    Modified Rankin (Stroke Patients Only)       Balance Overall balance assessment: Needs assistance Sitting-balance support: No upper extremity supported;Feet supported Sitting balance-Leahy Scale: Fair     Standing balance support: Single extremity supported;During functional activity Standing balance-Leahy Scale: Poor Standing balance comment: Reliant on at least 1 UE support                             Pertinent Vitals/Pain Pain Assessment: No/denies pain    Home Living Family/patient expects to be discharged to:: Private residence Living Arrangements: Alone Available Help at Discharge: Family;Available PRN/intermittently Type of Home: House Home Access: Ramped entrance     Home Layout: One level Home Equipment: Clinical cytogeneticist - 2 wheels;Cane - single point Additional Comments: Aide was comin in 3 days/week for a couple hours.    Prior Function Level of Independence: Needs assistance   Gait / Transfers Assistance Needed: normally independent ambulation  ADL's / Homemaking Assistance Needed: aide helps with IADLs; able to bathe and dress independently        Hand Dominance        Extremity/Trunk Assessment   Upper Extremity Assessment Upper Extremity Assessment: Generalized weakness    Lower Extremity Assessment Lower Extremity Assessment: Generalized weakness  Cervical / Trunk Assessment Cervical / Trunk Assessment: Kyphotic  Communication   Communication: No difficulties  Cognition Arousal/Alertness: Awake/alert Behavior During Therapy: WFL for tasks assessed/performed Overall Cognitive Status: Impaired/Different from baseline Area of Impairment:  Orientation;Memory;Safety/judgement;Awareness;Problem solving                 Orientation Level: Disoriented to;Place;Time;Situation   Memory: Decreased short-term memory;Decreased recall of precautions   Safety/Judgement: Decreased awareness of safety;Decreased awareness of deficits Awareness: Intellectual Problem Solving: Slow processing General Comments: Pt with dementia at baseline. Per son, has worsened over last couple of weeks. Did not remember her birthday and was disoriented to place, time, and situation. Pt could not remember room number, despite max cues; required directions from PT to find room.      General Comments      Exercises     Assessment/Plan    PT Assessment Patient needs continued PT services  PT Problem List Decreased strength;Decreased activity tolerance;Decreased balance;Decreased mobility;Decreased knowledge of use of DME;Decreased knowledge of precautions;Decreased cognition;Decreased safety awareness       PT Treatment Interventions DME instruction;Gait training;Stair training;Functional mobility training;Therapeutic activities;Therapeutic exercise;Balance training;Cognitive remediation;Patient/family education    PT Goals (Current goals can be found in the Care Plan section)  Acute Rehab PT Goals Patient Stated Goal: to go home PT Goal Formulation: With patient Time For Goal Achievement: 03/01/21 Potential to Achieve Goals: Fair    Frequency Min 2X/week   Barriers to discharge Decreased caregiver support      Co-evaluation               AM-PAC PT "6 Clicks" Mobility  Outcome Measure Help needed turning from your back to your side while in a flat bed without using bedrails?: None Help needed moving from lying on your back to sitting on the side of a flat bed without using bedrails?: A Little Help needed moving to and from a bed to a chair (including a wheelchair)?: A Little Help needed standing up from a chair using your arms  (e.g., wheelchair or bedside chair)?: A Little Help needed to walk in hospital room?: A Little Help needed climbing 3-5 steps with a railing? : A Lot 6 Click Score: 18    End of Session Equipment Utilized During Treatment: Gait belt Activity Tolerance: Patient tolerated treatment well Patient left: in bed;with call bell/phone within reach;with family/visitor present (on stretcher in ED) Nurse Communication: Mobility status PT Visit Diagnosis: Unsteadiness on feet (R26.81);History of falling (Z91.81);Repeated falls (R29.6)    Time: 1540-0867 PT Time Calculation (min) (ACUTE ONLY): 28 min   Charges:   PT Evaluation $PT Eval Moderate Complexity: 1 Mod PT Treatments $Gait Training: 8-22 mins        Lou Miner, DPT  Acute Rehabilitation Services  Pager: [phone] Office: [phone]   Rudean Hitt 02/15/2021, 3:42 PM

## 2021-02-15 NOTE — ED Provider Notes (Signed)
I received the patient in signout from Dr. Alvino Chapel.  Briefly the patient is a 85 year old female with a progressive cognitive decline who is not doing well at home and needs placement.  Found to have a possible UTI with positive nitrites though rare bacteria.  Plan to start on oral antibiotics and have social work evaluate for placement.   Deno Etienne, DO 02/15/21 1559

## 2021-02-15 NOTE — ED Notes (Signed)
Pt continuously attempting to get out of bed, appears confusion and stating she has to go home because she can not stay her. Pt difficult to redirect, this RN and NT able to get pt back in bed, repositioned and warm blankets provided, Bed rail up x2 and pt is directly across from nursing station. For direct observation

## 2021-02-15 NOTE — Social Work (Addendum)
CSW met with Pt and son, Merry Proud at bedside. CSW gathered information for placement at SNF.  CSW explained SNF process as well as Ship broker for SNF.  CSW also gave [facility] resources.

## 2021-02-16 MED ORDER — POTASSIUM CHLORIDE CRYS ER 20 MEQ PO TBCR
40.0000 meq | EXTENDED_RELEASE_TABLET | Freq: Once | ORAL | Status: AC
  Administered 2021-02-16: 40 meq via ORAL
  Filled 2021-02-16: qty 2

## 2021-02-16 MED ORDER — CEPHALEXIN 500 MG PO CAPS: 500.0000 mg | ORAL_CAPSULE | Freq: Three times a day (TID) | ORAL | 0 refills | Status: AC

## 2021-02-16 NOTE — ED Notes (Signed)
Pt  Attempting  to get out bed. Pt easily redirected to remain in bed.

## 2021-02-16 NOTE — ED Notes (Signed)
Pt's son is on the way here to transport pt to [facility] room 228-B. Pt's other son at bedside at this time. Pt resting quietly in bed. Discharge instructions and prescription reviewed with pt's son.

## 2021-02-16 NOTE — Progress Notes (Signed)
Patient was offered a bed at [facility]. CSW spoke with Christine Washington who told CSW she would call her back with a bed number. CSW informed patient and patients son Christine Washington who is at bedside.

## 2021-02-16 NOTE — Progress Notes (Signed)
Patient received a bed offer at [facility] through the hub. CSW contacted the facility and was told by admissions that she wasn't aware of patients name and would get back to CSW. CSW asked if that was going to be today and CSW was told "probably

## 2021-02-16 NOTE — ED Notes (Signed)
Pt up and and eating breakfast. No needs from pt at this time.Marland Kitchen

## 2021-02-16 NOTE — ED Provider Notes (Signed)
I was notified that this patient has received placement at [facility].  I reviewed chart and, in short, patient is an 85 year old female brought into the ED for progressive decline who was found to have a UTI.  Plan was for SNF placement and initiation of oral antibiotics.  Patient's vital signs have been largely reassuring and she has been afebrile.  She has been treated with Keflex.  We will continue outpatient Keflex treatment.  Culture report is still trending.  She was also noted to be hypokalemic to 3.1, replenished with 40 mEq K-Dur.  She will need laboratory recheck in a few days to ensure correction of her electrolyte derangements.     Corena Herter, PA-C 02/16/21 1231    Lennice Sites, DO 02/16/21 1407

## 2021-02-16 NOTE — ED Notes (Signed)
Lunch Tray Ordered @ 1004. 

## 2021-02-16 NOTE — ED Notes (Signed)
Pt removed pure wick and urinated on floor at bedside, this RN able to redirect pt and assisted pt to bed and reposition pt, new pure wick placed, fresh linens applied.

## 2021-02-16 NOTE — ED Notes (Signed)
Placement Breakfast order placed

## 2021-02-16 NOTE — ED Notes (Signed)
Pt placed on 1L of O2 via [place], desat 87%RA while resting, noted sleep apnea.

## 2021-02-16 NOTE — Discharge Instructions (Signed)
You have been accepted for placement at [facility].  Please continue to take the Keflex antibiotics for your urinary tract infection.  The prescription is printed and attached.  Please notify your primary care provider regarding today's encounter and for ongoing evaluation and management.  You had slightly low potassium here in the ED you will need to have your labs rechecked in a couple of days to ensure correction of your electrolyte derangement.  We have also drawn a urine culture and I would like for your primary care provider to follow-up on these findings to ensure that you are being treated with an appropriate antibiotic.  Return to the ED or seek immediate medical attention should you experience any new or worsening symptoms.

## 2021-02-16 NOTE — Progress Notes (Signed)
Patient will be going to room 228 B at [facility].

## 2021-02-16 NOTE — ED Notes (Signed)
Pts family member at bedside

## 2021-02-16 NOTE — ED Notes (Signed)
Pt up out of bed removed cardiac leads and pure wick, this RN redirected pt to bed, repositioned pt, replaced pure wick and placed bed rails upx2 and call bell in reach.

## 2021-02-17 LAB — URINE CULTURE: Culture: >=100000 — AB

## 2021-03-11 ENCOUNTER — Ambulatory Visit: Payer: Medicare Other | Admitting: Neurology

## 2021-04-29 ENCOUNTER — Ambulatory Visit: Payer: Medicare Other | Admitting: Neurology

## 2021-05-01 ENCOUNTER — Other Ambulatory Visit: Payer: Self-pay | Admitting: Family Medicine

## 2021-05-14 ENCOUNTER — Other Ambulatory Visit: Payer: Self-pay

## 2021-05-14 ENCOUNTER — Ambulatory Visit (INDEPENDENT_AMBULATORY_CARE_PROVIDER_SITE_OTHER): Payer: Medicare Other | Admitting: Podiatry

## 2021-05-14 ENCOUNTER — Encounter: Payer: Self-pay | Admitting: Podiatry

## 2021-05-14 ENCOUNTER — Ambulatory Visit: Payer: Medicare Other | Admitting: Podiatry

## 2021-05-14 DIAGNOSIS — M79674 Pain in right toe(s): Secondary | ICD-10-CM

## 2021-05-14 DIAGNOSIS — E1142 Type 2 diabetes mellitus with diabetic polyneuropathy: Secondary | ICD-10-CM | POA: Diagnosis not present

## 2021-05-14 DIAGNOSIS — Q828 Other specified congenital malformations of skin: Secondary | ICD-10-CM

## 2021-05-14 DIAGNOSIS — M79675 Pain in left toe(s): Secondary | ICD-10-CM | POA: Diagnosis not present

## 2021-05-14 DIAGNOSIS — B351 Tinea unguium: Secondary | ICD-10-CM

## 2021-05-15 NOTE — Progress Notes (Signed)
Subjective: 85 year old female presents the office today for concerns of thick, discolored toenails that she cannot trim her self as well as her painful calluses.  She presents today with a caregiver. Denies any systemic complaints such as fevers, chills, nausea, vomiting. No acute changes since last appointment, and no other complaints at this time.   A1c 10/30/2020 was 5.6 McGowen, Adrian Blackwater, MD last seen October 30, 2020  Objective: NAD DP/PT pulses palpable bilaterally, CRT less than 3 seconds Sensation decreased with Semmes Weinstein monofilament Nails are hypertrophic, dystrophic, brittle, discolored, elongated 10. No surrounding redness or drainage. Tenderness nails 1-5 bilaterally.  Thick hyperkeratotic lesions present in left heel, submetatarsal x2, submetatarsal x2.  Upon debridement there is no ongoing ulceration drainage or signs of infection. No pain with calf compression, swelling, warmth, erythema  Assessment: Symptomatic onychomycosis, painful hyperkeratotic lesions  Plan: -All treatment options discussed with the patient including all alternatives, risks, complications.  -Nails were sharply debrided x10 without any complications or bleeding -Hyperkeratotic lesion sharply debrided x6 without any complications or bleeding -Patient encouraged to call the office with any questions, concerns, change in symptoms.   Trula Slade DPM

## 2021-05-18 ENCOUNTER — Other Ambulatory Visit: Payer: Self-pay | Admitting: Family Medicine

## 2021-08-20 ENCOUNTER — Ambulatory Visit: Payer: Medicare Other | Admitting: Podiatry

## 2021-10-15 ENCOUNTER — Ambulatory Visit (INDEPENDENT_AMBULATORY_CARE_PROVIDER_SITE_OTHER): Payer: Medicare Other | Admitting: Podiatry

## 2021-10-15 ENCOUNTER — Other Ambulatory Visit: Payer: Self-pay

## 2021-10-15 ENCOUNTER — Encounter: Payer: Self-pay | Admitting: Podiatry

## 2021-10-15 DIAGNOSIS — M79674 Pain in right toe(s): Secondary | ICD-10-CM | POA: Diagnosis not present

## 2021-10-15 DIAGNOSIS — B351 Tinea unguium: Secondary | ICD-10-CM

## 2021-10-15 DIAGNOSIS — M79675 Pain in left toe(s): Secondary | ICD-10-CM | POA: Diagnosis not present

## 2021-10-15 DIAGNOSIS — E1142 Type 2 diabetes mellitus with diabetic polyneuropathy: Secondary | ICD-10-CM

## 2021-10-15 DIAGNOSIS — Q828 Other specified congenital malformations of skin: Secondary | ICD-10-CM

## 2021-10-15 NOTE — Progress Notes (Signed)
Subjective: 85 year old female presents the office today for concerns of thick, discolored toenails that she cannot trim her self as well as her painful calluses.  She presents today with a caregiver. Denies any systemic complaints such as fevers, chills, nausea, vomiting. No acute changes since last appointment, and no other complaints at this time.   A1c 10/30/2020 was 5.6 McGowen, Adrian Blackwater, MD last seen October 30, 2020  Objective: NAD DP/PT pulses palpable bilaterally, CRT less than 3 seconds Sensation decreased with Semmes Weinstein monofilament Nails are hypertrophic, dystrophic, brittle, discolored, elongated 10. No surrounding redness or drainage. Tenderness nails 1-5 bilaterally.  Thick hyperkeratotic lesions present in left heel, submetatarsal x2, submetatarsal x2.  Upon debridement there is no ongoing ulceration drainage or signs of infection. No pain with calf compression, swelling, warmth, erythema  Assessment: Symptomatic onychomycosis, painful hyperkeratotic lesions  Plan: -All treatment options discussed with the patient including all alternatives, risks, complications.  -Nails were sharply debrided x10 without any complications or bleeding -Hyperkeratotic lesion sharply debrided x2 without any complications or bleeding -Patient encouraged to call the office with any questions, concerns, change in symptoms.   Lorenda Peck DPM

## 2022-01-19 DEATH — deceased

## 2022-01-21 ENCOUNTER — Ambulatory Visit (INDEPENDENT_AMBULATORY_CARE_PROVIDER_SITE_OTHER): Payer: Medicare Other | Admitting: Podiatry

## 2022-01-21 DIAGNOSIS — Z91199 Patient's noncompliance with other medical treatment and regimen due to unspecified reason: Secondary | ICD-10-CM

## 2022-01-21 NOTE — Progress Notes (Signed)
No show

## 2023-02-03 ENCOUNTER — Telehealth: Payer: Self-pay

## 2023-02-03 NOTE — Telephone Encounter (Signed)
Patient son was contacted and he informed that patient passed away 2022-01-23.
